# Patient Record
Sex: Male | Born: 1940
Health system: Southern US, Community
[De-identification: ages and names within clinical notes are randomized; demographics above are authoritative.]

## PROBLEM LIST (undated history)

## (undated) DIAGNOSIS — I10 Essential (primary) hypertension: Secondary | ICD-10-CM

## (undated) DIAGNOSIS — E785 Hyperlipidemia, unspecified: Secondary | ICD-10-CM

## (undated) DIAGNOSIS — E538 Deficiency of other specified B group vitamins: Secondary | ICD-10-CM

## (undated) DIAGNOSIS — K219 Gastro-esophageal reflux disease without esophagitis: Secondary | ICD-10-CM

## (undated) DIAGNOSIS — N289 Disorder of kidney and ureter, unspecified: Secondary | ICD-10-CM

## (undated) DIAGNOSIS — N401 Enlarged prostate with lower urinary tract symptoms: Secondary | ICD-10-CM

## (undated) DIAGNOSIS — J302 Other seasonal allergic rhinitis: Secondary | ICD-10-CM

## (undated) DIAGNOSIS — G4730 Sleep apnea, unspecified: Secondary | ICD-10-CM

## (undated) DIAGNOSIS — D510 Vitamin B12 deficiency anemia due to intrinsic factor deficiency: Secondary | ICD-10-CM

## (undated) DIAGNOSIS — R0600 Dyspnea, unspecified: Secondary | ICD-10-CM

## (undated) DIAGNOSIS — D6489 Other specified anemias: Secondary | ICD-10-CM

## (undated) DIAGNOSIS — N138 Other obstructive and reflux uropathy: Secondary | ICD-10-CM

## (undated) DIAGNOSIS — R0609 Other forms of dyspnea: Secondary | ICD-10-CM

## (undated) HISTORY — DX: Vitamin B12 deficiency anemia due to intrinsic factor deficiency: D51.0

## (undated) HISTORY — DX: Benign prostatic hyperplasia with lower urinary tract symptoms: N13.8

## (undated) HISTORY — PX: MULTIPLE TOOTH EXTRACTIONS: SHX2053

## (undated) HISTORY — DX: Other obstructive and reflux uropathy: N40.1

## (undated) HISTORY — DX: Hyperlipidemia, unspecified: E78.5

## (undated) HISTORY — DX: Other seasonal allergic rhinitis: J30.2

## (undated) HISTORY — DX: Other forms of dyspnea: R06.09

## (undated) HISTORY — PX: HERNIA REPAIR: SHX51

## (undated) HISTORY — DX: Sleep apnea, unspecified: G47.30

## (undated) HISTORY — DX: Essential (primary) hypertension: I10

## (undated) HISTORY — PX: REPLACEMENT TOTAL KNEE: SUR1224

## (undated) HISTORY — DX: Other specified anemias: D64.89

## (undated) HISTORY — DX: Gastro-esophageal reflux disease without esophagitis: K21.9

## (undated) HISTORY — DX: Disorder of kidney and ureter, unspecified: N28.9

## (undated) HISTORY — DX: Dyspnea, unspecified: R06.00

## (undated) HISTORY — DX: Deficiency of other specified B group vitamins: E53.8

## (undated) HISTORY — PX: EYE SURGERY: SHX253

---

## 2011-12-28 DIAGNOSIS — D510 Vitamin B12 deficiency anemia due to intrinsic factor deficiency: Secondary | ICD-10-CM | POA: Diagnosis not present

## 2012-01-02 DIAGNOSIS — D509 Iron deficiency anemia, unspecified: Secondary | ICD-10-CM | POA: Diagnosis not present

## 2012-01-04 DIAGNOSIS — K2901 Acute gastritis with bleeding: Secondary | ICD-10-CM | POA: Diagnosis not present

## 2012-01-04 DIAGNOSIS — K589 Irritable bowel syndrome without diarrhea: Secondary | ICD-10-CM | POA: Diagnosis not present

## 2012-01-04 DIAGNOSIS — R1084 Generalized abdominal pain: Secondary | ICD-10-CM | POA: Diagnosis not present

## 2012-01-04 DIAGNOSIS — D509 Iron deficiency anemia, unspecified: Secondary | ICD-10-CM | POA: Diagnosis not present

## 2012-01-09 DIAGNOSIS — Z01818 Encounter for other preprocedural examination: Secondary | ICD-10-CM | POA: Diagnosis not present

## 2012-01-09 DIAGNOSIS — Z0389 Encounter for observation for other suspected diseases and conditions ruled out: Secondary | ICD-10-CM | POA: Diagnosis not present

## 2012-01-16 DIAGNOSIS — Z01811 Encounter for preprocedural respiratory examination: Secondary | ICD-10-CM | POA: Diagnosis not present

## 2012-01-16 DIAGNOSIS — Z23 Encounter for immunization: Secondary | ICD-10-CM | POA: Diagnosis not present

## 2012-01-16 DIAGNOSIS — Z01810 Encounter for preprocedural cardiovascular examination: Secondary | ICD-10-CM | POA: Diagnosis not present

## 2012-01-18 DIAGNOSIS — D509 Iron deficiency anemia, unspecified: Secondary | ICD-10-CM | POA: Diagnosis not present

## 2012-01-18 DIAGNOSIS — R195 Other fecal abnormalities: Secondary | ICD-10-CM | POA: Diagnosis not present

## 2012-01-27 DIAGNOSIS — D539 Nutritional anemia, unspecified: Secondary | ICD-10-CM | POA: Diagnosis not present

## 2012-02-06 DIAGNOSIS — H2510 Age-related nuclear cataract, unspecified eye: Secondary | ICD-10-CM | POA: Diagnosis not present

## 2012-02-16 DIAGNOSIS — I10 Essential (primary) hypertension: Secondary | ICD-10-CM | POA: Diagnosis not present

## 2012-02-16 DIAGNOSIS — E785 Hyperlipidemia, unspecified: Secondary | ICD-10-CM | POA: Diagnosis not present

## 2012-02-16 DIAGNOSIS — Z01810 Encounter for preprocedural cardiovascular examination: Secondary | ICD-10-CM | POA: Diagnosis not present

## 2012-02-21 DIAGNOSIS — I10 Essential (primary) hypertension: Secondary | ICD-10-CM | POA: Diagnosis not present

## 2012-02-21 DIAGNOSIS — G4733 Obstructive sleep apnea (adult) (pediatric): Secondary | ICD-10-CM | POA: Diagnosis not present

## 2012-02-22 DIAGNOSIS — G4733 Obstructive sleep apnea (adult) (pediatric): Secondary | ICD-10-CM | POA: Diagnosis not present

## 2012-02-24 DIAGNOSIS — D539 Nutritional anemia, unspecified: Secondary | ICD-10-CM | POA: Diagnosis not present

## 2012-04-04 DIAGNOSIS — I119 Hypertensive heart disease without heart failure: Secondary | ICD-10-CM | POA: Diagnosis not present

## 2012-04-04 DIAGNOSIS — Z125 Encounter for screening for malignant neoplasm of prostate: Secondary | ICD-10-CM | POA: Diagnosis not present

## 2012-04-04 DIAGNOSIS — E782 Mixed hyperlipidemia: Secondary | ICD-10-CM | POA: Diagnosis not present

## 2012-04-04 DIAGNOSIS — Z79899 Other long term (current) drug therapy: Secondary | ICD-10-CM | POA: Diagnosis not present

## 2012-04-04 DIAGNOSIS — D510 Vitamin B12 deficiency anemia due to intrinsic factor deficiency: Secondary | ICD-10-CM | POA: Diagnosis not present

## 2012-04-06 DIAGNOSIS — Z1211 Encounter for screening for malignant neoplasm of colon: Secondary | ICD-10-CM | POA: Diagnosis not present

## 2012-04-10 DIAGNOSIS — D649 Anemia, unspecified: Secondary | ICD-10-CM | POA: Diagnosis not present

## 2012-04-10 DIAGNOSIS — N039 Chronic nephritic syndrome with unspecified morphologic changes: Secondary | ICD-10-CM | POA: Diagnosis not present

## 2012-04-10 DIAGNOSIS — E785 Hyperlipidemia, unspecified: Secondary | ICD-10-CM | POA: Diagnosis not present

## 2012-04-10 DIAGNOSIS — G8918 Other acute postprocedural pain: Secondary | ICD-10-CM | POA: Diagnosis not present

## 2012-04-10 DIAGNOSIS — M1710 Unilateral primary osteoarthritis, unspecified knee: Secondary | ICD-10-CM | POA: Diagnosis not present

## 2012-04-10 DIAGNOSIS — I10 Essential (primary) hypertension: Secondary | ICD-10-CM | POA: Diagnosis not present

## 2012-04-10 DIAGNOSIS — G4733 Obstructive sleep apnea (adult) (pediatric): Secondary | ICD-10-CM | POA: Diagnosis present

## 2012-04-10 DIAGNOSIS — N401 Enlarged prostate with lower urinary tract symptoms: Secondary | ICD-10-CM | POA: Diagnosis not present

## 2012-04-10 DIAGNOSIS — N189 Chronic kidney disease, unspecified: Secondary | ICD-10-CM | POA: Diagnosis present

## 2012-04-10 DIAGNOSIS — N138 Other obstructive and reflux uropathy: Secondary | ICD-10-CM | POA: Diagnosis present

## 2012-04-10 DIAGNOSIS — G8929 Other chronic pain: Secondary | ICD-10-CM | POA: Diagnosis present

## 2012-04-10 DIAGNOSIS — T819XXA Unspecified complication of procedure, initial encounter: Secondary | ICD-10-CM | POA: Diagnosis not present

## 2012-04-10 DIAGNOSIS — I129 Hypertensive chronic kidney disease with stage 1 through stage 4 chronic kidney disease, or unspecified chronic kidney disease: Secondary | ICD-10-CM | POA: Diagnosis present

## 2012-04-10 DIAGNOSIS — E7800 Pure hypercholesterolemia, unspecified: Secondary | ICD-10-CM | POA: Diagnosis not present

## 2012-04-10 DIAGNOSIS — R109 Unspecified abdominal pain: Secondary | ICD-10-CM | POA: Diagnosis present

## 2012-04-10 DIAGNOSIS — R339 Retention of urine, unspecified: Secondary | ICD-10-CM | POA: Diagnosis not present

## 2012-04-10 DIAGNOSIS — K219 Gastro-esophageal reflux disease without esophagitis: Secondary | ICD-10-CM | POA: Diagnosis not present

## 2012-04-10 DIAGNOSIS — N182 Chronic kidney disease, stage 2 (mild): Secondary | ICD-10-CM | POA: Diagnosis not present

## 2012-04-10 DIAGNOSIS — M25569 Pain in unspecified knee: Secondary | ICD-10-CM | POA: Diagnosis not present

## 2012-04-10 DIAGNOSIS — D631 Anemia in chronic kidney disease: Secondary | ICD-10-CM | POA: Diagnosis not present

## 2012-04-13 DIAGNOSIS — I10 Essential (primary) hypertension: Secondary | ICD-10-CM | POA: Diagnosis not present

## 2012-04-13 DIAGNOSIS — Z471 Aftercare following joint replacement surgery: Secondary | ICD-10-CM | POA: Diagnosis not present

## 2012-04-13 DIAGNOSIS — R269 Unspecified abnormalities of gait and mobility: Secondary | ICD-10-CM | POA: Diagnosis not present

## 2012-04-13 DIAGNOSIS — R339 Retention of urine, unspecified: Secondary | ICD-10-CM | POA: Diagnosis not present

## 2012-04-13 DIAGNOSIS — Z96659 Presence of unspecified artificial knee joint: Secondary | ICD-10-CM | POA: Diagnosis not present

## 2012-04-14 DIAGNOSIS — Z96659 Presence of unspecified artificial knee joint: Secondary | ICD-10-CM | POA: Diagnosis not present

## 2012-04-14 DIAGNOSIS — I10 Essential (primary) hypertension: Secondary | ICD-10-CM | POA: Diagnosis not present

## 2012-04-14 DIAGNOSIS — R339 Retention of urine, unspecified: Secondary | ICD-10-CM | POA: Diagnosis not present

## 2012-04-14 DIAGNOSIS — Z471 Aftercare following joint replacement surgery: Secondary | ICD-10-CM | POA: Diagnosis not present

## 2012-04-14 DIAGNOSIS — R269 Unspecified abnormalities of gait and mobility: Secondary | ICD-10-CM | POA: Diagnosis not present

## 2012-04-16 DIAGNOSIS — I10 Essential (primary) hypertension: Secondary | ICD-10-CM | POA: Diagnosis not present

## 2012-04-16 DIAGNOSIS — R269 Unspecified abnormalities of gait and mobility: Secondary | ICD-10-CM | POA: Diagnosis not present

## 2012-04-16 DIAGNOSIS — Z96659 Presence of unspecified artificial knee joint: Secondary | ICD-10-CM | POA: Diagnosis not present

## 2012-04-16 DIAGNOSIS — Z471 Aftercare following joint replacement surgery: Secondary | ICD-10-CM | POA: Diagnosis not present

## 2012-04-16 DIAGNOSIS — R339 Retention of urine, unspecified: Secondary | ICD-10-CM | POA: Diagnosis not present

## 2012-04-18 DIAGNOSIS — I10 Essential (primary) hypertension: Secondary | ICD-10-CM | POA: Diagnosis not present

## 2012-04-18 DIAGNOSIS — R339 Retention of urine, unspecified: Secondary | ICD-10-CM | POA: Diagnosis not present

## 2012-04-18 DIAGNOSIS — R269 Unspecified abnormalities of gait and mobility: Secondary | ICD-10-CM | POA: Diagnosis not present

## 2012-04-18 DIAGNOSIS — Z471 Aftercare following joint replacement surgery: Secondary | ICD-10-CM | POA: Diagnosis not present

## 2012-04-18 DIAGNOSIS — Z96659 Presence of unspecified artificial knee joint: Secondary | ICD-10-CM | POA: Diagnosis not present

## 2012-04-19 DIAGNOSIS — R339 Retention of urine, unspecified: Secondary | ICD-10-CM | POA: Diagnosis not present

## 2012-04-19 DIAGNOSIS — Z471 Aftercare following joint replacement surgery: Secondary | ICD-10-CM | POA: Diagnosis not present

## 2012-04-19 DIAGNOSIS — I10 Essential (primary) hypertension: Secondary | ICD-10-CM | POA: Diagnosis not present

## 2012-04-19 DIAGNOSIS — N318 Other neuromuscular dysfunction of bladder: Secondary | ICD-10-CM | POA: Diagnosis not present

## 2012-04-19 DIAGNOSIS — N390 Urinary tract infection, site not specified: Secondary | ICD-10-CM | POA: Diagnosis not present

## 2012-04-19 DIAGNOSIS — N401 Enlarged prostate with lower urinary tract symptoms: Secondary | ICD-10-CM | POA: Diagnosis not present

## 2012-04-19 DIAGNOSIS — R269 Unspecified abnormalities of gait and mobility: Secondary | ICD-10-CM | POA: Diagnosis not present

## 2012-04-19 DIAGNOSIS — Z96659 Presence of unspecified artificial knee joint: Secondary | ICD-10-CM | POA: Diagnosis not present

## 2012-04-20 DIAGNOSIS — I10 Essential (primary) hypertension: Secondary | ICD-10-CM | POA: Diagnosis not present

## 2012-04-20 DIAGNOSIS — R339 Retention of urine, unspecified: Secondary | ICD-10-CM | POA: Diagnosis not present

## 2012-04-20 DIAGNOSIS — Z471 Aftercare following joint replacement surgery: Secondary | ICD-10-CM | POA: Diagnosis not present

## 2012-04-20 DIAGNOSIS — R269 Unspecified abnormalities of gait and mobility: Secondary | ICD-10-CM | POA: Diagnosis not present

## 2012-04-20 DIAGNOSIS — Z96659 Presence of unspecified artificial knee joint: Secondary | ICD-10-CM | POA: Diagnosis not present

## 2012-04-23 DIAGNOSIS — M25569 Pain in unspecified knee: Secondary | ICD-10-CM | POA: Diagnosis not present

## 2012-04-25 DIAGNOSIS — M25569 Pain in unspecified knee: Secondary | ICD-10-CM | POA: Diagnosis not present

## 2012-04-26 DIAGNOSIS — R339 Retention of urine, unspecified: Secondary | ICD-10-CM | POA: Diagnosis not present

## 2012-04-26 DIAGNOSIS — N401 Enlarged prostate with lower urinary tract symptoms: Secondary | ICD-10-CM | POA: Diagnosis not present

## 2012-04-26 DIAGNOSIS — N390 Urinary tract infection, site not specified: Secondary | ICD-10-CM | POA: Diagnosis not present

## 2012-04-26 DIAGNOSIS — D510 Vitamin B12 deficiency anemia due to intrinsic factor deficiency: Secondary | ICD-10-CM | POA: Diagnosis not present

## 2012-04-26 DIAGNOSIS — N318 Other neuromuscular dysfunction of bladder: Secondary | ICD-10-CM | POA: Diagnosis not present

## 2012-04-27 DIAGNOSIS — M25569 Pain in unspecified knee: Secondary | ICD-10-CM | POA: Diagnosis not present

## 2012-04-29 DIAGNOSIS — N390 Urinary tract infection, site not specified: Secondary | ICD-10-CM | POA: Diagnosis not present

## 2012-05-01 DIAGNOSIS — M25569 Pain in unspecified knee: Secondary | ICD-10-CM | POA: Diagnosis not present

## 2012-05-02 DIAGNOSIS — M25569 Pain in unspecified knee: Secondary | ICD-10-CM | POA: Diagnosis not present

## 2012-05-04 DIAGNOSIS — N390 Urinary tract infection, site not specified: Secondary | ICD-10-CM | POA: Diagnosis not present

## 2012-05-04 DIAGNOSIS — R339 Retention of urine, unspecified: Secondary | ICD-10-CM | POA: Diagnosis not present

## 2012-05-04 DIAGNOSIS — N401 Enlarged prostate with lower urinary tract symptoms: Secondary | ICD-10-CM | POA: Diagnosis not present

## 2012-05-04 DIAGNOSIS — M25569 Pain in unspecified knee: Secondary | ICD-10-CM | POA: Diagnosis not present

## 2012-05-04 DIAGNOSIS — N318 Other neuromuscular dysfunction of bladder: Secondary | ICD-10-CM | POA: Diagnosis not present

## 2012-05-07 DIAGNOSIS — M25569 Pain in unspecified knee: Secondary | ICD-10-CM | POA: Diagnosis not present

## 2012-05-08 DIAGNOSIS — I209 Angina pectoris, unspecified: Secondary | ICD-10-CM | POA: Diagnosis not present

## 2012-05-08 DIAGNOSIS — I5030 Unspecified diastolic (congestive) heart failure: Secondary | ICD-10-CM | POA: Diagnosis not present

## 2012-05-08 DIAGNOSIS — N400 Enlarged prostate without lower urinary tract symptoms: Secondary | ICD-10-CM | POA: Diagnosis not present

## 2012-05-08 DIAGNOSIS — D649 Anemia, unspecified: Secondary | ICD-10-CM | POA: Diagnosis not present

## 2012-05-08 DIAGNOSIS — Z0389 Encounter for observation for other suspected diseases and conditions ruled out: Secondary | ICD-10-CM | POA: Diagnosis not present

## 2012-05-08 DIAGNOSIS — Z96659 Presence of unspecified artificial knee joint: Secondary | ICD-10-CM | POA: Diagnosis not present

## 2012-05-08 DIAGNOSIS — I130 Hypertensive heart and chronic kidney disease with heart failure and stage 1 through stage 4 chronic kidney disease, or unspecified chronic kidney disease: Secondary | ICD-10-CM | POA: Diagnosis not present

## 2012-05-08 DIAGNOSIS — R0989 Other specified symptoms and signs involving the circulatory and respiratory systems: Secondary | ICD-10-CM | POA: Diagnosis not present

## 2012-05-08 DIAGNOSIS — R0602 Shortness of breath: Secondary | ICD-10-CM | POA: Diagnosis not present

## 2012-05-08 DIAGNOSIS — I129 Hypertensive chronic kidney disease with stage 1 through stage 4 chronic kidney disease, or unspecified chronic kidney disease: Secondary | ICD-10-CM | POA: Diagnosis not present

## 2012-05-08 DIAGNOSIS — R079 Chest pain, unspecified: Secondary | ICD-10-CM | POA: Diagnosis not present

## 2012-05-08 DIAGNOSIS — K219 Gastro-esophageal reflux disease without esophagitis: Secondary | ICD-10-CM | POA: Diagnosis not present

## 2012-05-08 DIAGNOSIS — N189 Chronic kidney disease, unspecified: Secondary | ICD-10-CM | POA: Diagnosis not present

## 2012-05-08 DIAGNOSIS — N179 Acute kidney failure, unspecified: Secondary | ICD-10-CM | POA: Diagnosis not present

## 2012-05-08 DIAGNOSIS — I059 Rheumatic mitral valve disease, unspecified: Secondary | ICD-10-CM | POA: Diagnosis not present

## 2012-05-08 DIAGNOSIS — E785 Hyperlipidemia, unspecified: Secondary | ICD-10-CM | POA: Diagnosis not present

## 2012-05-08 DIAGNOSIS — G4730 Sleep apnea, unspecified: Secondary | ICD-10-CM | POA: Diagnosis not present

## 2012-05-08 DIAGNOSIS — I509 Heart failure, unspecified: Secondary | ICD-10-CM | POA: Diagnosis not present

## 2012-05-08 DIAGNOSIS — I079 Rheumatic tricuspid valve disease, unspecified: Secondary | ICD-10-CM | POA: Diagnosis not present

## 2012-05-08 DIAGNOSIS — R000 Tachycardia, unspecified: Secondary | ICD-10-CM | POA: Diagnosis not present

## 2012-05-09 DIAGNOSIS — Z0389 Encounter for observation for other suspected diseases and conditions ruled out: Secondary | ICD-10-CM | POA: Diagnosis not present

## 2012-05-09 DIAGNOSIS — R079 Chest pain, unspecified: Secondary | ICD-10-CM | POA: Diagnosis not present

## 2012-05-09 DIAGNOSIS — I219 Acute myocardial infarction, unspecified: Secondary | ICD-10-CM | POA: Diagnosis not present

## 2012-05-09 DIAGNOSIS — N039 Chronic nephritic syndrome with unspecified morphologic changes: Secondary | ICD-10-CM | POA: Diagnosis not present

## 2012-05-09 DIAGNOSIS — N179 Acute kidney failure, unspecified: Secondary | ICD-10-CM | POA: Diagnosis not present

## 2012-05-09 DIAGNOSIS — I359 Nonrheumatic aortic valve disorder, unspecified: Secondary | ICD-10-CM | POA: Diagnosis not present

## 2012-05-09 DIAGNOSIS — E785 Hyperlipidemia, unspecified: Secondary | ICD-10-CM | POA: Diagnosis not present

## 2012-05-10 DIAGNOSIS — N179 Acute kidney failure, unspecified: Secondary | ICD-10-CM | POA: Diagnosis not present

## 2012-05-10 DIAGNOSIS — R079 Chest pain, unspecified: Secondary | ICD-10-CM | POA: Diagnosis not present

## 2012-05-10 DIAGNOSIS — E785 Hyperlipidemia, unspecified: Secondary | ICD-10-CM | POA: Diagnosis not present

## 2012-05-10 DIAGNOSIS — I130 Hypertensive heart and chronic kidney disease with heart failure and stage 1 through stage 4 chronic kidney disease, or unspecified chronic kidney disease: Secondary | ICD-10-CM | POA: Diagnosis not present

## 2012-05-10 DIAGNOSIS — N039 Chronic nephritic syndrome with unspecified morphologic changes: Secondary | ICD-10-CM | POA: Diagnosis not present

## 2012-05-10 DIAGNOSIS — R0789 Other chest pain: Secondary | ICD-10-CM | POA: Diagnosis not present

## 2012-05-15 DIAGNOSIS — M25569 Pain in unspecified knee: Secondary | ICD-10-CM | POA: Diagnosis not present

## 2012-05-17 DIAGNOSIS — M25569 Pain in unspecified knee: Secondary | ICD-10-CM | POA: Diagnosis not present

## 2012-05-24 DIAGNOSIS — M25569 Pain in unspecified knee: Secondary | ICD-10-CM | POA: Diagnosis not present

## 2012-05-24 DIAGNOSIS — Z96659 Presence of unspecified artificial knee joint: Secondary | ICD-10-CM | POA: Diagnosis not present

## 2012-05-25 DIAGNOSIS — M25569 Pain in unspecified knee: Secondary | ICD-10-CM | POA: Diagnosis not present

## 2012-05-25 DIAGNOSIS — G4733 Obstructive sleep apnea (adult) (pediatric): Secondary | ICD-10-CM | POA: Diagnosis not present

## 2012-05-25 DIAGNOSIS — R000 Tachycardia, unspecified: Secondary | ICD-10-CM | POA: Diagnosis not present

## 2012-05-25 DIAGNOSIS — R0682 Tachypnea, not elsewhere classified: Secondary | ICD-10-CM | POA: Diagnosis not present

## 2012-05-29 DIAGNOSIS — M25569 Pain in unspecified knee: Secondary | ICD-10-CM | POA: Diagnosis not present

## 2012-05-31 DIAGNOSIS — M25569 Pain in unspecified knee: Secondary | ICD-10-CM | POA: Diagnosis not present

## 2012-06-01 DIAGNOSIS — N318 Other neuromuscular dysfunction of bladder: Secondary | ICD-10-CM | POA: Diagnosis not present

## 2012-06-01 DIAGNOSIS — N401 Enlarged prostate with lower urinary tract symptoms: Secondary | ICD-10-CM | POA: Diagnosis not present

## 2012-06-01 DIAGNOSIS — R339 Retention of urine, unspecified: Secondary | ICD-10-CM | POA: Diagnosis not present

## 2012-06-01 DIAGNOSIS — N390 Urinary tract infection, site not specified: Secondary | ICD-10-CM | POA: Diagnosis not present

## 2012-06-05 DIAGNOSIS — M25569 Pain in unspecified knee: Secondary | ICD-10-CM | POA: Diagnosis not present

## 2012-06-08 DIAGNOSIS — M25569 Pain in unspecified knee: Secondary | ICD-10-CM | POA: Diagnosis not present

## 2012-06-12 DIAGNOSIS — M25569 Pain in unspecified knee: Secondary | ICD-10-CM | POA: Diagnosis not present

## 2012-06-14 DIAGNOSIS — M25569 Pain in unspecified knee: Secondary | ICD-10-CM | POA: Diagnosis not present

## 2012-06-19 DIAGNOSIS — M25569 Pain in unspecified knee: Secondary | ICD-10-CM | POA: Diagnosis not present

## 2012-06-25 DIAGNOSIS — D510 Vitamin B12 deficiency anemia due to intrinsic factor deficiency: Secondary | ICD-10-CM | POA: Diagnosis not present

## 2012-07-02 DIAGNOSIS — D509 Iron deficiency anemia, unspecified: Secondary | ICD-10-CM | POA: Diagnosis not present

## 2012-07-02 DIAGNOSIS — N400 Enlarged prostate without lower urinary tract symptoms: Secondary | ICD-10-CM | POA: Diagnosis not present

## 2012-07-02 DIAGNOSIS — B3781 Candidal esophagitis: Secondary | ICD-10-CM | POA: Diagnosis not present

## 2012-07-02 DIAGNOSIS — N401 Enlarged prostate with lower urinary tract symptoms: Secondary | ICD-10-CM | POA: Diagnosis not present

## 2012-07-02 DIAGNOSIS — J029 Acute pharyngitis, unspecified: Secondary | ICD-10-CM | POA: Diagnosis not present

## 2012-07-02 DIAGNOSIS — R339 Retention of urine, unspecified: Secondary | ICD-10-CM | POA: Diagnosis not present

## 2012-07-04 DIAGNOSIS — D649 Anemia, unspecified: Secondary | ICD-10-CM | POA: Diagnosis not present

## 2012-07-09 DIAGNOSIS — D509 Iron deficiency anemia, unspecified: Secondary | ICD-10-CM | POA: Diagnosis not present

## 2012-07-09 DIAGNOSIS — R195 Other fecal abnormalities: Secondary | ICD-10-CM | POA: Diagnosis not present

## 2012-07-26 DIAGNOSIS — D510 Vitamin B12 deficiency anemia due to intrinsic factor deficiency: Secondary | ICD-10-CM | POA: Diagnosis not present

## 2012-07-26 DIAGNOSIS — G4733 Obstructive sleep apnea (adult) (pediatric): Secondary | ICD-10-CM | POA: Diagnosis not present

## 2012-07-26 DIAGNOSIS — K219 Gastro-esophageal reflux disease without esophagitis: Secondary | ICD-10-CM | POA: Diagnosis not present

## 2012-08-09 DIAGNOSIS — D509 Iron deficiency anemia, unspecified: Secondary | ICD-10-CM | POA: Diagnosis not present

## 2012-08-28 DIAGNOSIS — D510 Vitamin B12 deficiency anemia due to intrinsic factor deficiency: Secondary | ICD-10-CM | POA: Diagnosis not present

## 2012-08-28 DIAGNOSIS — Z23 Encounter for immunization: Secondary | ICD-10-CM | POA: Diagnosis not present

## 2012-10-01 DIAGNOSIS — N390 Urinary tract infection, site not specified: Secondary | ICD-10-CM | POA: Diagnosis not present

## 2012-10-01 DIAGNOSIS — N318 Other neuromuscular dysfunction of bladder: Secondary | ICD-10-CM | POA: Diagnosis not present

## 2012-10-01 DIAGNOSIS — R339 Retention of urine, unspecified: Secondary | ICD-10-CM | POA: Diagnosis not present

## 2012-10-01 DIAGNOSIS — N401 Enlarged prostate with lower urinary tract symptoms: Secondary | ICD-10-CM | POA: Diagnosis not present

## 2012-10-01 DIAGNOSIS — D510 Vitamin B12 deficiency anemia due to intrinsic factor deficiency: Secondary | ICD-10-CM | POA: Diagnosis not present

## 2012-10-12 DIAGNOSIS — D509 Iron deficiency anemia, unspecified: Secondary | ICD-10-CM | POA: Diagnosis not present

## 2012-10-24 DIAGNOSIS — G4733 Obstructive sleep apnea (adult) (pediatric): Secondary | ICD-10-CM | POA: Diagnosis not present

## 2012-10-24 DIAGNOSIS — K219 Gastro-esophageal reflux disease without esophagitis: Secondary | ICD-10-CM | POA: Diagnosis not present

## 2012-10-24 DIAGNOSIS — I119 Hypertensive heart disease without heart failure: Secondary | ICD-10-CM | POA: Diagnosis not present

## 2012-10-24 DIAGNOSIS — E782 Mixed hyperlipidemia: Secondary | ICD-10-CM | POA: Diagnosis not present

## 2012-12-07 DIAGNOSIS — R269 Unspecified abnormalities of gait and mobility: Secondary | ICD-10-CM | POA: Diagnosis not present

## 2012-12-07 DIAGNOSIS — Z96659 Presence of unspecified artificial knee joint: Secondary | ICD-10-CM | POA: Diagnosis not present

## 2012-12-07 DIAGNOSIS — M25569 Pain in unspecified knee: Secondary | ICD-10-CM | POA: Diagnosis not present

## 2012-12-07 DIAGNOSIS — M6281 Muscle weakness (generalized): Secondary | ICD-10-CM | POA: Diagnosis not present

## 2012-12-24 DIAGNOSIS — D510 Vitamin B12 deficiency anemia due to intrinsic factor deficiency: Secondary | ICD-10-CM | POA: Diagnosis not present

## 2012-12-31 DIAGNOSIS — D509 Iron deficiency anemia, unspecified: Secondary | ICD-10-CM | POA: Diagnosis not present

## 2012-12-31 DIAGNOSIS — R339 Retention of urine, unspecified: Secondary | ICD-10-CM | POA: Diagnosis not present

## 2012-12-31 DIAGNOSIS — N400 Enlarged prostate without lower urinary tract symptoms: Secondary | ICD-10-CM | POA: Diagnosis not present

## 2012-12-31 DIAGNOSIS — N318 Other neuromuscular dysfunction of bladder: Secondary | ICD-10-CM | POA: Diagnosis not present

## 2013-01-03 DIAGNOSIS — H35039 Hypertensive retinopathy, unspecified eye: Secondary | ICD-10-CM | POA: Diagnosis not present

## 2013-01-25 DIAGNOSIS — D510 Vitamin B12 deficiency anemia due to intrinsic factor deficiency: Secondary | ICD-10-CM | POA: Diagnosis not present

## 2013-01-29 DIAGNOSIS — H2510 Age-related nuclear cataract, unspecified eye: Secondary | ICD-10-CM | POA: Diagnosis not present

## 2013-02-01 DIAGNOSIS — R195 Other fecal abnormalities: Secondary | ICD-10-CM | POA: Diagnosis not present

## 2013-02-05 DIAGNOSIS — K2960 Other gastritis without bleeding: Secondary | ICD-10-CM | POA: Diagnosis not present

## 2013-02-05 DIAGNOSIS — D509 Iron deficiency anemia, unspecified: Secondary | ICD-10-CM | POA: Diagnosis not present

## 2013-02-05 DIAGNOSIS — R195 Other fecal abnormalities: Secondary | ICD-10-CM | POA: Diagnosis not present

## 2013-02-13 DIAGNOSIS — H2510 Age-related nuclear cataract, unspecified eye: Secondary | ICD-10-CM | POA: Diagnosis not present

## 2013-02-13 DIAGNOSIS — H52209 Unspecified astigmatism, unspecified eye: Secondary | ICD-10-CM | POA: Diagnosis not present

## 2013-02-13 DIAGNOSIS — H2589 Other age-related cataract: Secondary | ICD-10-CM | POA: Diagnosis not present

## 2013-02-22 DIAGNOSIS — D510 Vitamin B12 deficiency anemia due to intrinsic factor deficiency: Secondary | ICD-10-CM | POA: Diagnosis not present

## 2013-03-26 DIAGNOSIS — D510 Vitamin B12 deficiency anemia due to intrinsic factor deficiency: Secondary | ICD-10-CM | POA: Diagnosis not present

## 2013-04-01 DIAGNOSIS — N400 Enlarged prostate without lower urinary tract symptoms: Secondary | ICD-10-CM | POA: Diagnosis not present

## 2013-04-01 DIAGNOSIS — N390 Urinary tract infection, site not specified: Secondary | ICD-10-CM | POA: Diagnosis not present

## 2013-04-01 DIAGNOSIS — N318 Other neuromuscular dysfunction of bladder: Secondary | ICD-10-CM | POA: Diagnosis not present

## 2013-04-01 DIAGNOSIS — R339 Retention of urine, unspecified: Secondary | ICD-10-CM | POA: Diagnosis not present

## 2013-04-02 DIAGNOSIS — H02409 Unspecified ptosis of unspecified eyelid: Secondary | ICD-10-CM | POA: Diagnosis not present

## 2013-04-04 DIAGNOSIS — H02049 Spastic entropion of unspecified eye, unspecified eyelid: Secondary | ICD-10-CM | POA: Diagnosis not present

## 2013-04-08 DIAGNOSIS — D510 Vitamin B12 deficiency anemia due to intrinsic factor deficiency: Secondary | ICD-10-CM | POA: Diagnosis not present

## 2013-04-08 DIAGNOSIS — E782 Mixed hyperlipidemia: Secondary | ICD-10-CM | POA: Diagnosis not present

## 2013-04-08 DIAGNOSIS — Z125 Encounter for screening for malignant neoplasm of prostate: Secondary | ICD-10-CM | POA: Diagnosis not present

## 2013-04-08 DIAGNOSIS — I119 Hypertensive heart disease without heart failure: Secondary | ICD-10-CM | POA: Diagnosis not present

## 2013-04-08 DIAGNOSIS — I10 Essential (primary) hypertension: Secondary | ICD-10-CM | POA: Diagnosis not present

## 2013-04-08 DIAGNOSIS — Z79899 Other long term (current) drug therapy: Secondary | ICD-10-CM | POA: Diagnosis not present

## 2013-04-08 DIAGNOSIS — E291 Testicular hypofunction: Secondary | ICD-10-CM | POA: Diagnosis not present

## 2013-04-19 DIAGNOSIS — M6281 Muscle weakness (generalized): Secondary | ICD-10-CM | POA: Diagnosis not present

## 2013-04-19 DIAGNOSIS — Z96659 Presence of unspecified artificial knee joint: Secondary | ICD-10-CM | POA: Diagnosis not present

## 2013-04-26 DIAGNOSIS — D510 Vitamin B12 deficiency anemia due to intrinsic factor deficiency: Secondary | ICD-10-CM | POA: Diagnosis not present

## 2013-04-30 ENCOUNTER — Encounter: Payer: Self-pay | Admitting: Pulmonary Disease

## 2013-04-30 ENCOUNTER — Ambulatory Visit (INDEPENDENT_AMBULATORY_CARE_PROVIDER_SITE_OTHER): Payer: Medicare Other | Admitting: Pulmonary Disease

## 2013-04-30 VITALS — BP 126/70 | HR 59 | Temp 97.0°F | Ht 70.0 in | Wt 214.6 lb

## 2013-04-30 DIAGNOSIS — G4733 Obstructive sleep apnea (adult) (pediatric): Secondary | ICD-10-CM | POA: Insufficient documentation

## 2013-04-30 HISTORY — DX: Obstructive sleep apnea (adult) (pediatric): G47.33

## 2013-04-30 NOTE — Patient Instructions (Addendum)
Work on weight loss for now.  If you feel this is negatively impacting your quality of life, could consider dental appliance or nasal surgery followed by another trial of cpap.  Please let me know if you would like to treat this more aggressively.

## 2013-04-30 NOTE — Progress Notes (Signed)
Subjective:    Patient ID: Vincent Wiley, male    DOB: Feb 23, 1941, 72 y.o.   MRN: [id number]  HPI The patient is a 72 year old male who I've been asked to see for management of obstructive sleep apnea.  He underwent a sleep study last year, and was found to have moderate disease with an AHI of 27 events per hour.  He was started on CPAP, but had issues tolerating the expiratory pressures.  He was then tried on bilevel with significant improvement, and then an autotitrating device.  He did better with the last 2 devices, but continued to have nasal congestion that led to an inability to breathe through his nose.  He is not willing to try a full face mask because of claustrophobia issues.  He states that his nose gets congested every night, and is improved intermittently with a breathe right strip.  He does use heated humidification on his devices at that time.  The patient currently sleeps alone, and does not feel that he has significant fragmented sleep.  He feels fairly rested in the mornings upon arising, and has very little sleepiness during the day even with quiet inactivity.  He has some intermittent sleepiness in the evenings watching television, but is not a significant issue for him.  He denies any sleepiness with driving.  He does have a history of hypertension, but no significant cardiac issues.  His Epworth sleepiness score today is only 4.    Sleep Questionnaire What time do you typically go to bed?( Between what hours) 9-10p 9-10p at 0903 on 04/30/13 by Nita Sells, CMA How long does it take you to fall asleep? 5-72mins 5-46mins at 0903 on 04/30/13 by Nita Sells, CMA How many times during the night do you wake up? 4 4 at 0903 on 04/30/13 by Nita Sells, CMA What time do you get out of bed to start your day? [id number] 430-5a at 0903 on 04/30/13 by Nita Sells, CMA Do you drive or operate heavy machinery in your occupation? No No at 0903 on 04/30/13 by Nita Sells, CMA  How much has your weight changed (up or down) over the past two years? (In pounds) 10 lb (4.536 kg)10 lb (4.536 kg) +/- varies 10lbs at 0903 on 04/30/13 by Nita Sells, CMA Have you ever had a sleep study before? Yes Yes at 0903 on 04/30/13 by Nita Sells, CMA If yes, location of study? [facility] at [phone] on 04/30/13 by Nita Sells, CMA If yes, date of study? 12/2011 12/2011 at 0903 on 04/30/13 by Nita Sells, CMA Do you currently use CPAP? No No at 0903 on 04/30/13 by Marjo Bicker Mabe, CMA Do you wear oxygen at any time? No No at 0903 on 04/30/13 by Nita Sells, CMA   Review of Systems  Constitutional: Negative for fever and unexpected weight change.  HENT: Positive for rhinorrhea. Negative for ear pain, nosebleeds, congestion, sore throat, sneezing, trouble swallowing, dental problem, postnasal drip and sinus pressure.        Allergies   Eyes: Negative for redness and itching.  Respiratory: Negative for cough, chest tightness, shortness of breath and wheezing.   Cardiovascular: Negative for palpitations and leg swelling.  Gastrointestinal: Negative for nausea and vomiting.  Genitourinary: Positive for difficulty urinating. Negative for dysuria.  Musculoskeletal: Negative for joint swelling.  Skin: Negative for rash.  Neurological: Positive for headaches.  Hematological: Does not bruise/bleed easily.  Psychiatric/Behavioral: Negative for dysphoric  mood. The patient is not nervous/anxious.        Objective:   Physical Exam Constitutional:  Well developed, no acute distress  HENT:  Nares patent without discharge, but deviated septum to right with narrowing.  Oropharynx without exudate, palate and uvula are moderately elongated.   Eyes:  Right pupil unresponsive, left normal, eomi, no scleral icterus  Neck:  No JVD, no TMG  Cardiovascular:  Normal rate, regular rhythm, no rubs or gallops.  No murmurs        Intact distal  pulses  Pulmonary :  Normal breath sounds, no stridor or respiratory distress   No rales, rhonchi, or wheezing  Abdominal:  Soft, nondistended, bowel sounds present.  No tenderness noted.   Musculoskeletal:  1+ lower extremity edema noted.  Lymph Nodes:  No cervical lymphadenopathy noted  Skin:  No cyanosis noted  Neurologic:  Alert, appropriate, moves all 4 extremities without obvious deficit.         Assessment & Plan:

## 2013-04-30 NOTE — Assessment & Plan Note (Signed)
The patient has moderate obstructive sleep apnea by his sleep study last year, but has failed CPAP/BiPAP/automatic device despite giving great effort.  A lot of his issues has to do with nasal obstruction, even using heated humidification.  One option would be nasal surgery, followed by another trial of CPAP.  I also discussed with him the possibility of a dental appliance, but I have concerns about his nasal congestion while wearing this.  Finally, the patient could simply work aggressively on weight reduction given that he does not have a lot of underlying medical issues and is really not very symptomatic.  After a long discussion with the patient, he would like to try working on weight loss for a period of time.  I have explained to him that moderate disease does not have an overly significant impact on cardiovascular health.

## 2013-05-08 DIAGNOSIS — H02049 Spastic entropion of unspecified eye, unspecified eyelid: Secondary | ICD-10-CM | POA: Diagnosis not present

## 2013-05-27 DIAGNOSIS — D510 Vitamin B12 deficiency anemia due to intrinsic factor deficiency: Secondary | ICD-10-CM | POA: Diagnosis not present

## 2013-06-04 DIAGNOSIS — D509 Iron deficiency anemia, unspecified: Secondary | ICD-10-CM | POA: Diagnosis not present

## 2013-07-01 DIAGNOSIS — N401 Enlarged prostate with lower urinary tract symptoms: Secondary | ICD-10-CM | POA: Diagnosis not present

## 2013-07-01 DIAGNOSIS — R339 Retention of urine, unspecified: Secondary | ICD-10-CM | POA: Diagnosis not present

## 2013-07-01 DIAGNOSIS — D510 Vitamin B12 deficiency anemia due to intrinsic factor deficiency: Secondary | ICD-10-CM | POA: Diagnosis not present

## 2013-07-26 DIAGNOSIS — D510 Vitamin B12 deficiency anemia due to intrinsic factor deficiency: Secondary | ICD-10-CM | POA: Diagnosis not present

## 2013-08-27 DIAGNOSIS — D510 Vitamin B12 deficiency anemia due to intrinsic factor deficiency: Secondary | ICD-10-CM | POA: Diagnosis not present

## 2013-09-24 DIAGNOSIS — D510 Vitamin B12 deficiency anemia due to intrinsic factor deficiency: Secondary | ICD-10-CM | POA: Diagnosis not present

## 2013-09-24 DIAGNOSIS — Z23 Encounter for immunization: Secondary | ICD-10-CM | POA: Diagnosis not present

## 2013-09-30 DIAGNOSIS — D509 Iron deficiency anemia, unspecified: Secondary | ICD-10-CM | POA: Diagnosis not present

## 2013-10-25 DIAGNOSIS — D510 Vitamin B12 deficiency anemia due to intrinsic factor deficiency: Secondary | ICD-10-CM | POA: Diagnosis not present

## 2013-12-02 DIAGNOSIS — D510 Vitamin B12 deficiency anemia due to intrinsic factor deficiency: Secondary | ICD-10-CM | POA: Diagnosis not present

## 2013-12-05 DIAGNOSIS — G4733 Obstructive sleep apnea (adult) (pediatric): Secondary | ICD-10-CM | POA: Diagnosis not present

## 2013-12-05 DIAGNOSIS — D510 Vitamin B12 deficiency anemia due to intrinsic factor deficiency: Secondary | ICD-10-CM | POA: Diagnosis not present

## 2013-12-05 DIAGNOSIS — K219 Gastro-esophageal reflux disease without esophagitis: Secondary | ICD-10-CM | POA: Diagnosis not present

## 2013-12-05 DIAGNOSIS — E291 Testicular hypofunction: Secondary | ICD-10-CM | POA: Diagnosis not present

## 2013-12-05 DIAGNOSIS — Z1211 Encounter for screening for malignant neoplasm of colon: Secondary | ICD-10-CM | POA: Diagnosis not present

## 2013-12-05 DIAGNOSIS — E782 Mixed hyperlipidemia: Secondary | ICD-10-CM | POA: Diagnosis not present

## 2013-12-05 DIAGNOSIS — Z79899 Other long term (current) drug therapy: Secondary | ICD-10-CM | POA: Diagnosis not present

## 2013-12-06 DIAGNOSIS — K219 Gastro-esophageal reflux disease without esophagitis: Secondary | ICD-10-CM | POA: Diagnosis not present

## 2013-12-06 DIAGNOSIS — I10 Essential (primary) hypertension: Secondary | ICD-10-CM | POA: Diagnosis not present

## 2013-12-06 DIAGNOSIS — E782 Mixed hyperlipidemia: Secondary | ICD-10-CM | POA: Diagnosis not present

## 2013-12-30 DIAGNOSIS — N400 Enlarged prostate without lower urinary tract symptoms: Secondary | ICD-10-CM | POA: Diagnosis not present

## 2013-12-30 DIAGNOSIS — N318 Other neuromuscular dysfunction of bladder: Secondary | ICD-10-CM | POA: Diagnosis not present

## 2013-12-30 DIAGNOSIS — N390 Urinary tract infection, site not specified: Secondary | ICD-10-CM | POA: Diagnosis not present

## 2013-12-30 DIAGNOSIS — R339 Retention of urine, unspecified: Secondary | ICD-10-CM | POA: Diagnosis not present

## 2014-01-01 DIAGNOSIS — D510 Vitamin B12 deficiency anemia due to intrinsic factor deficiency: Secondary | ICD-10-CM | POA: Diagnosis not present

## 2014-01-28 DIAGNOSIS — D510 Vitamin B12 deficiency anemia due to intrinsic factor deficiency: Secondary | ICD-10-CM | POA: Diagnosis not present

## 2014-02-03 DIAGNOSIS — D509 Iron deficiency anemia, unspecified: Secondary | ICD-10-CM | POA: Diagnosis not present

## 2014-02-03 DIAGNOSIS — D500 Iron deficiency anemia secondary to blood loss (chronic): Secondary | ICD-10-CM | POA: Diagnosis not present

## 2014-02-12 DIAGNOSIS — R195 Other fecal abnormalities: Secondary | ICD-10-CM | POA: Diagnosis not present

## 2014-02-12 DIAGNOSIS — D509 Iron deficiency anemia, unspecified: Secondary | ICD-10-CM | POA: Diagnosis not present

## 2014-02-12 DIAGNOSIS — K2960 Other gastritis without bleeding: Secondary | ICD-10-CM | POA: Diagnosis not present

## 2014-02-24 DIAGNOSIS — D510 Vitamin B12 deficiency anemia due to intrinsic factor deficiency: Secondary | ICD-10-CM | POA: Diagnosis not present

## 2014-03-25 DIAGNOSIS — D510 Vitamin B12 deficiency anemia due to intrinsic factor deficiency: Secondary | ICD-10-CM | POA: Diagnosis not present

## 2014-04-09 DIAGNOSIS — M25569 Pain in unspecified knee: Secondary | ICD-10-CM | POA: Diagnosis not present

## 2014-04-10 DIAGNOSIS — E782 Mixed hyperlipidemia: Secondary | ICD-10-CM | POA: Diagnosis not present

## 2014-04-10 DIAGNOSIS — I119 Hypertensive heart disease without heart failure: Secondary | ICD-10-CM | POA: Diagnosis not present

## 2014-04-10 DIAGNOSIS — Z0000 Encounter for general adult medical examination without abnormal findings: Secondary | ICD-10-CM | POA: Diagnosis not present

## 2014-04-10 DIAGNOSIS — I10 Essential (primary) hypertension: Secondary | ICD-10-CM | POA: Diagnosis not present

## 2014-04-10 DIAGNOSIS — Z23 Encounter for immunization: Secondary | ICD-10-CM | POA: Diagnosis not present

## 2014-04-10 DIAGNOSIS — Z79899 Other long term (current) drug therapy: Secondary | ICD-10-CM | POA: Diagnosis not present

## 2014-04-10 DIAGNOSIS — D510 Vitamin B12 deficiency anemia due to intrinsic factor deficiency: Secondary | ICD-10-CM | POA: Diagnosis not present

## 2014-04-11 DIAGNOSIS — Z1211 Encounter for screening for malignant neoplasm of colon: Secondary | ICD-10-CM | POA: Diagnosis not present

## 2014-05-26 DIAGNOSIS — D510 Vitamin B12 deficiency anemia due to intrinsic factor deficiency: Secondary | ICD-10-CM | POA: Diagnosis not present

## 2014-05-30 DIAGNOSIS — D509 Iron deficiency anemia, unspecified: Secondary | ICD-10-CM | POA: Diagnosis not present

## 2014-06-18 DIAGNOSIS — J3089 Other allergic rhinitis: Secondary | ICD-10-CM | POA: Diagnosis not present

## 2014-06-25 DIAGNOSIS — D510 Vitamin B12 deficiency anemia due to intrinsic factor deficiency: Secondary | ICD-10-CM | POA: Diagnosis not present

## 2014-06-30 DIAGNOSIS — N401 Enlarged prostate with lower urinary tract symptoms: Secondary | ICD-10-CM | POA: Diagnosis not present

## 2014-06-30 DIAGNOSIS — N400 Enlarged prostate without lower urinary tract symptoms: Secondary | ICD-10-CM | POA: Diagnosis not present

## 2014-06-30 DIAGNOSIS — N138 Other obstructive and reflux uropathy: Secondary | ICD-10-CM | POA: Diagnosis not present

## 2014-07-28 DIAGNOSIS — D510 Vitamin B12 deficiency anemia due to intrinsic factor deficiency: Secondary | ICD-10-CM | POA: Diagnosis not present

## 2014-08-27 DIAGNOSIS — I10 Essential (primary) hypertension: Secondary | ICD-10-CM | POA: Diagnosis not present

## 2014-09-25 DIAGNOSIS — D510 Vitamin B12 deficiency anemia due to intrinsic factor deficiency: Secondary | ICD-10-CM | POA: Diagnosis not present

## 2014-10-27 DIAGNOSIS — D510 Vitamin B12 deficiency anemia due to intrinsic factor deficiency: Secondary | ICD-10-CM | POA: Diagnosis not present

## 2014-10-29 DIAGNOSIS — H608X3 Other otitis externa, bilateral: Secondary | ICD-10-CM | POA: Diagnosis not present

## 2014-10-29 DIAGNOSIS — H6123 Impacted cerumen, bilateral: Secondary | ICD-10-CM | POA: Diagnosis not present

## 2014-11-07 DIAGNOSIS — H608X3 Other otitis externa, bilateral: Secondary | ICD-10-CM | POA: Diagnosis not present

## 2014-12-12 DIAGNOSIS — D510 Vitamin B12 deficiency anemia due to intrinsic factor deficiency: Secondary | ICD-10-CM | POA: Diagnosis not present

## 2014-12-12 DIAGNOSIS — B369 Superficial mycosis, unspecified: Secondary | ICD-10-CM | POA: Diagnosis not present

## 2014-12-29 DIAGNOSIS — N400 Enlarged prostate without lower urinary tract symptoms: Secondary | ICD-10-CM | POA: Diagnosis not present

## 2015-01-01 DIAGNOSIS — D510 Vitamin B12 deficiency anemia due to intrinsic factor deficiency: Secondary | ICD-10-CM | POA: Diagnosis not present

## 2015-01-02 DIAGNOSIS — B369 Superficial mycosis, unspecified: Secondary | ICD-10-CM | POA: Diagnosis not present

## 2015-01-15 DIAGNOSIS — B369 Superficial mycosis, unspecified: Secondary | ICD-10-CM | POA: Diagnosis not present

## 2015-01-26 DIAGNOSIS — B369 Superficial mycosis, unspecified: Secondary | ICD-10-CM | POA: Diagnosis not present

## 2015-01-26 DIAGNOSIS — D509 Iron deficiency anemia, unspecified: Secondary | ICD-10-CM | POA: Diagnosis not present

## 2015-01-26 DIAGNOSIS — H60541 Acute eczematoid otitis externa, right ear: Secondary | ICD-10-CM | POA: Diagnosis not present

## 2015-01-26 DIAGNOSIS — D510 Vitamin B12 deficiency anemia due to intrinsic factor deficiency: Secondary | ICD-10-CM | POA: Diagnosis not present

## 2015-01-27 DIAGNOSIS — Z1212 Encounter for screening for malignant neoplasm of rectum: Secondary | ICD-10-CM | POA: Diagnosis not present

## 2015-01-29 DIAGNOSIS — K2960 Other gastritis without bleeding: Secondary | ICD-10-CM | POA: Diagnosis not present

## 2015-01-29 DIAGNOSIS — R195 Other fecal abnormalities: Secondary | ICD-10-CM | POA: Diagnosis not present

## 2015-01-29 DIAGNOSIS — K219 Gastro-esophageal reflux disease without esophagitis: Secondary | ICD-10-CM | POA: Diagnosis not present

## 2015-02-24 DIAGNOSIS — D510 Vitamin B12 deficiency anemia due to intrinsic factor deficiency: Secondary | ICD-10-CM | POA: Diagnosis not present

## 2015-03-05 DIAGNOSIS — H26492 Other secondary cataract, left eye: Secondary | ICD-10-CM | POA: Diagnosis not present

## 2015-03-05 DIAGNOSIS — H04123 Dry eye syndrome of bilateral lacrimal glands: Secondary | ICD-10-CM | POA: Diagnosis not present

## 2015-03-06 DIAGNOSIS — B369 Superficial mycosis, unspecified: Secondary | ICD-10-CM | POA: Diagnosis not present

## 2015-03-26 DIAGNOSIS — D510 Vitamin B12 deficiency anemia due to intrinsic factor deficiency: Secondary | ICD-10-CM | POA: Diagnosis not present

## 2015-04-27 DIAGNOSIS — Z125 Encounter for screening for malignant neoplasm of prostate: Secondary | ICD-10-CM | POA: Diagnosis not present

## 2015-04-27 DIAGNOSIS — D510 Vitamin B12 deficiency anemia due to intrinsic factor deficiency: Secondary | ICD-10-CM | POA: Diagnosis not present

## 2015-04-27 DIAGNOSIS — Z79899 Other long term (current) drug therapy: Secondary | ICD-10-CM | POA: Diagnosis not present

## 2015-04-27 DIAGNOSIS — I119 Hypertensive heart disease without heart failure: Secondary | ICD-10-CM | POA: Diagnosis not present

## 2015-04-27 DIAGNOSIS — Z0000 Encounter for general adult medical examination without abnormal findings: Secondary | ICD-10-CM | POA: Diagnosis not present

## 2015-04-27 DIAGNOSIS — G4733 Obstructive sleep apnea (adult) (pediatric): Secondary | ICD-10-CM | POA: Diagnosis not present

## 2015-04-27 DIAGNOSIS — I10 Essential (primary) hypertension: Secondary | ICD-10-CM | POA: Diagnosis not present

## 2015-04-27 DIAGNOSIS — E782 Mixed hyperlipidemia: Secondary | ICD-10-CM | POA: Diagnosis not present

## 2015-04-28 DIAGNOSIS — K219 Gastro-esophageal reflux disease without esophagitis: Secondary | ICD-10-CM | POA: Diagnosis not present

## 2015-04-28 DIAGNOSIS — J209 Acute bronchitis, unspecified: Secondary | ICD-10-CM | POA: Diagnosis not present

## 2015-04-28 DIAGNOSIS — D510 Vitamin B12 deficiency anemia due to intrinsic factor deficiency: Secondary | ICD-10-CM | POA: Diagnosis not present

## 2015-04-28 DIAGNOSIS — J0120 Acute ethmoidal sinusitis, unspecified: Secondary | ICD-10-CM | POA: Diagnosis not present

## 2015-05-12 DIAGNOSIS — R195 Other fecal abnormalities: Secondary | ICD-10-CM | POA: Diagnosis not present

## 2015-05-27 DIAGNOSIS — D510 Vitamin B12 deficiency anemia due to intrinsic factor deficiency: Secondary | ICD-10-CM | POA: Diagnosis not present

## 2015-05-28 DIAGNOSIS — D509 Iron deficiency anemia, unspecified: Secondary | ICD-10-CM | POA: Diagnosis not present

## 2015-05-28 DIAGNOSIS — K2901 Acute gastritis with bleeding: Secondary | ICD-10-CM | POA: Diagnosis not present

## 2015-05-28 DIAGNOSIS — K589 Irritable bowel syndrome without diarrhea: Secondary | ICD-10-CM | POA: Diagnosis not present

## 2015-05-28 DIAGNOSIS — K219 Gastro-esophageal reflux disease without esophagitis: Secondary | ICD-10-CM | POA: Diagnosis not present

## 2015-06-10 DIAGNOSIS — Z791 Long term (current) use of non-steroidal anti-inflammatories (NSAID): Secondary | ICD-10-CM | POA: Diagnosis not present

## 2015-06-10 DIAGNOSIS — K449 Diaphragmatic hernia without obstruction or gangrene: Secondary | ICD-10-CM | POA: Diagnosis not present

## 2015-06-10 DIAGNOSIS — R195 Other fecal abnormalities: Secondary | ICD-10-CM | POA: Diagnosis not present

## 2015-06-17 DIAGNOSIS — D120 Benign neoplasm of cecum: Secondary | ICD-10-CM | POA: Diagnosis not present

## 2015-06-17 DIAGNOSIS — D128 Benign neoplasm of rectum: Secondary | ICD-10-CM | POA: Diagnosis not present

## 2015-06-17 DIAGNOSIS — K5730 Diverticulosis of large intestine without perforation or abscess without bleeding: Secondary | ICD-10-CM | POA: Diagnosis not present

## 2015-06-17 DIAGNOSIS — K621 Rectal polyp: Secondary | ICD-10-CM | POA: Diagnosis not present

## 2015-06-17 DIAGNOSIS — R195 Other fecal abnormalities: Secondary | ICD-10-CM | POA: Diagnosis not present

## 2015-06-19 DIAGNOSIS — R195 Other fecal abnormalities: Secondary | ICD-10-CM | POA: Diagnosis not present

## 2015-06-19 DIAGNOSIS — E780 Pure hypercholesterolemia: Secondary | ICD-10-CM | POA: Diagnosis not present

## 2015-06-19 DIAGNOSIS — I10 Essential (primary) hypertension: Secondary | ICD-10-CM | POA: Diagnosis not present

## 2015-06-19 DIAGNOSIS — R079 Chest pain, unspecified: Secondary | ICD-10-CM | POA: Diagnosis not present

## 2015-06-19 DIAGNOSIS — K449 Diaphragmatic hernia without obstruction or gangrene: Secondary | ICD-10-CM | POA: Diagnosis not present

## 2015-06-19 DIAGNOSIS — E119 Type 2 diabetes mellitus without complications: Secondary | ICD-10-CM | POA: Diagnosis not present

## 2015-06-19 DIAGNOSIS — K922 Gastrointestinal hemorrhage, unspecified: Secondary | ICD-10-CM | POA: Diagnosis not present

## 2015-06-22 DIAGNOSIS — D649 Anemia, unspecified: Secondary | ICD-10-CM | POA: Diagnosis not present

## 2015-06-22 DIAGNOSIS — E780 Pure hypercholesterolemia: Secondary | ICD-10-CM | POA: Diagnosis not present

## 2015-06-22 DIAGNOSIS — I10 Essential (primary) hypertension: Secondary | ICD-10-CM | POA: Diagnosis not present

## 2015-06-30 DIAGNOSIS — N318 Other neuromuscular dysfunction of bladder: Secondary | ICD-10-CM | POA: Diagnosis not present

## 2015-06-30 DIAGNOSIS — N3090 Cystitis, unspecified without hematuria: Secondary | ICD-10-CM | POA: Diagnosis not present

## 2015-06-30 DIAGNOSIS — R351 Nocturia: Secondary | ICD-10-CM | POA: Diagnosis not present

## 2015-06-30 DIAGNOSIS — D510 Vitamin B12 deficiency anemia due to intrinsic factor deficiency: Secondary | ICD-10-CM | POA: Diagnosis not present

## 2015-06-30 DIAGNOSIS — N401 Enlarged prostate with lower urinary tract symptoms: Secondary | ICD-10-CM | POA: Diagnosis not present

## 2015-07-27 DIAGNOSIS — D510 Vitamin B12 deficiency anemia due to intrinsic factor deficiency: Secondary | ICD-10-CM | POA: Diagnosis not present

## 2015-07-29 DIAGNOSIS — D649 Anemia, unspecified: Secondary | ICD-10-CM | POA: Diagnosis not present

## 2015-07-29 DIAGNOSIS — E782 Mixed hyperlipidemia: Secondary | ICD-10-CM | POA: Diagnosis not present

## 2015-07-29 DIAGNOSIS — E669 Obesity, unspecified: Secondary | ICD-10-CM | POA: Diagnosis not present

## 2015-07-29 DIAGNOSIS — K219 Gastro-esophageal reflux disease without esophagitis: Secondary | ICD-10-CM | POA: Diagnosis not present

## 2015-08-24 DIAGNOSIS — R509 Fever, unspecified: Secondary | ICD-10-CM | POA: Diagnosis not present

## 2015-08-24 DIAGNOSIS — L519 Erythema multiforme, unspecified: Secondary | ICD-10-CM | POA: Diagnosis not present

## 2015-08-27 DIAGNOSIS — D510 Vitamin B12 deficiency anemia due to intrinsic factor deficiency: Secondary | ICD-10-CM | POA: Diagnosis not present

## 2015-09-04 DIAGNOSIS — D509 Iron deficiency anemia, unspecified: Secondary | ICD-10-CM | POA: Diagnosis not present

## 2015-09-07 DIAGNOSIS — R195 Other fecal abnormalities: Secondary | ICD-10-CM | POA: Diagnosis not present

## 2015-09-17 DIAGNOSIS — K2901 Acute gastritis with bleeding: Secondary | ICD-10-CM | POA: Diagnosis not present

## 2015-09-17 DIAGNOSIS — K219 Gastro-esophageal reflux disease without esophagitis: Secondary | ICD-10-CM | POA: Diagnosis not present

## 2015-09-17 DIAGNOSIS — R195 Other fecal abnormalities: Secondary | ICD-10-CM | POA: Diagnosis not present

## 2015-09-17 DIAGNOSIS — Z23 Encounter for immunization: Secondary | ICD-10-CM | POA: Diagnosis not present

## 2015-09-17 DIAGNOSIS — D509 Iron deficiency anemia, unspecified: Secondary | ICD-10-CM | POA: Diagnosis not present

## 2015-09-24 DIAGNOSIS — D510 Vitamin B12 deficiency anemia due to intrinsic factor deficiency: Secondary | ICD-10-CM | POA: Diagnosis not present

## 2015-10-27 DIAGNOSIS — D510 Vitamin B12 deficiency anemia due to intrinsic factor deficiency: Secondary | ICD-10-CM | POA: Diagnosis not present

## 2015-10-29 DIAGNOSIS — K219 Gastro-esophageal reflux disease without esophagitis: Secondary | ICD-10-CM | POA: Diagnosis not present

## 2015-10-29 DIAGNOSIS — D649 Anemia, unspecified: Secondary | ICD-10-CM | POA: Diagnosis not present

## 2015-10-29 DIAGNOSIS — G4733 Obstructive sleep apnea (adult) (pediatric): Secondary | ICD-10-CM | POA: Diagnosis not present

## 2015-11-26 DIAGNOSIS — D510 Vitamin B12 deficiency anemia due to intrinsic factor deficiency: Secondary | ICD-10-CM | POA: Diagnosis not present

## 2015-12-24 DIAGNOSIS — D510 Vitamin B12 deficiency anemia due to intrinsic factor deficiency: Secondary | ICD-10-CM | POA: Diagnosis not present

## 2015-12-24 DIAGNOSIS — B9789 Other viral agents as the cause of diseases classified elsewhere: Secondary | ICD-10-CM | POA: Diagnosis not present

## 2015-12-24 DIAGNOSIS — J069 Acute upper respiratory infection, unspecified: Secondary | ICD-10-CM | POA: Diagnosis not present

## 2015-12-29 DIAGNOSIS — N401 Enlarged prostate with lower urinary tract symptoms: Secondary | ICD-10-CM | POA: Diagnosis not present

## 2015-12-29 DIAGNOSIS — N3020 Other chronic cystitis without hematuria: Secondary | ICD-10-CM | POA: Diagnosis not present

## 2015-12-29 DIAGNOSIS — N318 Other neuromuscular dysfunction of bladder: Secondary | ICD-10-CM | POA: Diagnosis not present

## 2015-12-29 DIAGNOSIS — R351 Nocturia: Secondary | ICD-10-CM | POA: Diagnosis not present

## 2016-01-26 DIAGNOSIS — D510 Vitamin B12 deficiency anemia due to intrinsic factor deficiency: Secondary | ICD-10-CM | POA: Diagnosis not present

## 2016-02-24 DIAGNOSIS — D510 Vitamin B12 deficiency anemia due to intrinsic factor deficiency: Secondary | ICD-10-CM | POA: Diagnosis not present

## 2016-03-01 DIAGNOSIS — D509 Iron deficiency anemia, unspecified: Secondary | ICD-10-CM | POA: Diagnosis not present

## 2016-03-03 DIAGNOSIS — D509 Iron deficiency anemia, unspecified: Secondary | ICD-10-CM | POA: Diagnosis not present

## 2016-03-10 DIAGNOSIS — D509 Iron deficiency anemia, unspecified: Secondary | ICD-10-CM | POA: Diagnosis not present

## 2016-03-10 DIAGNOSIS — K219 Gastro-esophageal reflux disease without esophagitis: Secondary | ICD-10-CM | POA: Diagnosis not present

## 2016-03-10 DIAGNOSIS — R195 Other fecal abnormalities: Secondary | ICD-10-CM | POA: Diagnosis not present

## 2016-03-25 DIAGNOSIS — D510 Vitamin B12 deficiency anemia due to intrinsic factor deficiency: Secondary | ICD-10-CM | POA: Diagnosis not present

## 2016-04-25 DIAGNOSIS — D510 Vitamin B12 deficiency anemia due to intrinsic factor deficiency: Secondary | ICD-10-CM | POA: Diagnosis not present

## 2016-04-29 DIAGNOSIS — Z0000 Encounter for general adult medical examination without abnormal findings: Secondary | ICD-10-CM | POA: Diagnosis not present

## 2016-04-29 DIAGNOSIS — Z125 Encounter for screening for malignant neoplasm of prostate: Secondary | ICD-10-CM | POA: Diagnosis not present

## 2016-04-29 DIAGNOSIS — I10 Essential (primary) hypertension: Secondary | ICD-10-CM | POA: Diagnosis not present

## 2016-04-29 DIAGNOSIS — Z79899 Other long term (current) drug therapy: Secondary | ICD-10-CM | POA: Diagnosis not present

## 2016-04-29 DIAGNOSIS — E782 Mixed hyperlipidemia: Secondary | ICD-10-CM | POA: Diagnosis not present

## 2016-04-29 DIAGNOSIS — K219 Gastro-esophageal reflux disease without esophagitis: Secondary | ICD-10-CM | POA: Diagnosis not present

## 2016-05-26 DIAGNOSIS — E538 Deficiency of other specified B group vitamins: Secondary | ICD-10-CM | POA: Diagnosis not present

## 2016-06-23 DIAGNOSIS — E538 Deficiency of other specified B group vitamins: Secondary | ICD-10-CM | POA: Diagnosis not present

## 2016-06-27 DIAGNOSIS — R338 Other retention of urine: Secondary | ICD-10-CM | POA: Diagnosis not present

## 2016-06-27 DIAGNOSIS — N318 Other neuromuscular dysfunction of bladder: Secondary | ICD-10-CM | POA: Diagnosis not present

## 2016-06-27 DIAGNOSIS — N3020 Other chronic cystitis without hematuria: Secondary | ICD-10-CM | POA: Diagnosis not present

## 2016-06-27 DIAGNOSIS — N401 Enlarged prostate with lower urinary tract symptoms: Secondary | ICD-10-CM | POA: Diagnosis not present

## 2016-06-27 DIAGNOSIS — N312 Flaccid neuropathic bladder, not elsewhere classified: Secondary | ICD-10-CM | POA: Diagnosis not present

## 2016-07-11 DIAGNOSIS — N318 Other neuromuscular dysfunction of bladder: Secondary | ICD-10-CM | POA: Diagnosis not present

## 2016-07-11 DIAGNOSIS — N401 Enlarged prostate with lower urinary tract symptoms: Secondary | ICD-10-CM | POA: Diagnosis not present

## 2016-07-11 DIAGNOSIS — R339 Retention of urine, unspecified: Secondary | ICD-10-CM | POA: Diagnosis not present

## 2016-07-11 DIAGNOSIS — N3020 Other chronic cystitis without hematuria: Secondary | ICD-10-CM | POA: Diagnosis not present

## 2016-07-11 DIAGNOSIS — R351 Nocturia: Secondary | ICD-10-CM | POA: Diagnosis not present

## 2016-07-11 DIAGNOSIS — N312 Flaccid neuropathic bladder, not elsewhere classified: Secondary | ICD-10-CM | POA: Diagnosis not present

## 2016-07-26 DIAGNOSIS — E538 Deficiency of other specified B group vitamins: Secondary | ICD-10-CM | POA: Diagnosis not present

## 2016-08-31 DIAGNOSIS — D509 Iron deficiency anemia, unspecified: Secondary | ICD-10-CM | POA: Diagnosis not present

## 2016-09-02 DIAGNOSIS — D509 Iron deficiency anemia, unspecified: Secondary | ICD-10-CM | POA: Diagnosis not present

## 2016-09-06 DIAGNOSIS — K219 Gastro-esophageal reflux disease without esophagitis: Secondary | ICD-10-CM | POA: Diagnosis not present

## 2016-09-06 DIAGNOSIS — D509 Iron deficiency anemia, unspecified: Secondary | ICD-10-CM | POA: Diagnosis not present

## 2016-09-06 DIAGNOSIS — R195 Other fecal abnormalities: Secondary | ICD-10-CM | POA: Diagnosis not present

## 2016-09-06 DIAGNOSIS — K2961 Other gastritis with bleeding: Secondary | ICD-10-CM | POA: Diagnosis not present

## 2016-09-15 DIAGNOSIS — E538 Deficiency of other specified B group vitamins: Secondary | ICD-10-CM | POA: Diagnosis not present

## 2016-09-15 DIAGNOSIS — Z23 Encounter for immunization: Secondary | ICD-10-CM | POA: Diagnosis not present

## 2016-10-05 DIAGNOSIS — E538 Deficiency of other specified B group vitamins: Secondary | ICD-10-CM | POA: Diagnosis not present

## 2016-10-10 DIAGNOSIS — N312 Flaccid neuropathic bladder, not elsewhere classified: Secondary | ICD-10-CM | POA: Diagnosis not present

## 2016-10-10 DIAGNOSIS — N401 Enlarged prostate with lower urinary tract symptoms: Secondary | ICD-10-CM | POA: Diagnosis not present

## 2016-10-26 DIAGNOSIS — E538 Deficiency of other specified B group vitamins: Secondary | ICD-10-CM | POA: Diagnosis not present

## 2016-11-24 DIAGNOSIS — J0100 Acute maxillary sinusitis, unspecified: Secondary | ICD-10-CM | POA: Diagnosis not present

## 2016-12-27 DIAGNOSIS — E538 Deficiency of other specified B group vitamins: Secondary | ICD-10-CM | POA: Diagnosis not present

## 2017-01-24 DIAGNOSIS — H04121 Dry eye syndrome of right lacrimal gland: Secondary | ICD-10-CM | POA: Diagnosis not present

## 2017-01-24 DIAGNOSIS — H26492 Other secondary cataract, left eye: Secondary | ICD-10-CM | POA: Diagnosis not present

## 2017-01-24 DIAGNOSIS — H02401 Unspecified ptosis of right eyelid: Secondary | ICD-10-CM | POA: Diagnosis not present

## 2017-01-26 DIAGNOSIS — E538 Deficiency of other specified B group vitamins: Secondary | ICD-10-CM | POA: Diagnosis not present

## 2017-02-23 DIAGNOSIS — E538 Deficiency of other specified B group vitamins: Secondary | ICD-10-CM | POA: Diagnosis not present

## 2017-02-23 DIAGNOSIS — D509 Iron deficiency anemia, unspecified: Secondary | ICD-10-CM | POA: Diagnosis not present

## 2017-02-28 DIAGNOSIS — D509 Iron deficiency anemia, unspecified: Secondary | ICD-10-CM | POA: Diagnosis not present

## 2017-03-07 DIAGNOSIS — K219 Gastro-esophageal reflux disease without esophagitis: Secondary | ICD-10-CM | POA: Diagnosis not present

## 2017-03-07 DIAGNOSIS — D509 Iron deficiency anemia, unspecified: Secondary | ICD-10-CM | POA: Diagnosis not present

## 2017-03-07 DIAGNOSIS — K2971 Gastritis, unspecified, with bleeding: Secondary | ICD-10-CM | POA: Diagnosis not present

## 2017-03-27 DIAGNOSIS — E538 Deficiency of other specified B group vitamins: Secondary | ICD-10-CM | POA: Diagnosis not present

## 2017-04-02 DIAGNOSIS — J029 Acute pharyngitis, unspecified: Secondary | ICD-10-CM | POA: Diagnosis not present

## 2017-04-10 DIAGNOSIS — N312 Flaccid neuropathic bladder, not elsewhere classified: Secondary | ICD-10-CM | POA: Diagnosis not present

## 2017-04-10 DIAGNOSIS — N401 Enlarged prostate with lower urinary tract symptoms: Secondary | ICD-10-CM | POA: Diagnosis not present

## 2017-04-10 DIAGNOSIS — R351 Nocturia: Secondary | ICD-10-CM | POA: Diagnosis not present

## 2017-04-25 DIAGNOSIS — D510 Vitamin B12 deficiency anemia due to intrinsic factor deficiency: Secondary | ICD-10-CM | POA: Diagnosis not present

## 2017-05-01 DIAGNOSIS — Z0000 Encounter for general adult medical examination without abnormal findings: Secondary | ICD-10-CM | POA: Diagnosis not present

## 2017-05-01 DIAGNOSIS — Z79899 Other long term (current) drug therapy: Secondary | ICD-10-CM | POA: Diagnosis not present

## 2017-05-01 DIAGNOSIS — E782 Mixed hyperlipidemia: Secondary | ICD-10-CM | POA: Diagnosis not present

## 2017-05-01 DIAGNOSIS — R05 Cough: Secondary | ICD-10-CM | POA: Diagnosis not present

## 2017-05-01 DIAGNOSIS — G4733 Obstructive sleep apnea (adult) (pediatric): Secondary | ICD-10-CM | POA: Diagnosis not present

## 2017-05-01 DIAGNOSIS — I119 Hypertensive heart disease without heart failure: Secondary | ICD-10-CM | POA: Diagnosis not present

## 2017-05-01 DIAGNOSIS — J0100 Acute maxillary sinusitis, unspecified: Secondary | ICD-10-CM | POA: Diagnosis not present

## 2017-05-25 DIAGNOSIS — D510 Vitamin B12 deficiency anemia due to intrinsic factor deficiency: Secondary | ICD-10-CM | POA: Diagnosis not present

## 2017-06-02 DIAGNOSIS — J8410 Pulmonary fibrosis, unspecified: Secondary | ICD-10-CM | POA: Diagnosis not present

## 2017-06-02 DIAGNOSIS — Z6830 Body mass index (BMI) 30.0-30.9, adult: Secondary | ICD-10-CM | POA: Diagnosis not present

## 2017-06-19 DIAGNOSIS — G4733 Obstructive sleep apnea (adult) (pediatric): Secondary | ICD-10-CM | POA: Diagnosis not present

## 2017-06-26 DIAGNOSIS — D510 Vitamin B12 deficiency anemia due to intrinsic factor deficiency: Secondary | ICD-10-CM | POA: Diagnosis not present

## 2017-07-11 DIAGNOSIS — N3020 Other chronic cystitis without hematuria: Secondary | ICD-10-CM | POA: Diagnosis not present

## 2017-07-11 DIAGNOSIS — N401 Enlarged prostate with lower urinary tract symptoms: Secondary | ICD-10-CM | POA: Diagnosis not present

## 2017-07-11 DIAGNOSIS — N312 Flaccid neuropathic bladder, not elsewhere classified: Secondary | ICD-10-CM | POA: Diagnosis not present

## 2017-07-12 DIAGNOSIS — G4733 Obstructive sleep apnea (adult) (pediatric): Secondary | ICD-10-CM | POA: Diagnosis not present

## 2017-07-13 DIAGNOSIS — G4733 Obstructive sleep apnea (adult) (pediatric): Secondary | ICD-10-CM | POA: Diagnosis not present

## 2017-08-07 DIAGNOSIS — D510 Vitamin B12 deficiency anemia due to intrinsic factor deficiency: Secondary | ICD-10-CM | POA: Diagnosis not present

## 2017-08-14 DIAGNOSIS — N312 Flaccid neuropathic bladder, not elsewhere classified: Secondary | ICD-10-CM | POA: Diagnosis not present

## 2017-08-14 DIAGNOSIS — N401 Enlarged prostate with lower urinary tract symptoms: Secondary | ICD-10-CM | POA: Diagnosis not present

## 2017-08-30 DIAGNOSIS — D510 Vitamin B12 deficiency anemia due to intrinsic factor deficiency: Secondary | ICD-10-CM | POA: Diagnosis not present

## 2017-08-31 DIAGNOSIS — Z01818 Encounter for other preprocedural examination: Secondary | ICD-10-CM | POA: Diagnosis not present

## 2017-08-31 DIAGNOSIS — I10 Essential (primary) hypertension: Secondary | ICD-10-CM | POA: Diagnosis not present

## 2017-08-31 DIAGNOSIS — E785 Hyperlipidemia, unspecified: Secondary | ICD-10-CM | POA: Diagnosis not present

## 2017-08-31 DIAGNOSIS — Z79899 Other long term (current) drug therapy: Secondary | ICD-10-CM | POA: Diagnosis not present

## 2017-08-31 DIAGNOSIS — N312 Flaccid neuropathic bladder, not elsewhere classified: Secondary | ICD-10-CM | POA: Diagnosis not present

## 2017-08-31 DIAGNOSIS — N138 Other obstructive and reflux uropathy: Secondary | ICD-10-CM | POA: Diagnosis not present

## 2017-08-31 DIAGNOSIS — R351 Nocturia: Secondary | ICD-10-CM | POA: Diagnosis not present

## 2017-08-31 DIAGNOSIS — Z87891 Personal history of nicotine dependence: Secondary | ICD-10-CM | POA: Diagnosis not present

## 2017-08-31 DIAGNOSIS — K219 Gastro-esophageal reflux disease without esophagitis: Secondary | ICD-10-CM | POA: Diagnosis not present

## 2017-08-31 DIAGNOSIS — N401 Enlarged prostate with lower urinary tract symptoms: Secondary | ICD-10-CM | POA: Diagnosis not present

## 2017-08-31 DIAGNOSIS — G4730 Sleep apnea, unspecified: Secondary | ICD-10-CM | POA: Diagnosis not present

## 2017-09-07 DIAGNOSIS — N401 Enlarged prostate with lower urinary tract symptoms: Secondary | ICD-10-CM | POA: Diagnosis not present

## 2017-09-07 DIAGNOSIS — N3020 Other chronic cystitis without hematuria: Secondary | ICD-10-CM | POA: Diagnosis not present

## 2017-09-07 DIAGNOSIS — N312 Flaccid neuropathic bladder, not elsewhere classified: Secondary | ICD-10-CM | POA: Diagnosis not present

## 2017-09-11 DIAGNOSIS — D509 Iron deficiency anemia, unspecified: Secondary | ICD-10-CM | POA: Diagnosis not present

## 2017-09-14 DIAGNOSIS — K219 Gastro-esophageal reflux disease without esophagitis: Secondary | ICD-10-CM | POA: Diagnosis not present

## 2017-09-14 DIAGNOSIS — R195 Other fecal abnormalities: Secondary | ICD-10-CM | POA: Diagnosis not present

## 2017-09-14 DIAGNOSIS — D509 Iron deficiency anemia, unspecified: Secondary | ICD-10-CM | POA: Diagnosis not present

## 2017-09-14 DIAGNOSIS — K2971 Gastritis, unspecified, with bleeding: Secondary | ICD-10-CM | POA: Diagnosis not present

## 2017-09-21 DIAGNOSIS — N3020 Other chronic cystitis without hematuria: Secondary | ICD-10-CM | POA: Diagnosis not present

## 2017-09-21 DIAGNOSIS — N318 Other neuromuscular dysfunction of bladder: Secondary | ICD-10-CM | POA: Diagnosis not present

## 2017-09-21 DIAGNOSIS — R351 Nocturia: Secondary | ICD-10-CM | POA: Diagnosis not present

## 2017-09-21 DIAGNOSIS — N401 Enlarged prostate with lower urinary tract symptoms: Secondary | ICD-10-CM | POA: Diagnosis not present

## 2017-09-25 DIAGNOSIS — D510 Vitamin B12 deficiency anemia due to intrinsic factor deficiency: Secondary | ICD-10-CM | POA: Diagnosis not present

## 2017-10-03 DIAGNOSIS — Z23 Encounter for immunization: Secondary | ICD-10-CM | POA: Diagnosis not present

## 2017-10-23 DIAGNOSIS — N312 Flaccid neuropathic bladder, not elsewhere classified: Secondary | ICD-10-CM | POA: Diagnosis not present

## 2017-10-23 DIAGNOSIS — N401 Enlarged prostate with lower urinary tract symptoms: Secondary | ICD-10-CM | POA: Diagnosis not present

## 2017-10-23 DIAGNOSIS — N3020 Other chronic cystitis without hematuria: Secondary | ICD-10-CM | POA: Diagnosis not present

## 2017-10-23 DIAGNOSIS — N318 Other neuromuscular dysfunction of bladder: Secondary | ICD-10-CM | POA: Diagnosis not present

## 2017-10-25 DIAGNOSIS — D510 Vitamin B12 deficiency anemia due to intrinsic factor deficiency: Secondary | ICD-10-CM | POA: Diagnosis not present

## 2017-11-22 DIAGNOSIS — G4733 Obstructive sleep apnea (adult) (pediatric): Secondary | ICD-10-CM | POA: Diagnosis not present

## 2017-11-24 DIAGNOSIS — D510 Vitamin B12 deficiency anemia due to intrinsic factor deficiency: Secondary | ICD-10-CM | POA: Diagnosis not present

## 2017-12-21 DIAGNOSIS — D510 Vitamin B12 deficiency anemia due to intrinsic factor deficiency: Secondary | ICD-10-CM | POA: Diagnosis not present

## 2018-01-22 DIAGNOSIS — N312 Flaccid neuropathic bladder, not elsewhere classified: Secondary | ICD-10-CM | POA: Diagnosis not present

## 2018-01-22 DIAGNOSIS — N401 Enlarged prostate with lower urinary tract symptoms: Secondary | ICD-10-CM | POA: Diagnosis not present

## 2018-01-22 DIAGNOSIS — N318 Other neuromuscular dysfunction of bladder: Secondary | ICD-10-CM | POA: Diagnosis not present

## 2018-01-22 DIAGNOSIS — N3020 Other chronic cystitis without hematuria: Secondary | ICD-10-CM | POA: Diagnosis not present

## 2018-01-25 DIAGNOSIS — D510 Vitamin B12 deficiency anemia due to intrinsic factor deficiency: Secondary | ICD-10-CM | POA: Diagnosis not present

## 2018-02-14 DIAGNOSIS — H04121 Dry eye syndrome of right lacrimal gland: Secondary | ICD-10-CM | POA: Diagnosis not present

## 2018-02-14 DIAGNOSIS — H26492 Other secondary cataract, left eye: Secondary | ICD-10-CM | POA: Diagnosis not present

## 2018-02-14 DIAGNOSIS — H02401 Unspecified ptosis of right eyelid: Secondary | ICD-10-CM | POA: Diagnosis not present

## 2018-02-23 DIAGNOSIS — D510 Vitamin B12 deficiency anemia due to intrinsic factor deficiency: Secondary | ICD-10-CM | POA: Diagnosis not present

## 2018-03-23 DIAGNOSIS — D509 Iron deficiency anemia, unspecified: Secondary | ICD-10-CM | POA: Diagnosis not present

## 2018-03-26 DIAGNOSIS — D510 Vitamin B12 deficiency anemia due to intrinsic factor deficiency: Secondary | ICD-10-CM | POA: Diagnosis not present

## 2018-03-27 DIAGNOSIS — K219 Gastro-esophageal reflux disease without esophagitis: Secondary | ICD-10-CM | POA: Diagnosis not present

## 2018-03-27 DIAGNOSIS — D500 Iron deficiency anemia secondary to blood loss (chronic): Secondary | ICD-10-CM | POA: Diagnosis not present

## 2018-03-27 DIAGNOSIS — K2971 Gastritis, unspecified, with bleeding: Secondary | ICD-10-CM | POA: Diagnosis not present

## 2018-04-25 DIAGNOSIS — D510 Vitamin B12 deficiency anemia due to intrinsic factor deficiency: Secondary | ICD-10-CM | POA: Diagnosis not present

## 2018-05-03 DIAGNOSIS — I119 Hypertensive heart disease without heart failure: Secondary | ICD-10-CM | POA: Diagnosis not present

## 2018-05-03 DIAGNOSIS — Z125 Encounter for screening for malignant neoplasm of prostate: Secondary | ICD-10-CM | POA: Diagnosis not present

## 2018-05-03 DIAGNOSIS — G4733 Obstructive sleep apnea (adult) (pediatric): Secondary | ICD-10-CM | POA: Diagnosis not present

## 2018-05-03 DIAGNOSIS — K219 Gastro-esophageal reflux disease without esophagitis: Secondary | ICD-10-CM | POA: Diagnosis not present

## 2018-05-03 DIAGNOSIS — E782 Mixed hyperlipidemia: Secondary | ICD-10-CM | POA: Diagnosis not present

## 2018-05-03 DIAGNOSIS — Z79899 Other long term (current) drug therapy: Secondary | ICD-10-CM | POA: Diagnosis not present

## 2018-05-03 DIAGNOSIS — Z0000 Encounter for general adult medical examination without abnormal findings: Secondary | ICD-10-CM | POA: Diagnosis not present

## 2018-05-28 DIAGNOSIS — D510 Vitamin B12 deficiency anemia due to intrinsic factor deficiency: Secondary | ICD-10-CM | POA: Diagnosis not present

## 2018-05-28 DIAGNOSIS — H6123 Impacted cerumen, bilateral: Secondary | ICD-10-CM | POA: Diagnosis not present

## 2018-05-28 DIAGNOSIS — R05 Cough: Secondary | ICD-10-CM | POA: Diagnosis not present

## 2018-05-28 DIAGNOSIS — J189 Pneumonia, unspecified organism: Secondary | ICD-10-CM | POA: Diagnosis not present

## 2018-06-22 DIAGNOSIS — D510 Vitamin B12 deficiency anemia due to intrinsic factor deficiency: Secondary | ICD-10-CM | POA: Diagnosis not present

## 2018-07-23 DIAGNOSIS — D510 Vitamin B12 deficiency anemia due to intrinsic factor deficiency: Secondary | ICD-10-CM | POA: Diagnosis not present

## 2018-07-23 DIAGNOSIS — N318 Other neuromuscular dysfunction of bladder: Secondary | ICD-10-CM | POA: Diagnosis not present

## 2018-07-23 DIAGNOSIS — N401 Enlarged prostate with lower urinary tract symptoms: Secondary | ICD-10-CM | POA: Diagnosis not present

## 2018-07-23 DIAGNOSIS — N3020 Other chronic cystitis without hematuria: Secondary | ICD-10-CM | POA: Diagnosis not present

## 2018-08-23 DIAGNOSIS — D510 Vitamin B12 deficiency anemia due to intrinsic factor deficiency: Secondary | ICD-10-CM | POA: Diagnosis not present

## 2018-09-03 DIAGNOSIS — H18421 Band keratopathy, right eye: Secondary | ICD-10-CM | POA: Diagnosis not present

## 2018-09-03 DIAGNOSIS — H02401 Unspecified ptosis of right eyelid: Secondary | ICD-10-CM | POA: Diagnosis not present

## 2018-09-06 DIAGNOSIS — Z23 Encounter for immunization: Secondary | ICD-10-CM | POA: Diagnosis not present

## 2018-09-17 DIAGNOSIS — H209 Unspecified iridocyclitis: Secondary | ICD-10-CM | POA: Diagnosis not present

## 2018-09-25 DIAGNOSIS — D510 Vitamin B12 deficiency anemia due to intrinsic factor deficiency: Secondary | ICD-10-CM | POA: Diagnosis not present

## 2018-10-08 DIAGNOSIS — H18421 Band keratopathy, right eye: Secondary | ICD-10-CM | POA: Diagnosis not present

## 2018-10-08 DIAGNOSIS — H209 Unspecified iridocyclitis: Secondary | ICD-10-CM | POA: Diagnosis not present

## 2018-10-16 DIAGNOSIS — D500 Iron deficiency anemia secondary to blood loss (chronic): Secondary | ICD-10-CM | POA: Diagnosis not present

## 2018-10-18 DIAGNOSIS — D500 Iron deficiency anemia secondary to blood loss (chronic): Secondary | ICD-10-CM | POA: Diagnosis not present

## 2018-10-18 DIAGNOSIS — K219 Gastro-esophageal reflux disease without esophagitis: Secondary | ICD-10-CM | POA: Diagnosis not present

## 2018-10-18 DIAGNOSIS — R195 Other fecal abnormalities: Secondary | ICD-10-CM | POA: Diagnosis not present

## 2018-10-25 DIAGNOSIS — D510 Vitamin B12 deficiency anemia due to intrinsic factor deficiency: Secondary | ICD-10-CM | POA: Diagnosis not present

## 2018-11-26 DIAGNOSIS — G4733 Obstructive sleep apnea (adult) (pediatric): Secondary | ICD-10-CM | POA: Diagnosis not present

## 2018-11-27 DIAGNOSIS — Z23 Encounter for immunization: Secondary | ICD-10-CM | POA: Diagnosis not present

## 2018-12-25 DIAGNOSIS — D510 Vitamin B12 deficiency anemia due to intrinsic factor deficiency: Secondary | ICD-10-CM | POA: Diagnosis not present

## 2019-01-14 DIAGNOSIS — J0100 Acute maxillary sinusitis, unspecified: Secondary | ICD-10-CM | POA: Diagnosis not present

## 2019-01-14 DIAGNOSIS — J029 Acute pharyngitis, unspecified: Secondary | ICD-10-CM | POA: Diagnosis not present

## 2019-01-25 DIAGNOSIS — D510 Vitamin B12 deficiency anemia due to intrinsic factor deficiency: Secondary | ICD-10-CM | POA: Diagnosis not present

## 2019-01-25 DIAGNOSIS — N401 Enlarged prostate with lower urinary tract symptoms: Secondary | ICD-10-CM | POA: Diagnosis not present

## 2019-02-22 DIAGNOSIS — D519 Vitamin B12 deficiency anemia, unspecified: Secondary | ICD-10-CM | POA: Diagnosis not present

## 2019-02-26 DIAGNOSIS — N401 Enlarged prostate with lower urinary tract symptoms: Secondary | ICD-10-CM | POA: Diagnosis not present

## 2019-02-26 DIAGNOSIS — N318 Other neuromuscular dysfunction of bladder: Secondary | ICD-10-CM | POA: Diagnosis not present

## 2019-02-26 DIAGNOSIS — N3020 Other chronic cystitis without hematuria: Secondary | ICD-10-CM | POA: Diagnosis not present

## 2019-03-08 DIAGNOSIS — J069 Acute upper respiratory infection, unspecified: Secondary | ICD-10-CM | POA: Diagnosis not present

## 2019-03-12 DIAGNOSIS — N3020 Other chronic cystitis without hematuria: Secondary | ICD-10-CM | POA: Diagnosis not present

## 2019-03-12 DIAGNOSIS — N318 Other neuromuscular dysfunction of bladder: Secondary | ICD-10-CM | POA: Diagnosis not present

## 2019-04-05 DIAGNOSIS — D500 Iron deficiency anemia secondary to blood loss (chronic): Secondary | ICD-10-CM | POA: Diagnosis not present

## 2019-04-09 DIAGNOSIS — R195 Other fecal abnormalities: Secondary | ICD-10-CM | POA: Diagnosis not present

## 2019-04-11 DIAGNOSIS — D500 Iron deficiency anemia secondary to blood loss (chronic): Secondary | ICD-10-CM | POA: Diagnosis not present

## 2019-04-11 DIAGNOSIS — K2971 Gastritis, unspecified, with bleeding: Secondary | ICD-10-CM | POA: Diagnosis not present

## 2019-04-11 DIAGNOSIS — K219 Gastro-esophageal reflux disease without esophagitis: Secondary | ICD-10-CM | POA: Diagnosis not present

## 2019-04-11 DIAGNOSIS — D510 Vitamin B12 deficiency anemia due to intrinsic factor deficiency: Secondary | ICD-10-CM | POA: Diagnosis not present

## 2019-04-15 DIAGNOSIS — N401 Enlarged prostate with lower urinary tract symptoms: Secondary | ICD-10-CM | POA: Diagnosis not present

## 2019-04-15 DIAGNOSIS — N318 Other neuromuscular dysfunction of bladder: Secondary | ICD-10-CM | POA: Diagnosis not present

## 2019-04-15 DIAGNOSIS — N3020 Other chronic cystitis without hematuria: Secondary | ICD-10-CM | POA: Diagnosis not present

## 2019-05-07 DIAGNOSIS — E538 Deficiency of other specified B group vitamins: Secondary | ICD-10-CM | POA: Diagnosis not present

## 2019-05-07 DIAGNOSIS — E782 Mixed hyperlipidemia: Secondary | ICD-10-CM | POA: Diagnosis not present

## 2019-05-07 DIAGNOSIS — Z79899 Other long term (current) drug therapy: Secondary | ICD-10-CM | POA: Diagnosis not present

## 2019-05-07 DIAGNOSIS — I10 Essential (primary) hypertension: Secondary | ICD-10-CM | POA: Diagnosis not present

## 2019-05-07 DIAGNOSIS — K219 Gastro-esophageal reflux disease without esophagitis: Secondary | ICD-10-CM | POA: Diagnosis not present

## 2019-05-07 DIAGNOSIS — R739 Hyperglycemia, unspecified: Secondary | ICD-10-CM | POA: Diagnosis not present

## 2019-05-07 DIAGNOSIS — Z0000 Encounter for general adult medical examination without abnormal findings: Secondary | ICD-10-CM | POA: Diagnosis not present

## 2019-05-07 DIAGNOSIS — Z125 Encounter for screening for malignant neoplasm of prostate: Secondary | ICD-10-CM | POA: Diagnosis not present

## 2019-05-08 DIAGNOSIS — D519 Vitamin B12 deficiency anemia, unspecified: Secondary | ICD-10-CM | POA: Diagnosis not present

## 2019-05-29 DIAGNOSIS — H02401 Unspecified ptosis of right eyelid: Secondary | ICD-10-CM | POA: Diagnosis not present

## 2019-05-29 DIAGNOSIS — H18791 Other corneal deformities, right eye: Secondary | ICD-10-CM | POA: Diagnosis not present

## 2019-06-03 DIAGNOSIS — E538 Deficiency of other specified B group vitamins: Secondary | ICD-10-CM | POA: Diagnosis not present

## 2019-06-27 DIAGNOSIS — E538 Deficiency of other specified B group vitamins: Secondary | ICD-10-CM | POA: Diagnosis not present

## 2019-07-08 DIAGNOSIS — N318 Other neuromuscular dysfunction of bladder: Secondary | ICD-10-CM | POA: Diagnosis not present

## 2019-07-08 DIAGNOSIS — N401 Enlarged prostate with lower urinary tract symptoms: Secondary | ICD-10-CM | POA: Diagnosis not present

## 2019-07-08 DIAGNOSIS — N3020 Other chronic cystitis without hematuria: Secondary | ICD-10-CM | POA: Diagnosis not present

## 2019-07-25 DIAGNOSIS — E538 Deficiency of other specified B group vitamins: Secondary | ICD-10-CM | POA: Diagnosis not present

## 2019-08-07 DIAGNOSIS — D500 Iron deficiency anemia secondary to blood loss (chronic): Secondary | ICD-10-CM | POA: Diagnosis not present

## 2019-08-27 DIAGNOSIS — D519 Vitamin B12 deficiency anemia, unspecified: Secondary | ICD-10-CM | POA: Diagnosis not present

## 2019-09-26 DIAGNOSIS — E538 Deficiency of other specified B group vitamins: Secondary | ICD-10-CM | POA: Diagnosis not present

## 2019-10-07 DIAGNOSIS — N3020 Other chronic cystitis without hematuria: Secondary | ICD-10-CM | POA: Diagnosis not present

## 2019-10-07 DIAGNOSIS — D500 Iron deficiency anemia secondary to blood loss (chronic): Secondary | ICD-10-CM | POA: Diagnosis not present

## 2019-10-07 DIAGNOSIS — N318 Other neuromuscular dysfunction of bladder: Secondary | ICD-10-CM | POA: Diagnosis not present

## 2019-10-17 DIAGNOSIS — L0100 Impetigo, unspecified: Secondary | ICD-10-CM | POA: Diagnosis not present

## 2019-10-24 DIAGNOSIS — E538 Deficiency of other specified B group vitamins: Secondary | ICD-10-CM | POA: Diagnosis not present

## 2019-10-24 DIAGNOSIS — Z23 Encounter for immunization: Secondary | ICD-10-CM | POA: Diagnosis not present

## 2019-11-07 DIAGNOSIS — N318 Other neuromuscular dysfunction of bladder: Secondary | ICD-10-CM | POA: Diagnosis not present

## 2019-11-07 DIAGNOSIS — N401 Enlarged prostate with lower urinary tract symptoms: Secondary | ICD-10-CM | POA: Diagnosis not present

## 2019-11-07 DIAGNOSIS — N3020 Other chronic cystitis without hematuria: Secondary | ICD-10-CM | POA: Diagnosis not present

## 2019-11-26 DIAGNOSIS — D519 Vitamin B12 deficiency anemia, unspecified: Secondary | ICD-10-CM | POA: Diagnosis not present

## 2019-11-27 DIAGNOSIS — G4733 Obstructive sleep apnea (adult) (pediatric): Secondary | ICD-10-CM | POA: Diagnosis not present

## 2020-06-26 DIAGNOSIS — D510 Vitamin B12 deficiency anemia due to intrinsic factor deficiency: Secondary | ICD-10-CM | POA: Diagnosis not present

## 2020-06-30 DIAGNOSIS — G4733 Obstructive sleep apnea (adult) (pediatric): Secondary | ICD-10-CM | POA: Diagnosis not present

## 2020-07-27 DIAGNOSIS — D510 Vitamin B12 deficiency anemia due to intrinsic factor deficiency: Secondary | ICD-10-CM | POA: Diagnosis not present

## 2020-08-12 DIAGNOSIS — R197 Diarrhea, unspecified: Secondary | ICD-10-CM | POA: Diagnosis not present

## 2020-08-26 DIAGNOSIS — D510 Vitamin B12 deficiency anemia due to intrinsic factor deficiency: Secondary | ICD-10-CM | POA: Diagnosis not present

## 2020-09-17 DIAGNOSIS — D500 Iron deficiency anemia secondary to blood loss (chronic): Secondary | ICD-10-CM | POA: Diagnosis not present

## 2020-09-22 DIAGNOSIS — D500 Iron deficiency anemia secondary to blood loss (chronic): Secondary | ICD-10-CM | POA: Diagnosis not present

## 2020-09-22 DIAGNOSIS — Z86010 Personal history of colonic polyps: Secondary | ICD-10-CM | POA: Diagnosis not present

## 2020-09-22 DIAGNOSIS — K219 Gastro-esophageal reflux disease without esophagitis: Secondary | ICD-10-CM | POA: Diagnosis not present

## 2020-09-22 DIAGNOSIS — R195 Other fecal abnormalities: Secondary | ICD-10-CM | POA: Diagnosis not present

## 2020-09-22 DIAGNOSIS — K2971 Gastritis, unspecified, with bleeding: Secondary | ICD-10-CM | POA: Diagnosis not present

## 2020-09-25 DIAGNOSIS — D510 Vitamin B12 deficiency anemia due to intrinsic factor deficiency: Secondary | ICD-10-CM | POA: Diagnosis not present

## 2020-09-28 DIAGNOSIS — G4733 Obstructive sleep apnea (adult) (pediatric): Secondary | ICD-10-CM | POA: Diagnosis not present

## 2020-09-29 DIAGNOSIS — R339 Retention of urine, unspecified: Secondary | ICD-10-CM | POA: Diagnosis not present

## 2020-09-29 DIAGNOSIS — N401 Enlarged prostate with lower urinary tract symptoms: Secondary | ICD-10-CM | POA: Diagnosis not present

## 2020-10-08 DIAGNOSIS — L821 Other seborrheic keratosis: Secondary | ICD-10-CM | POA: Diagnosis not present

## 2020-10-08 DIAGNOSIS — L578 Other skin changes due to chronic exposure to nonionizing radiation: Secondary | ICD-10-CM | POA: Diagnosis not present

## 2020-10-08 DIAGNOSIS — L570 Actinic keratosis: Secondary | ICD-10-CM | POA: Diagnosis not present

## 2020-10-08 DIAGNOSIS — C4442 Squamous cell carcinoma of skin of scalp and neck: Secondary | ICD-10-CM | POA: Diagnosis not present

## 2020-10-08 DIAGNOSIS — C441291 Squamous cell carcinoma of skin of left upper eyelid, including canthus: Secondary | ICD-10-CM | POA: Diagnosis not present

## 2020-10-14 DIAGNOSIS — Z23 Encounter for immunization: Secondary | ICD-10-CM | POA: Diagnosis not present

## 2020-10-26 DIAGNOSIS — D510 Vitamin B12 deficiency anemia due to intrinsic factor deficiency: Secondary | ICD-10-CM | POA: Diagnosis not present

## 2020-10-27 DIAGNOSIS — C4442 Squamous cell carcinoma of skin of scalp and neck: Secondary | ICD-10-CM | POA: Diagnosis not present

## 2020-11-25 DIAGNOSIS — D510 Vitamin B12 deficiency anemia due to intrinsic factor deficiency: Secondary | ICD-10-CM | POA: Diagnosis not present

## 2020-11-30 DIAGNOSIS — G4733 Obstructive sleep apnea (adult) (pediatric): Secondary | ICD-10-CM | POA: Diagnosis not present

## 2020-12-28 DIAGNOSIS — G4733 Obstructive sleep apnea (adult) (pediatric): Secondary | ICD-10-CM | POA: Diagnosis not present

## 2020-12-29 DIAGNOSIS — D510 Vitamin B12 deficiency anemia due to intrinsic factor deficiency: Secondary | ICD-10-CM | POA: Diagnosis not present

## 2020-12-30 DIAGNOSIS — R339 Retention of urine, unspecified: Secondary | ICD-10-CM | POA: Diagnosis not present

## 2020-12-30 DIAGNOSIS — N401 Enlarged prostate with lower urinary tract symptoms: Secondary | ICD-10-CM | POA: Diagnosis not present

## 2021-01-25 DIAGNOSIS — D510 Vitamin B12 deficiency anemia due to intrinsic factor deficiency: Secondary | ICD-10-CM | POA: Diagnosis not present

## 2021-01-28 DIAGNOSIS — C4442 Squamous cell carcinoma of skin of scalp and neck: Secondary | ICD-10-CM | POA: Diagnosis not present

## 2021-01-28 DIAGNOSIS — L570 Actinic keratosis: Secondary | ICD-10-CM | POA: Diagnosis not present

## 2021-02-23 DIAGNOSIS — D510 Vitamin B12 deficiency anemia due to intrinsic factor deficiency: Secondary | ICD-10-CM | POA: Diagnosis not present

## 2021-03-05 DIAGNOSIS — D500 Iron deficiency anemia secondary to blood loss (chronic): Secondary | ICD-10-CM | POA: Diagnosis not present

## 2021-03-09 DIAGNOSIS — R195 Other fecal abnormalities: Secondary | ICD-10-CM | POA: Diagnosis not present

## 2021-03-09 DIAGNOSIS — D500 Iron deficiency anemia secondary to blood loss (chronic): Secondary | ICD-10-CM | POA: Diagnosis not present

## 2021-03-09 DIAGNOSIS — K2971 Gastritis, unspecified, with bleeding: Secondary | ICD-10-CM | POA: Diagnosis not present

## 2021-03-09 DIAGNOSIS — K219 Gastro-esophageal reflux disease without esophagitis: Secondary | ICD-10-CM | POA: Diagnosis not present

## 2021-03-28 DIAGNOSIS — T447X5A Adverse effect of beta-adrenoreceptor antagonists, initial encounter: Secondary | ICD-10-CM | POA: Diagnosis not present

## 2021-03-28 DIAGNOSIS — J8410 Pulmonary fibrosis, unspecified: Secondary | ICD-10-CM | POA: Diagnosis not present

## 2021-03-28 DIAGNOSIS — Z87891 Personal history of nicotine dependence: Secondary | ICD-10-CM | POA: Diagnosis not present

## 2021-03-28 DIAGNOSIS — R0902 Hypoxemia: Secondary | ICD-10-CM | POA: Diagnosis not present

## 2021-03-28 DIAGNOSIS — R001 Bradycardia, unspecified: Secondary | ICD-10-CM | POA: Diagnosis not present

## 2021-03-28 DIAGNOSIS — Z79899 Other long term (current) drug therapy: Secondary | ICD-10-CM | POA: Diagnosis not present

## 2021-03-28 DIAGNOSIS — T887XXA Unspecified adverse effect of drug or medicament, initial encounter: Secondary | ICD-10-CM | POA: Diagnosis not present

## 2021-03-28 DIAGNOSIS — E7800 Pure hypercholesterolemia, unspecified: Secondary | ICD-10-CM | POA: Diagnosis not present

## 2021-03-28 DIAGNOSIS — I712 Thoracic aortic aneurysm, without rupture: Secondary | ICD-10-CM | POA: Diagnosis not present

## 2021-03-28 DIAGNOSIS — N401 Enlarged prostate with lower urinary tract symptoms: Secondary | ICD-10-CM | POA: Diagnosis not present

## 2021-03-28 DIAGNOSIS — J479 Bronchiectasis, uncomplicated: Secondary | ICD-10-CM | POA: Diagnosis not present

## 2021-03-28 DIAGNOSIS — R531 Weakness: Secondary | ICD-10-CM | POA: Diagnosis not present

## 2021-03-28 DIAGNOSIS — M1990 Unspecified osteoarthritis, unspecified site: Secondary | ICD-10-CM | POA: Diagnosis not present

## 2021-03-28 DIAGNOSIS — R300 Dysuria: Secondary | ICD-10-CM | POA: Diagnosis not present

## 2021-03-28 DIAGNOSIS — I10 Essential (primary) hypertension: Secondary | ICD-10-CM | POA: Diagnosis not present

## 2021-03-28 DIAGNOSIS — K449 Diaphragmatic hernia without obstruction or gangrene: Secondary | ICD-10-CM | POA: Diagnosis not present

## 2021-03-29 DIAGNOSIS — G4733 Obstructive sleep apnea (adult) (pediatric): Secondary | ICD-10-CM | POA: Diagnosis not present

## 2021-03-30 ENCOUNTER — Encounter: Payer: Self-pay | Admitting: Cardiology

## 2021-03-30 DIAGNOSIS — R0600 Dyspnea, unspecified: Secondary | ICD-10-CM | POA: Diagnosis not present

## 2021-03-30 DIAGNOSIS — D510 Vitamin B12 deficiency anemia due to intrinsic factor deficiency: Secondary | ICD-10-CM | POA: Insufficient documentation

## 2021-03-30 DIAGNOSIS — E663 Overweight: Secondary | ICD-10-CM | POA: Diagnosis not present

## 2021-03-30 DIAGNOSIS — N401 Enlarged prostate with lower urinary tract symptoms: Secondary | ICD-10-CM | POA: Insufficient documentation

## 2021-03-30 DIAGNOSIS — E538 Deficiency of other specified B group vitamins: Secondary | ICD-10-CM | POA: Insufficient documentation

## 2021-03-30 DIAGNOSIS — K219 Gastro-esophageal reflux disease without esophagitis: Secondary | ICD-10-CM | POA: Insufficient documentation

## 2021-03-30 DIAGNOSIS — E785 Hyperlipidemia, unspecified: Secondary | ICD-10-CM | POA: Insufficient documentation

## 2021-03-30 DIAGNOSIS — G4730 Sleep apnea, unspecified: Secondary | ICD-10-CM | POA: Insufficient documentation

## 2021-03-30 DIAGNOSIS — I4891 Unspecified atrial fibrillation: Secondary | ICD-10-CM | POA: Diagnosis not present

## 2021-03-30 DIAGNOSIS — J302 Other seasonal allergic rhinitis: Secondary | ICD-10-CM | POA: Insufficient documentation

## 2021-03-30 DIAGNOSIS — I10 Essential (primary) hypertension: Secondary | ICD-10-CM | POA: Insufficient documentation

## 2021-03-30 DIAGNOSIS — N289 Disorder of kidney and ureter, unspecified: Secondary | ICD-10-CM | POA: Insufficient documentation

## 2021-03-30 DIAGNOSIS — N138 Other obstructive and reflux uropathy: Secondary | ICD-10-CM | POA: Insufficient documentation

## 2021-03-30 DIAGNOSIS — R5381 Other malaise: Secondary | ICD-10-CM | POA: Diagnosis not present

## 2021-03-30 DIAGNOSIS — D6489 Other specified anemias: Secondary | ICD-10-CM | POA: Insufficient documentation

## 2021-03-30 DIAGNOSIS — I700 Atherosclerosis of aorta: Secondary | ICD-10-CM | POA: Diagnosis not present

## 2021-03-31 ENCOUNTER — Ambulatory Visit (INDEPENDENT_AMBULATORY_CARE_PROVIDER_SITE_OTHER): Payer: Medicare PPO

## 2021-03-31 ENCOUNTER — Ambulatory Visit (INDEPENDENT_AMBULATORY_CARE_PROVIDER_SITE_OTHER): Payer: Medicare PPO | Admitting: Cardiology

## 2021-03-31 ENCOUNTER — Other Ambulatory Visit: Payer: Self-pay

## 2021-03-31 ENCOUNTER — Encounter: Payer: Self-pay | Admitting: Cardiology

## 2021-03-31 VITALS — BP 128/68 | HR 45 | Ht 70.0 in | Wt 199.2 lb

## 2021-03-31 DIAGNOSIS — R0600 Dyspnea, unspecified: Secondary | ICD-10-CM

## 2021-03-31 DIAGNOSIS — G4730 Sleep apnea, unspecified: Secondary | ICD-10-CM

## 2021-03-31 DIAGNOSIS — E782 Mixed hyperlipidemia: Secondary | ICD-10-CM | POA: Diagnosis not present

## 2021-03-31 DIAGNOSIS — I10 Essential (primary) hypertension: Secondary | ICD-10-CM

## 2021-03-31 DIAGNOSIS — I4819 Other persistent atrial fibrillation: Secondary | ICD-10-CM | POA: Insufficient documentation

## 2021-03-31 DIAGNOSIS — R011 Cardiac murmur, unspecified: Secondary | ICD-10-CM

## 2021-03-31 DIAGNOSIS — G4733 Obstructive sleep apnea (adult) (pediatric): Secondary | ICD-10-CM | POA: Diagnosis not present

## 2021-03-31 DIAGNOSIS — J8410 Pulmonary fibrosis, unspecified: Secondary | ICD-10-CM | POA: Diagnosis not present

## 2021-03-31 DIAGNOSIS — I480 Paroxysmal atrial fibrillation: Secondary | ICD-10-CM

## 2021-03-31 DIAGNOSIS — R0609 Other forms of dyspnea: Secondary | ICD-10-CM | POA: Insufficient documentation

## 2021-03-31 HISTORY — DX: Pulmonary fibrosis, unspecified: J84.10

## 2021-03-31 HISTORY — DX: Cardiac murmur, unspecified: R01.1

## 2021-03-31 HISTORY — DX: Paroxysmal atrial fibrillation: I48.0

## 2021-03-31 MED ORDER — APIXABAN 5 MG PO TABS: 5.0000 mg | ORAL_TABLET | Freq: Two times a day (BID) | ORAL | 12 refills | Status: DC

## 2021-03-31 MED ORDER — APIXABAN 5 MG PO TABS: 5.0000 mg | ORAL_TABLET | Freq: Two times a day (BID) | ORAL | 0 refills | Status: DC

## 2021-03-31 NOTE — Progress Notes (Signed)
Cardiology Office Note:    Date:  03/31/2021   ID:  Vincent Wiley, DOB 11-06-1941, MRN [id number]  PCP:  Street, Sharon Mt, MD  Cardiologist:  Jenean Lindau, MD   Referring MD: [id number], Sharon Mt, *    ASSESSMENT:    1. Benign essential hypertension   2. DOE (dyspnea on exertion)   3. Mixed hyperlipidemia   4. Sleep apnea, unspecified type   5. Pulmonary fibrosis (HCC)   6. Cardiac murmur   7. Paroxysmal atrial fibrillation (HCC)    PLAN:    In order of problems listed above:  1. Dyspnea on exertion: This could be multifactorial.  Patient has pulmonary fibrosis and is a under the care of a pulmonary specialist.  I also noted that he has coronary artery calcification on the CT scan and therefore we will do a Lexiscan to see if he has a coronary artery disease that could be obstructive that could be contributing to dyspnea on exertion.  I discussed this with him and his wife and the vocalized understanding and is agreeable. 2. Coronary artery disease: Secondary prevention stressed with the patient.  Importance of compliance with diet medication stressed and she vocalized understanding. 3. Paroxysmal atrial fibrillation:I discussed with the patient atrial fibrillation, disease process. Management and therapy including rate and rhythm control, anticoagulation benefits and potential risks were discussed extensively with the patient. Patient had multiple questions which were answered to patient's satisfaction.  I will get the patient Eliquis 5 mg twice daily.  Benefits and potential risks explained.  We will also check stool for occult blood. 4. Essential hypertension: Blood pressure stable and diet was emphasized. 5. Cardiac murmur: Echocardiogram will be done to assess murmur heard on auscultation. 6. In view of paroxysmal atrial fibrillation I will do a 1 month monitoring to assess his rhythm and atrial fibrillation burden. 7. Mixed dyslipidemia and coronary artery disease: On  statin therapy.  Diet emphasized.  Lipids followed by primary care. 8. Patient and wife had multiple questions which were answered to their satisfaction.   Medication Adjustments/Labs and Tests Ordered: Current medicines are reviewed at length with the patient today.  Concerns regarding medicines are outlined above.  No orders of the defined types were placed in this encounter.  No orders of the defined types were placed in this encounter.    History of Present Illness:    Vincent Wiley is a 80 y.o. male who is being seen today for the evaluation of coronary artery disease and dyspnea on exertion and irregular heartbeat at the request of Street, Sharon Mt, *.  Patient is a pleasant 80 year old male.  He has past medical history of essential hypertension and pulmonary fibrosis.  He went to the hospital recently with sick sinus syndrome.  He has sinus bradycardia and EKG also has revealed atrial fibrillation.  Office records reviewed from primary care also document this.  I reviewed the EKG also and one of the EKGs revealed atrial fibrillation.  At the time of my evaluation is alert awake oriented and in no distress.  He has dyspnea on exertion and this could be multifactorial.  He is here for evaluation for the same reason.  Past Medical History:  Diagnosis Date  . Acid reflux   . Anemia due to multiple mechanisms    Chronic GI losses, B12 Deficiency  . Benign essential hypertension   . BPH with obstruction/lower urinary tract symptoms   . DOE (dyspnea on exertion)   . Hyperlipidemia   .  Kidney disease   . OSA (obstructive sleep apnea) 04/30/2013   NPSG 01/2012:  AHI 27/hr with desat to 86%.   . Pernicious anemia   . Seasonal allergies   . Sleep apnea   . Vitamin B12 deficiency     Past Surgical History:  Procedure Laterality Date  . EYE SURGERY Bilateral    right eye realignment//cataract left eye  . HERNIA REPAIR     umbilical  . MULTIPLE TOOTH EXTRACTIONS    . REPLACEMENT  TOTAL KNEE Bilateral     Current Medications: Current Meds  Medication Sig  . alfuzosin (UROXATRAL) 10 MG 24 hr tablet Take 10 mg by mouth daily with breakfast.  . bethanechol (URECHOLINE) 50 MG tablet Take 50 mg by mouth daily.  . carvedilol (COREG) 12.5 MG tablet Take 12.5 mg by mouth 2 (two) times daily.  . Coenzyme Q10 (COQ10) 100 MG CAPS Take 100 mg by mouth daily.  . famotidine (PEPCID) 40 MG tablet Take 40 mg by mouth daily.  . fluticasone (FLONASE) 50 MCG/ACT nasal spray Place 1 spray into both nostrils 2 (two) times daily.  . Multiple Vitamins-Minerals (VITAMIN D3 COMPLETE PO) Take 1 tablet by mouth daily.  . pantoprazole (PROTONIX) 40 MG tablet Take 40 mg by mouth daily.  . Probiotic Product (PROBIOTIC DAILY PO) Take 1 tablet by mouth daily.  . simvastatin (ZOCOR) 20 MG tablet Take 20 mg by mouth every evening.  . sucralfate (CARAFATE) 1 g tablet Take 1 g by mouth 2 (two) times daily.  Marland Kitchen telmisartan (MICARDIS) 40 MG tablet Take 40 mg by mouth daily.  . valACYclovir (VALTREX) 500 MG tablet Take 500 mg by mouth 2 (two) times daily as needed (Fever blisters).     Allergies:   Sulfamethoxazole, Amlodipine, Ciprofloxacin, Cortisone acetate [cortisone], Elemental sulfur, Flomax [tamsulosin], Hibiclens [chlorhexidine gluconate], Lisinopril, Other, Penicillins, Probiata [lactobacillus], and Rocephin [ceftriaxone sodium in dextrose]   Social History   Socioeconomic History  . Marital status: Unknown    Spouse name: Not on file  . Number of children: Not on file  . Years of education: Not on file  . Highest education level: Not on file  Occupational History  . Occupation: retired  Tobacco Use  . Smoking status: Former Smoker    Packs/day: 1.50    Years: 25.00    Pack years: 37.50    Types: Cigarettes    Quit date: 08/27/1983    Years since quitting: 37.6  . Smokeless tobacco: Never Used  Substance and Sexual Activity  . Alcohol use: No  . Drug use: No  . Sexual activity:  Not on file  Other Topics Concern  . Not on file  Social History Narrative  . Not on file   Social Determinants of Health   Financial Resource Strain: Not on file  Food Insecurity: Not on file  Transportation Needs: Not on file  Physical Activity: Not on file  Stress: Not on file  Social Connections: Not on file     Family History: The patient's family history includes AAA (abdominal aortic aneurysm) in his father; Aneurysm in his mother; Breast cancer in his sister and sister; Heart attack in his father.  ROS:   Please see the history of present illness.    All other systems reviewed and are negative.  EKGs/Labs/Other Studies Reviewed:    The following studies were reviewed today: EKG reveals sinus bradycardia nonspecific ST-T changes   Recent Labs: No results found for requested labs within last 8760 hours.  Recent Lipid Panel No results found for: CHOL, TRIG, HDL, CHOLHDL, VLDL, LDLCALC, LDLDIRECT  Physical Exam:    VS:  BP 128/68   Pulse (!) 45   Ht 5\' 10"  (1.778 m)   Wt 199 lb 3.2 oz (90.4 kg)   SpO2 95%   BMI 28.58 kg/m     Wt Readings from Last 3 Encounters:  03/31/21 199 lb 3.2 oz (90.4 kg)  03/30/21 198 lb (89.8 kg)  04/30/13 214 lb 9.6 oz (97.3 kg)     GEN: Patient is in no acute distress HEENT: Normal NECK: No JVD; No carotid bruits LYMPHATICS: No lymphadenopathy CARDIAC: S1 S2 regular, 2/6 systolic murmur at the apex. RESPIRATORY:  Clear to auscultation without rales, wheezing or rhonchi  ABDOMEN: Soft, non-tender, non-distended MUSCULOSKELETAL:  No edema; No deformity  SKIN: Warm and dry NEUROLOGIC:  Alert and oriented x 3 PSYCHIATRIC:  Normal affect    Signed, Jenean Lindau, MD  03/31/2021 3:26 PM    [facility]

## 2021-03-31 NOTE — Patient Instructions (Addendum)
Medication Instructions:  Your physician has recommended you make the following change in your medication:   Start Eliquis 5 mg twice daily.  *If you need a refill on your cardiac medications before your next appointment, please call your pharmacy*   Lab Work: Your physician recommends that you have your TSH done today in the office.  If you have labs (blood work) drawn today and your tests are completely normal, you will receive your results only by: Marland Kitchen MyChart Message (if you have MyChart) OR . A paper copy in the mail If you have any lab test that is abnormal or we need to change your treatment, we will call you to review the results.   Testing/Procedures: Your physician has requested that you have an echocardiogram. Echocardiography is a painless test that uses sound waves to create images of your heart. It provides your doctor with information about the size and shape of your heart and how well your heart's chambers and valves are working. This procedure takes approximately one hour. There are no restrictions for this procedure.  Your physician has requested that you have a lexiscan myoview. For further information please visit [email or web address]. Please follow instruction sheet, as given.  The test will take approximately 3 to 4 hours to complete; you may bring reading material.  If someone comes with you to your appointment, they will need to remain in the main lobby due to limited space in the testing area.  How to prepare for your Myocardial Perfusion Test: . Do not eat or drink 3 hours prior to your test, except you may have water. . Do not consume products containing caffeine (regular or decaffeinated) 12 hours prior to your test. (ex: coffee, chocolate, sodas, tea). . Do bring a list of your current medications with you.  If not listed below, you may take your medications as normal. . Do wear comfortable clothes (no dresses or overalls) and walking shoes, tennis shoes  preferred (No heels or open toe shoes are allowed). . Do NOT wear cologne, perfume, aftershave, or lotions (deodorant is allowed). . If these instructions are not followed, your test will have to be rescheduled.    Follow-Up: At [facility], you and your health needs are our priority.  As part of our continuing mission to provide you with exceptional heart care, we have created designated Provider Care Teams.  These Care Teams include your primary Cardiologist (physician) and Advanced Practice Providers (APPs -  Physician Assistants and Nurse Practitioners) who all work together to provide you with the care you need, when you need it.  We recommend signing up for the patient portal called "MyChart".  Sign up information is provided on this After Visit Summary.  MyChart is used to connect with patients for Virtual Visits (Telemedicine).  Patients are able to view lab/test results, encounter notes, upcoming appointments, etc.  Non-urgent messages can be sent to your provider as well.   To learn more about what you can do with MyChart, go to [email or web address].    Your next appointment:   2 month(s)  The format for your next appointment:   In Person  Provider:   Jyl Heinz, MD   Other Instructions  Cardiac Nuclear Scan  A cardiac nuclear scan is a test that is done to check the flow of blood to your heart. It is done when you are resting and when you are exercising. The test looks for problems such as:  Not enough blood reaching a portion of the heart.  The heart muscle not working as it should. You may need this test if:  You have heart disease.  You have had lab results that are not normal.  You have had heart surgery or a balloon procedure to open up blocked arteries (angioplasty).  You have chest pain.  You have shortness of breath. In this test, a special dye (tracer) is put into your bloodstream. The tracer will travel to your heart. A camera will then take  pictures of your heart to see how the tracer moves through your heart. This test is usually done at a hospital and takes 2-4 hours. Tell a doctor about:  Any allergies you have.  All medicines you are taking, including vitamins, herbs, eye drops, creams, and over-the-counter medicines.  Any problems you or family members have had with anesthetic medicines.  Any blood disorders you have.  Any surgeries you have had.  Any medical conditions you have.  Whether you are pregnant or may be pregnant. What are the risks? Generally, this is a safe test. However, problems may occur, such as:  Serious chest pain and heart attack. This is only a risk if the stress portion of the test is done.  Rapid heartbeat.  A feeling of warmth in your chest. This feeling usually does not last long.  Allergic reaction to the tracer. What happens before the test?  Ask your doctor about changing or stopping your normal medicines. This is important.  Follow instructions from your doctor about what you cannot eat or drink.  Remove your jewelry on the day of the test. What happens during the test? 1. An IV tube will be inserted into one of your veins. 2. Your doctor will give you a small amount of tracer through the IV tube. 3. You will wait for 20-40 minutes while the tracer moves through your bloodstream. 4. Your heart will be monitored with an electrocardiogram (ECG). 5. You will lie down on an exam table. 6. Pictures of your heart will be taken for about 15-20 minutes. 7. You may also have a stress test. For this test, one of these things may be done: ? You will be asked to exercise on a treadmill or a stationary bike. ? You will be given medicines that will make your heart work harder. This is done if you are unable to exercise. 8. When blood flow to your heart has peaked, a tracer will again be given through the IV tube. 9. After 20-40 minutes, you will get back on the exam table. More pictures  will be taken of your heart. 10. Depending on the tracer that is used, more pictures may need to be taken 3-4 hours later. 11. Your IV tube will be removed when the test is over. The test may vary among doctors and hospitals. What happens after the test? 1. Ask your doctor: ? Whether you can return to your normal schedule, including diet, activities, and medicines. ? Whether you should drink more fluids. This will help to remove the tracer from your body. Drink enough fluid to keep your pee (urine) pale yellow. 2. Ask your doctor, or the department that is doing the test: ? When will my results be ready? ? How will I get my results? Summary  A cardiac nuclear scan is a test that is done to check the flow of blood to your heart.  Tell your doctor whether you are pregnant or may be pregnant.  Before the test, ask your doctor about changing or  stopping your normal medicines. This is important.  Ask your doctor whether you can return to your normal activities. You may be asked to drink more fluids. This information is not intended to replace advice given to you by your health care provider. Make sure you discuss any questions you have with your health care provider. Document Revised: 04/03/2019 Document Reviewed: 05/28/2018 Elsevier Patient Education  [place].  Echocardiogram An echocardiogram is a procedure that uses painless sound waves (ultrasound) to produce an image of the heart. Images from an echocardiogram can provide important information about:  Signs of coronary artery disease (CAD).  Aneurysm detection. An aneurysm is a weak or damaged part of an artery wall that bulges out from the normal force of blood pumping through the body.  Heart size and shape. Changes in the size or shape of the heart can be associated with certain conditions, including heart failure, aneurysm, and CAD.  Heart muscle function.  Heart valve function.  Signs of a past heart  attack.  Fluid buildup around the heart.  Thickening of the heart muscle.  A tumor or infectious growth around the heart valves. Tell a health care provider about:  Any allergies you have.  All medicines you are taking, including vitamins, herbs, eye drops, creams, and over-the-counter medicines.  Any blood disorders you have.  Any surgeries you have had.  Any medical conditions you have.  Whether you are pregnant or may be pregnant. What are the risks? Generally, this is a safe procedure. However, problems may occur, including:  Allergic reaction to dye (contrast) that may be used during the procedure. What happens before the procedure? No specific preparation is needed. You may eat and drink normally. What happens during the procedure?    An IV tube may be inserted into one of your veins.  You may receive contrast through this tube. A contrast is an injection that improves the quality of the pictures from your heart.  A gel will be applied to your chest.  A wand-like tool (transducer) will be moved over your chest. The gel will help to transmit the sound waves from the transducer.  The sound waves will harmlessly bounce off of your heart to allow the heart images to be captured in real-time motion. The images will be recorded on a computer. The procedure may vary among health care providers and hospitals. What happens after the procedure?  You may return to your normal, everyday life, including diet, activities, and medicines, unless your health care provider tells you not to do that. Summary  An echocardiogram is a procedure that uses painless sound waves (ultrasound) to produce an image of the heart.  Images from an echocardiogram can provide important information about the size and shape of your heart, heart muscle function, heart valve function, and fluid buildup around your heart.  You do not need to do anything to prepare before this procedure. You may eat and  drink normally.  After the echocardiogram is completed, you may return to your normal, everyday life, unless your health care provider tells you not to do that. This information is not intended to replace advice given to you by your health care provider. Make sure you discuss any questions you have with your health care provider. Document Revised: 04/04/2019 Document Reviewed: 01/14/2017 Elsevier Patient Education  [place].    Start Amiodarone 400 mg daily for 14 days. You will decrease your dose to 200 mg daily thereafter.  Amiodarone tablets What is  this medicine? AMIODARONE (a MEE oh da rone) is an antiarrhythmic drug. It helps make your heart beat regularly. Because of the side effects caused by this medicine, it is only used when other medicines have not worked. It is usually used for heartbeat problems that may be life threatening. This medicine may be used for other purposes; ask your health care provider or pharmacist if you have questions. COMMON BRAND NAME(S): Cordarone, Pacerone What should I tell my health care provider before I take this medicine? They need to know if you have any of these conditions:  liver disease  lung disease  other heart problems  thyroid disease  an unusual or allergic reaction to amiodarone, iodine, other medicines, foods, dyes, or preservatives  pregnant or trying to get pregnant  breast-feeding How should I use this medicine? Take this medicine by mouth with a glass of water. Follow the directions on the prescription label. You can take this medicine with or without food. However, you should always take it the same way each time. Take your doses at regular intervals. Do not take your medicine more often than directed. Do not stop taking except on the advice of your doctor or health care professional. A special MedGuide will be given to you by the pharmacist with each prescription and refill. Be sure to read this information carefully  each time. Talk to your pediatrician regarding the use of this medicine in children. Special care may be needed. Overdosage: If you think you have taken too much of this medicine contact a poison control center or emergency room at once. NOTE: This medicine is only for you. Do not share this medicine with others. What if I miss a dose? If you miss a dose, take it as soon as you can. If it is almost time for your next dose, take only that dose. Do not take double or extra doses. What may interact with this medicine? Do not take this medicine with any of the following medications:  abarelix  apomorphine  arsenic trioxide  certain antibiotics like erythromycin, gemifloxacin, levofloxacin, pentamidine  certain medicines for depression like amoxapine, tricyclic antidepressants  certain medicines for fungal infections like fluconazole, itraconazole, ketoconazole, posaconazole, voriconazole  certain medicines for irregular heart beat like disopyramide, dronedarone, ibutilide, propafenone, sotalol  certain medicines for malaria like chloroquine, halofantrine  cisapride  droperidol  haloperidol  haw[place]  maprotiline  methadone  phenothiazines like chlorpromazine, mesoridazine, thioridazine  pimozide  ranolazine  red yeast rice  vardenafil This medicine may also interact with the following medications:  antiviral medicines for HIV or AIDS  certain medicines for blood pressure, heart disease, irregular heart beat  certain medicines for cholesterol like atorvastatin, cerivastatin, lovastatin, simvastatin  certain medicines for hepatitis C like sofosbuvir and ledipasvir; sofosbuvir  certain medicines for seizures like phenytoin  certain medicines for thyroid problems  certain medicines that treat or prevent blood clots like warfarin  cholestyramine  cimetidine  clopidogrel  cyclosporine  dextromethorphan  diuretics  dofetilide  fentanyl  general  anesthetics  grapefruit juice  lidocaine  loratadine  methotrexate  other medicines that prolong the QT interval (cause an abnormal heart rhythm)  procainamide  quinidine  rifabutin, rifampin, or rifapentine  St. John's Wort  trazodone  ziprasidone This list may not describe all possible interactions. Give your health care provider a list of all the medicines, herbs, non-prescription drugs, or dietary supplements you use. Also tell them if you smoke, drink alcohol, or use illegal drugs. Some items may  interact with your medicine. What should I watch for while using this medicine? Your condition will be monitored closely when you first begin therapy. Often, this drug is first started in a hospital or other monitored health care setting. Once you are on maintenance therapy, visit your doctor or health care professional for regular checks on your progress. Because your condition and use of this medicine carry some risk, it is a good idea to carry an identification card, necklace or bracelet with details of your condition, medications, and doctor or health care professional. Dennis Bast may get drowsy or dizzy. Do not drive, use machinery, or do anything that needs mental alertness until you know how this medicine affects you. Do not stand or sit up quickly, especially if you are an older patient. This reduces the risk of dizzy or fainting spells. This medicine can make you more sensitive to the sun. Keep out of the sun. If you cannot avoid being in the sun, wear protective clothing and use sunscreen. Do not use sun lamps or tanning beds/booths. You should have regular eye exams before and during treatment. Call your doctor if you have blurred vision, see halos, or your eyes become sensitive to light. Your eyes may get dry. It may be helpful to use a lubricating eye solution or artificial tears solution. If you are going to have surgery or a procedure that requires contrast dyes, tell your doctor or  health care professional that you are taking this medicine. What side effects may I notice from receiving this medicine? Side effects that you should report to your doctor or health care professional as soon as possible:  allergic reactions like skin rash, itching or hives, swelling of the face, lips, or tongue  blue-gray coloring of the skin  blurred vision, seeing blue green halos, increased sensitivity of the eyes to light  breathing problems  chest pain  dark urine  fast, irregular heartbeat  feeling faint or light-headed  intolerance to heat or cold  nausea or vomiting  pain and swelling of the scrotum  pain, tingling, numbness in feet, hands  redness, blistering, peeling or loosening of the skin, including inside the mouth  spitting up blood  stomach pain  sweating  unusual or uncontrolled movements of body  unusually weak or tired  weight gain or loss  yellowing of the eyes or skin Side effects that usually do not require medical attention (report to your doctor or health care professional if they continue or are bothersome):  change in sex drive or performance  constipation  dizziness  headache  loss of appetite  trouble sleeping This list may not describe all possible side effects. Call your doctor for medical advice about side effects. You may report side effects to FDA at 1-800-FDA-1088. Where should I keep my medicine? Keep out of the reach of children. Store at room temperature between 20 and 25 degrees C (68 and 77 degrees F). Protect from light. Keep container tightly closed. Throw away any unused medicine after the expiration date. NOTE: This sheet is a summary. It may not cover all possible information. If you have questions about this medicine, talk to your doctor, pharmacist, or health care provider.  2021 Elsevier/Gold Standard (2018-11-14 13:44:04)

## 2021-04-01 ENCOUNTER — Telehealth: Payer: Self-pay | Admitting: Cardiology

## 2021-04-01 ENCOUNTER — Telehealth (HOSPITAL_COMMUNITY): Payer: Self-pay | Admitting: *Deleted

## 2021-04-01 ENCOUNTER — Telehealth: Payer: Self-pay

## 2021-04-01 LAB — TSH: TSH: 1.27 u[IU]/mL (ref 0.450–4.500)

## 2021-04-01 NOTE — Telephone Encounter (Signed)
-----   Message from Jenean Lindau, MD sent at 04/01/2021  8:09 AM EDT ----- The results of the study is unremarkable. Please inform patient. I will discuss in detail at next appointment. Cc  primary care/referring physician Jenean Lindau, MD 04/01/2021 8:09 AM

## 2021-04-01 NOTE — Addendum Note (Signed)
Addended by: Jyl Heinz R on: 04/01/2021 01:48 PM   Modules accepted: Orders

## 2021-04-01 NOTE — Telephone Encounter (Signed)
Vincent Wiley with [employer] is called to report critical EKG reports. I placed her on hold for 1 minute to prepare to transfer to the nurse, but she hung up the phone.  Please call her back when able at [phone].

## 2021-04-01 NOTE — Telephone Encounter (Signed)
Spoke with patient regarding results and recommendation.  Patient verbalizes understanding and is agreeable to plan of care. Advised patient to call back with any issues or concerns.  

## 2021-04-01 NOTE — Telephone Encounter (Signed)
Pt has had 1 documented rate of 110 atrial fib on his [employer] monitor.

## 2021-04-01 NOTE — Telephone Encounter (Signed)
Patient given detailed instructions per Myocardial Perfusion Study Information Sheet for the test on 04/06/21 at 11:45. Patient notified to arrive 15 minutes early and that it is imperative to arrive on time for appointment to keep from having the test rescheduled.  If you need to cancel or reschedule your appointment, please call the office within 24 hours of your appointment. . Patient verbalized understanding.Vincent Wiley

## 2021-04-01 NOTE — Addendum Note (Signed)
Addended by: Truddie Hidden on: 04/01/2021 11:59 AM   Modules accepted: Orders

## 2021-04-02 ENCOUNTER — Ambulatory Visit (INDEPENDENT_AMBULATORY_CARE_PROVIDER_SITE_OTHER): Payer: Medicare PPO

## 2021-04-02 ENCOUNTER — Other Ambulatory Visit: Payer: Self-pay

## 2021-04-02 DIAGNOSIS — I714 Abdominal aortic aneurysm, without rupture, unspecified: Secondary | ICD-10-CM

## 2021-04-02 DIAGNOSIS — I480 Paroxysmal atrial fibrillation: Secondary | ICD-10-CM

## 2021-04-02 DIAGNOSIS — R011 Cardiac murmur, unspecified: Secondary | ICD-10-CM | POA: Diagnosis not present

## 2021-04-02 LAB — ECHOCARDIOGRAM COMPLETE
Area-P 1/2: 3.53 cm2
S' Lateral: 3.03 cm

## 2021-04-02 NOTE — Progress Notes (Signed)
Complete echocardiogram performed.  Jimmy Natasha Burda RDCS, RVT  

## 2021-04-05 ENCOUNTER — Telehealth: Payer: Self-pay

## 2021-04-05 DIAGNOSIS — I480 Paroxysmal atrial fibrillation: Secondary | ICD-10-CM | POA: Diagnosis not present

## 2021-04-05 DIAGNOSIS — R001 Bradycardia, unspecified: Secondary | ICD-10-CM | POA: Diagnosis not present

## 2021-04-05 DIAGNOSIS — R918 Other nonspecific abnormal finding of lung field: Secondary | ICD-10-CM | POA: Diagnosis not present

## 2021-04-05 NOTE — Telephone Encounter (Signed)
Pt is aware that he needs chest xray and labs done today. Pt verbalized understanding and had no additional questions.

## 2021-04-05 NOTE — Telephone Encounter (Signed)
Pt

## 2021-04-06 ENCOUNTER — Other Ambulatory Visit: Payer: Self-pay

## 2021-04-06 ENCOUNTER — Ambulatory Visit (INDEPENDENT_AMBULATORY_CARE_PROVIDER_SITE_OTHER): Payer: Medicare PPO

## 2021-04-06 ENCOUNTER — Other Ambulatory Visit: Payer: Self-pay | Admitting: Cardiology

## 2021-04-06 VITALS — Ht 70.0 in | Wt 199.0 lb

## 2021-04-06 DIAGNOSIS — T7840XA Allergy, unspecified, initial encounter: Secondary | ICD-10-CM

## 2021-04-06 DIAGNOSIS — I480 Paroxysmal atrial fibrillation: Secondary | ICD-10-CM | POA: Diagnosis not present

## 2021-04-06 DIAGNOSIS — R0609 Other forms of dyspnea: Secondary | ICD-10-CM

## 2021-04-06 DIAGNOSIS — R0600 Dyspnea, unspecified: Secondary | ICD-10-CM | POA: Diagnosis not present

## 2021-04-06 LAB — MYOCARDIAL PERFUSION IMAGING
LV dias vol: 79 mL (ref 62–150)
LV sys vol: 23 mL
Peak HR: 93 {beats}/min
Rest HR: 78 {beats}/min
SDS: 4
SRS: 4
SSS: 6
TID: 0.97

## 2021-04-06 LAB — HEPATIC FUNCTION PANEL
ALT: 11 IU/L (ref 0–44)
AST: 15 IU/L (ref 0–40)
Albumin: 3.7 g/dL (ref 3.7–4.7)
Alkaline Phosphatase: 56 IU/L (ref 44–121)
Bilirubin Total: 0.4 mg/dL (ref 0.0–1.2)
Bilirubin, Direct: 0.14 mg/dL (ref 0.00–0.40)
Total Protein: 6.8 g/dL (ref 6.0–8.5)

## 2021-04-06 MED ORDER — AMIODARONE HCL 200 MG PO TABS: 200.0000 mg | ORAL_TABLET | Freq: Every day | ORAL | 3 refills | Status: DC

## 2021-04-06 MED ORDER — TECHNETIUM TC 99M TETROFOSMIN IV KIT
32.1000 | PACK | Freq: Once | INTRAVENOUS | Status: AC | PRN
  Administered 2021-04-06: 32.1 via INTRAVENOUS

## 2021-04-06 MED ORDER — AMIODARONE HCL 200 MG PO TABS: ORAL_TABLET | ORAL | 0 refills | Status: DC

## 2021-04-06 MED ORDER — REGADENOSON 0.4 MG/5ML IV SOLN
0.4000 mg | Freq: Once | INTRAVENOUS | Status: AC
  Administered 2021-04-06: 0.4 mg via INTRAVENOUS

## 2021-04-06 MED ORDER — DIPHENHYDRAMINE HCL 25 MG PO CAPS: 50.0000 mg | ORAL_CAPSULE | Freq: Four times a day (QID) | ORAL | Status: DC | PRN

## 2021-04-06 MED ORDER — TECHNETIUM TC 99M TETROFOSMIN IV KIT
10.9000 | PACK | Freq: Once | INTRAVENOUS | Status: AC | PRN
  Administered 2021-04-06: 10.9 via INTRAVENOUS

## 2021-04-06 NOTE — Telephone Encounter (Signed)
Pt is aware of dose of Amiodarone as we discussed it during his lexiscan.

## 2021-04-06 NOTE — Progress Notes (Signed)
During MPI, the patient developed whelps and itching all over after the first administration of 49mTc Myoview and 30cc NaCl.  Dr. Harriet Masson ([facility]) consulted with patient and advised Benadryl to be given by their staff RN. Dr. Geraldo Pitter advised to give patient Dextrose in place of NaCl as the flush for the Lexiscan administration and the stress inj of 40mTc Myoview. This was also approved by Tommy Medal, RPH.  Wilcox was also notified.

## 2021-04-06 NOTE — Addendum Note (Signed)
Addended by: Truddie Hidden on: 04/06/2021 02:08 PM   Modules accepted: Orders

## 2021-04-06 NOTE — Addendum Note (Signed)
Addended by: Truddie Hidden on: 04/06/2021 04:08 PM   Modules accepted: Orders

## 2021-04-07 LAB — FECAL OCCULT BLOOD, IMMUNOCHEMICAL: Fecal Occult Bld: NEGATIVE

## 2021-04-08 ENCOUNTER — Telehealth: Payer: Self-pay | Admitting: Cardiology

## 2021-04-08 DIAGNOSIS — D6869 Other thrombophilia: Secondary | ICD-10-CM | POA: Diagnosis not present

## 2021-04-08 DIAGNOSIS — E663 Overweight: Secondary | ICD-10-CM | POA: Diagnosis not present

## 2021-04-08 DIAGNOSIS — Z6829 Body mass index (BMI) 29.0-29.9, adult: Secondary | ICD-10-CM | POA: Diagnosis not present

## 2021-04-08 DIAGNOSIS — I700 Atherosclerosis of aorta: Secondary | ICD-10-CM | POA: Diagnosis not present

## 2021-04-08 DIAGNOSIS — I480 Paroxysmal atrial fibrillation: Secondary | ICD-10-CM | POA: Diagnosis not present

## 2021-04-08 DIAGNOSIS — D6489 Other specified anemias: Secondary | ICD-10-CM | POA: Diagnosis not present

## 2021-04-08 DIAGNOSIS — J8410 Pulmonary fibrosis, unspecified: Secondary | ICD-10-CM | POA: Diagnosis not present

## 2021-04-08 DIAGNOSIS — I25119 Atherosclerotic heart disease of native coronary artery with unspecified angina pectoris: Secondary | ICD-10-CM | POA: Diagnosis not present

## 2021-04-08 NOTE — Telephone Encounter (Signed)
Pt came in today saying that he had received 42 tablets of Amiodarone and would like the rest of his refill and future refills of this med [facility] be [facility]. Please call to advise  [phone]

## 2021-04-08 NOTE — Telephone Encounter (Signed)
Pt aware that his RX has been [facility].

## 2021-05-03 DIAGNOSIS — I700 Atherosclerosis of aorta: Secondary | ICD-10-CM | POA: Diagnosis not present

## 2021-05-03 DIAGNOSIS — I714 Abdominal aortic aneurysm, without rupture: Secondary | ICD-10-CM | POA: Diagnosis not present

## 2021-05-03 DIAGNOSIS — I77810 Thoracic aortic ectasia: Secondary | ICD-10-CM | POA: Diagnosis not present

## 2021-05-04 DIAGNOSIS — D519 Vitamin B12 deficiency anemia, unspecified: Secondary | ICD-10-CM | POA: Diagnosis not present

## 2021-05-06 DIAGNOSIS — S335XXA Sprain of ligaments of lumbar spine, initial encounter: Secondary | ICD-10-CM | POA: Diagnosis not present

## 2021-05-06 DIAGNOSIS — R309 Painful micturition, unspecified: Secondary | ICD-10-CM | POA: Diagnosis not present

## 2021-05-06 DIAGNOSIS — R300 Dysuria: Secondary | ICD-10-CM | POA: Diagnosis not present

## 2021-05-07 ENCOUNTER — Telehealth: Payer: Self-pay

## 2021-05-07 NOTE — Telephone Encounter (Signed)
Results reviewed with pt as per Dr. Julien Nordmann note.  Pt verbalized understanding and had no additional questions. Routed to PCP. Stable aneurysm 1 yr FU

## 2021-05-10 DIAGNOSIS — D6489 Other specified anemias: Secondary | ICD-10-CM | POA: Diagnosis not present

## 2021-05-10 DIAGNOSIS — I480 Paroxysmal atrial fibrillation: Secondary | ICD-10-CM | POA: Diagnosis not present

## 2021-05-10 DIAGNOSIS — I10 Essential (primary) hypertension: Secondary | ICD-10-CM | POA: Diagnosis not present

## 2021-05-10 DIAGNOSIS — E782 Mixed hyperlipidemia: Secondary | ICD-10-CM | POA: Diagnosis not present

## 2021-05-10 DIAGNOSIS — Z79899 Other long term (current) drug therapy: Secondary | ICD-10-CM | POA: Diagnosis not present

## 2021-05-10 DIAGNOSIS — Z0000 Encounter for general adult medical examination without abnormal findings: Secondary | ICD-10-CM | POA: Diagnosis not present

## 2021-05-10 DIAGNOSIS — D6869 Other thrombophilia: Secondary | ICD-10-CM | POA: Diagnosis not present

## 2021-05-10 DIAGNOSIS — I25119 Atherosclerotic heart disease of native coronary artery with unspecified angina pectoris: Secondary | ICD-10-CM | POA: Diagnosis not present

## 2021-05-10 DIAGNOSIS — E559 Vitamin D deficiency, unspecified: Secondary | ICD-10-CM | POA: Diagnosis not present

## 2021-05-12 DIAGNOSIS — R339 Retention of urine, unspecified: Secondary | ICD-10-CM | POA: Diagnosis not present

## 2021-05-12 DIAGNOSIS — N401 Enlarged prostate with lower urinary tract symptoms: Secondary | ICD-10-CM | POA: Diagnosis not present

## 2021-06-04 ENCOUNTER — Other Ambulatory Visit: Payer: Self-pay

## 2021-06-04 DIAGNOSIS — H18791 Other corneal deformities, right eye: Secondary | ICD-10-CM | POA: Diagnosis not present

## 2021-06-04 DIAGNOSIS — D519 Vitamin B12 deficiency anemia, unspecified: Secondary | ICD-10-CM | POA: Diagnosis not present

## 2021-06-04 DIAGNOSIS — H26492 Other secondary cataract, left eye: Secondary | ICD-10-CM | POA: Diagnosis not present

## 2021-06-04 DIAGNOSIS — H04121 Dry eye syndrome of right lacrimal gland: Secondary | ICD-10-CM | POA: Diagnosis not present

## 2021-06-08 ENCOUNTER — Encounter: Payer: Self-pay | Admitting: Cardiology

## 2021-06-08 ENCOUNTER — Other Ambulatory Visit: Payer: Self-pay

## 2021-06-08 ENCOUNTER — Other Ambulatory Visit: Payer: Self-pay | Admitting: Cardiology

## 2021-06-08 ENCOUNTER — Ambulatory Visit (INDEPENDENT_AMBULATORY_CARE_PROVIDER_SITE_OTHER): Payer: Medicare PPO | Admitting: Cardiology

## 2021-06-08 VITALS — BP 138/86 | HR 69 | Ht 70.0 in | Wt 199.0 lb

## 2021-06-08 DIAGNOSIS — E782 Mixed hyperlipidemia: Secondary | ICD-10-CM | POA: Diagnosis not present

## 2021-06-08 DIAGNOSIS — I10 Essential (primary) hypertension: Secondary | ICD-10-CM

## 2021-06-08 DIAGNOSIS — R0600 Dyspnea, unspecified: Secondary | ICD-10-CM

## 2021-06-08 DIAGNOSIS — J8410 Pulmonary fibrosis, unspecified: Secondary | ICD-10-CM

## 2021-06-08 DIAGNOSIS — I7121 Aneurysm of the ascending aorta, without rupture: Secondary | ICD-10-CM

## 2021-06-08 DIAGNOSIS — I2510 Atherosclerotic heart disease of native coronary artery without angina pectoris: Secondary | ICD-10-CM | POA: Insufficient documentation

## 2021-06-08 DIAGNOSIS — R0609 Other forms of dyspnea: Secondary | ICD-10-CM

## 2021-06-08 DIAGNOSIS — I712 Thoracic aortic aneurysm, without rupture: Secondary | ICD-10-CM | POA: Diagnosis not present

## 2021-06-08 DIAGNOSIS — I480 Paroxysmal atrial fibrillation: Secondary | ICD-10-CM

## 2021-06-08 HISTORY — DX: Aneurysm of the ascending aorta, without rupture: I71.21

## 2021-06-08 HISTORY — DX: Atherosclerotic heart disease of native coronary artery without angina pectoris: I25.10

## 2021-06-08 MED ORDER — APIXABAN 5 MG PO TABS
5.0000 mg | ORAL_TABLET | Freq: Two times a day (BID) | ORAL | 3 refills | Status: DC
Start: 2021-06-08 — End: 2022-01-12

## 2021-06-08 MED ORDER — APIXABAN 5 MG PO TABS
5.0000 mg | ORAL_TABLET | Freq: Two times a day (BID) | ORAL | 3 refills | Status: DC
Start: 2021-06-08 — End: 2021-06-08

## 2021-06-08 NOTE — Progress Notes (Signed)
Cardiology Office Note:    Date:  06/08/2021   ID:  Vincent Wiley, DOB [birth year], MRN [id number]  PCP:  Street, Sharon Mt, MD  Cardiologist:  Jenean Lindau, MD   Referring MD: [id number] Sharon Mt, *    ASSESSMENT:    1. Benign essential hypertension   2. Mixed hyperlipidemia   3. Paroxysmal atrial fibrillation (HCC)   4. Atherosclerosis of native coronary artery of native heart without angina pectoris   5. Ascending aortic aneurysm ([place])   6. Pulmonary fibrosis ([place])   7. DOE (dyspnea on exertion)    PLAN:    In order of problems listed above:  Coronary artery disease.  Secondary prevention stressed with the patient.  Importance of compliance with diet medication stressed any vocalized understanding.  His blood work including lipids is followed by primary care. Paroxysmal atrial fibrillation:I discussed with the patient atrial fibrillation, disease process. Management and therapy including rate and rhythm control, anticoagulation benefits and potential risks were discussed extensively with the patient. Patient had multiple questions which were answered to patient's satisfaction.  Patient is on amiodarone 200 mg a day.  Benefits and potential is explained any vocalized understanding.  He does not want to switch to any other medication such as Multaq because of cost and such issues.  I told him that I would prefer him to see a pulmonologist as he is on a significant dose of amiodarone and has pulmonary pathology.  He will need to be evaluated with pulmonary function testing and is agreeable.  We will schedule him for an appointment. Essential hypertension: Blood pressure stable and diet was emphasized. Mixed dyslipidemia: On statin therapy.  Lipids managed by primary care. Ascending aortic aneurysm: CT scan report discussed with the patient.  This is stable.  We will check him on an annual basis. Patient will be seen in follow-up appointment in 6 months or earlier if the patient  has any concerns    Medication Adjustments/Labs and Tests Ordered: Current medicines are reviewed at length with the patient today.  Concerns regarding medicines are outlined above.  No orders of the defined types were placed in this encounter.  No orders of the defined types were placed in this encounter.    No chief complaint on file.    History of Present Illness:    Vincent Wiley is a 80 y.o. male.  Patient has past medical history of paroxysmal atrial fibrillation, essential hypertension, coronary atherosclerosis, dilated ascending aorta and dyslipidemia.  He denies any problems at this time and takes care of activities of daily living.  No chest pain orthopnea or PND.  He has history of pulmonary issues and pulmonary fibrosis.  At the time of my evaluation, the patient is alert awake oriented and in no distress.  Past Medical History:  Diagnosis Date   Acid reflux    Anemia due to multiple mechanisms    Chronic GI losses, B12 Deficiency   Benign essential hypertension    BPH with obstruction/lower urinary tract symptoms    Cardiac murmur 03/31/2021   DOE (dyspnea on exertion)    Hyperlipidemia    Kidney disease    Obstructive sleep apnea syndrome 04/30/2013   NPSG 01/2012:  AHI 27/hr with desat to 86%.   OSA (obstructive sleep apnea) 04/30/2013   NPSG 01/2012:  AHI 27/hr with desat to 86%.    Paroxysmal atrial fibrillation (Vincent Wiley) 03/31/2021   Pernicious anemia    Pulmonary fibrosis ([name]) 03/31/2021   Seasonal allergies  Sleep apnea    Vitamin B12 deficiency     Past Surgical History:  Procedure Laterality Date   EYE SURGERY Bilateral    right eye realignment//cataract left eye   HERNIA REPAIR     umbilical   MULTIPLE TOOTH EXTRACTIONS     REPLACEMENT TOTAL KNEE Bilateral     Current Medications: Current Meds  Medication Sig   alfuzosin (UROXATRAL) 10 MG 24 hr tablet Take 10 mg by mouth daily with breakfast.   amiodarone (PACERONE) 200 MG tablet Take 1 tablet (200 mg  total) by mouth daily.   apixaban (ELIQUIS) 5 MG TABS tablet Take 1 tablet (5 mg total) by mouth 2 (two) times daily.   bethanechol (URECHOLINE) 25 MG tablet Take 25 mg by mouth 2 (two) times daily.   carvedilol (COREG) 12.5 MG tablet Take 12.5 mg by mouth 2 (two) times daily.   Cholecalciferol (D3 2000) 50 MCG (2000 UT) CAPS Take 2,000 Units by mouth daily at 12 noon.   Coenzyme Q10 (COQ10) 100 MG CAPS Take 100 mg by mouth daily.   fluticasone (FLONASE) 50 MCG/ACT nasal spray Place 1 spray into both nostrils 2 (two) times daily.   Multiple Vitamins-Minerals (VITAMIN D3 COMPLETE PO) Take 1 tablet by mouth daily.   pantoprazole (PROTONIX) 40 MG tablet Take 40 mg by mouth 2 (two) times daily.   Probiotic Product (PROBIOTIC DAILY PO) Take 1 tablet by mouth daily.   simvastatin (ZOCOR) 20 MG tablet Take 20 mg by mouth every evening.   sucralfate (CARAFATE) 1 g tablet Take 1 g by mouth 2 (two) times daily.   telmisartan (MICARDIS) 40 MG tablet Take 40 mg by mouth daily.   valACYclovir (VALTREX) 500 MG tablet Take 500 mg by mouth 2 (two) times daily as needed for other (Fever blisters). Fever blisters     Allergies:   Sulfamethoxazole, Amlodipine, Ceftriaxone, Ciprofloxacin, Cortisone acetate [cortisone], Elemental sulfur, Flomax [tamsulosin], Hibiclens [chlorhexidine gluconate], Lisinopril, Other, Penicillins, Probiata [lactobacillus], Rocephin [ceftriaxone sodium in dextrose], and Sulfa antibiotics   Social History   Socioeconomic History   Marital status: Unknown    Spouse name: Not on file   Number of children: Not on file   Years of education: Not on file   Highest education level: Not on file  Occupational History   Occupation: retired  Tobacco Use   Smoking status: Former    Packs/day: 1.50    Years: 25.00    Pack years: 37.50    Types: Cigarettes    Quit date: 08/27/1983    Years since quitting: 37.8   Smokeless tobacco: Never  Substance and Sexual Activity   Alcohol use: No    Drug use: No   Sexual activity: Not on file  Other Topics Concern   Not on file  Social History Narrative   Not on file   Social Determinants of Health   Financial Resource Strain: Not on file  Food Insecurity: Not on file  Transportation Needs: Not on file  Physical Activity: Not on file  Stress: Not on file  Social Connections: Not on file     Family History: The patient's family history includes AAA (abdominal aortic aneurysm) in his father; Aneurysm in his mother; Breast cancer in his sister and sister; Heart attack in his father.  ROS:   Please see the history of present illness.    All other systems reviewed and are negative.  EKGs/Labs/Other Studies Reviewed:    The following studies were reviewed today: EVENT MONITOR REPORT:  Patient was monitored from 03/31/2021 to 04/29/2021. Indication:                    Paroxysmal atrial fibrillation Ordering physician:  Jenean Lindau, MD  Referring physician:        Jenean Lindau, MD      Baseline rhythm: sinus   Minimum heart rate: 36 Sinus rhythm BPM.    Maximal heart rate 111 BPM.   Atrial arrhythmia: PAC's seen. Also noted Atrial flutter and fibrillations episodes with well controlled VR.PVCs noted.   Ventricular arrhythmia: PVC's noted. One 5 beat NSVT   Conduction abnormality: None significant   Symptoms: None significant     Conclusion:  Abnormal monitoring. Paroxysms of atrial flutter and fibrillation with controlled VR noted. One Five beat NSVT seen.   Interpreting  cardiologist: Jenean Lindau, MD  Date: 05/06/2021 4:47 PM  Study Highlights  The left ventricular ejection fraction is hyperdynamic (>65%). Nuclear stress EF: 71%. There was no ST segment deviation noted during stress. No T wave inversion was noted during stress. Defect 1: There is a small fixed defect of mild severity present in the basal inferoseptal location with normal wall motion. The study is normal. The fixed defect as  described above is likely a soft tissue attenuation. This is a low risk study.   IMPRESSIONS     1. Left ventricular ejection fraction, by estimation, is 60 to 65%. The  left ventricle has normal function. The left ventricle has no regional  wall motion abnormalities. There is mild concentric left ventricular  hypertrophy. Left ventricular diastolic  parameters are consistent with Grade III diastolic dysfunction  (restrictive). The average left ventricular global longitudinal strain is  -8.2 %. The global longitudinal strain is abnormal.   2. Right ventricular systolic function is normal. The right ventricular  size is normal. There is mildly elevated pulmonary artery systolic  pressure.   3. Left atrial size was mildly dilated.   4. The mitral valve is normal in structure. Mild to moderate mitral valve  regurgitation. No evidence of mitral stenosis.   5. Tricuspid valve regurgitation is moderate.   6. The aortic valve is calcified. Aortic valve regurgitation is not  visualized. Mild aortic valve sclerosis is present, with no evidence of  aortic valve stenosis.   7. The aortic root is moderately calcified. Aneurysm of the At the level  of the sinus of valsalva, measuring 42 mm. Aneurysm of the ascending  aorta, measuring 40 mm.   8. Evidence of atrial level shunting detected by color flow Doppler.  There is a secundum atrial septal defect.   9. The inferior vena cava is normal in size with greater than 50%  respiratory variability, suggesting right atrial pressure of 3 mmHg.  Recent Labs: 03/31/2021: TSH 1.270 04/05/2021: ALT 11  Recent Lipid Panel No results found for: CHOL, TRIG, HDL, CHOLHDL, VLDL, LDLCALC, LDLDIRECT  Physical Exam:    VS:  BP 138/86   Pulse 69   Ht 5\' 10"  (1.778 m)   Wt 199 lb (90.3 kg)   SpO2 93%   BMI 28.55 kg/m     Wt Readings from Last 3 Encounters:  06/08/21 199 lb (90.3 kg)  04/06/21 199 lb (90.3 kg)  03/31/21 199 lb 3.2 oz (90.4 kg)      GEN: Patient is in no acute distress HEENT: Normal NECK: No JVD; No carotid bruits LYMPHATICS: No lymphadenopathy CARDIAC: Hear sounds regular, 2/6 systolic murmur at the apex. RESPIRATORY:  Clear to auscultation without rales, wheezing or rhonchi  ABDOMEN: Soft, non-tender, non-distended MUSCULOSKELETAL:  No edema; No deformity  SKIN: Warm and dry NEUROLOGIC:  Alert and oriented x 3 PSYCHIATRIC:  Normal affect   Signed, Jenean Lindau, MD  06/08/2021 1:31 PM    [facility]

## 2021-06-08 NOTE — Patient Instructions (Signed)
Medication Instructions:  No medication changes. *If you need a refill on your cardiac medications before your next appointment, please call your pharmacy*   Lab Work: Your physician recommends that you have labs done in the office today. Your test included  basic metabolic panel, TSH and liver function.  If you have labs (blood work) drawn today and your tests are completely normal, you will receive your results only by: [place] (if you have MyChart) OR A paper copy in the mail If you have any lab test that is abnormal or we need to change your treatment, we will call you to review the results.   Testing/Procedures: None ordered   Follow-Up: At [facility], you and your health needs are our priority.  As part of our continuing mission to provide you with exceptional heart care, we have created designated Provider Care Teams.  These Care Teams include your primary Cardiologist (physician) and Advanced Practice Providers (APPs -  Physician Assistants and Nurse Practitioners) who all work together to provide you with the care you need, when you need it.  We recommend signing up for the patient portal called "MyChart".  Sign up information is provided on this After Visit Summary.  MyChart is used to connect with patients for Virtual Visits (Telemedicine).  Patients are able to view lab/test results, encounter notes, upcoming appointments, etc.  Non-urgent messages can be sent to your provider as well.   To learn more about what you can do with MyChart, go to [email or web address].    Your next appointment:   6 month(s)  The format for your next appointment:   In Person  Provider:   Jyl Heinz, MD   Other Instructions NA

## 2021-06-09 ENCOUNTER — Encounter: Payer: Self-pay | Admitting: Pulmonary Disease

## 2021-06-09 LAB — HEPATIC FUNCTION PANEL
ALT: 10 IU/L (ref 0–44)
AST: 15 IU/L (ref 0–40)
Albumin: 4.2 g/dL (ref 3.7–4.7)
Alkaline Phosphatase: 52 IU/L (ref 44–121)
Bilirubin Total: 0.2 mg/dL (ref 0.0–1.2)
Bilirubin, Direct: 0.11 mg/dL (ref 0.00–0.40)
Total Protein: 7.1 g/dL (ref 6.0–8.5)

## 2021-06-09 LAB — BASIC METABOLIC PANEL
BUN/Creatinine Ratio: 18 (ref 10–24)
BUN: 21 mg/dL (ref 8–27)
CO2: 18 mmol/L — ABNORMAL LOW (ref 20–29)
Calcium: 9.1 mg/dL (ref 8.6–10.2)
Chloride: 110 mmol/L — ABNORMAL HIGH (ref 96–106)
Creatinine, Ser: 1.2 mg/dL (ref 0.76–1.27)
Glucose: 122 mg/dL — ABNORMAL HIGH (ref 65–99)
Potassium: 4.7 mmol/L (ref 3.5–5.2)
Sodium: 145 mmol/L — ABNORMAL HIGH (ref 134–144)
eGFR: 61 mL/min/{1.73_m2} (ref >59)

## 2021-06-09 LAB — TSH: TSH: 1.87 u[IU]/mL (ref 0.450–4.500)

## 2021-06-29 DIAGNOSIS — E538 Deficiency of other specified B group vitamins: Secondary | ICD-10-CM | POA: Diagnosis not present

## 2021-06-29 DIAGNOSIS — G4733 Obstructive sleep apnea (adult) (pediatric): Secondary | ICD-10-CM | POA: Diagnosis not present

## 2021-07-22 ENCOUNTER — Encounter: Payer: Self-pay | Admitting: Pulmonary Disease

## 2021-07-22 ENCOUNTER — Other Ambulatory Visit: Payer: Self-pay

## 2021-07-22 ENCOUNTER — Ambulatory Visit (INDEPENDENT_AMBULATORY_CARE_PROVIDER_SITE_OTHER): Payer: Medicare PPO | Admitting: Pulmonary Disease

## 2021-07-22 VITALS — BP 144/78 | HR 85 | Ht 70.0 in | Wt 200.4 lb

## 2021-07-22 DIAGNOSIS — J849 Interstitial pulmonary disease, unspecified: Secondary | ICD-10-CM

## 2021-07-22 NOTE — Progress Notes (Signed)
Vincent Wiley    US:[id number]    [birth year]  Primary Care Physician:Street, Sharon Mt, MD  Referring Physician: Jenean Lindau, [address] [place] ,  [place] [id number]  Chief complaint:  Consult for dyspnea  HPI: 80 year old with history of coronary artery disease, paroxysmal atrial fibrillation, hypertension, dyslipidemia. Follows with Dr. Geraldo Pitter, cardiology.  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  No feather pillows or comforters Smoking history: 26-pack-year smoker.  Quit in 1984 Travel history: No significant travel history Relevant family history: No family history of lung disease   Outpatient Encounter Medications as of 07/22/2021  Medication Sig   alfuzosin (UROXATRAL) 10 MG 24 hr tablet Take 10 mg by mouth daily with breakfast.   amiodarone (PACERONE) 200 MG tablet Take 1 tablet (200 mg total) by mouth daily.   apixaban (ELIQUIS) 5 MG TABS tablet Take 1 tablet (5 mg total) by mouth 2 (two) times daily.   bethanechol (URECHOLINE) 25 MG tablet Take 25 mg by mouth 2 (two) times daily.   carvedilol (COREG) 12.5 MG tablet Take 12.5 mg by mouth 2 (two) times daily.   Cholecalciferol (D3 2000) 50 MCG (2000 UT) CAPS Take 2,000 Units by mouth daily at 12 noon.   Coenzyme Q10 (COQ10) 100 MG CAPS Take 100 mg by mouth daily.   fluticasone (FLONASE) 50 MCG/ACT nasal spray Place 1 spray into both nostrils 2 (two) times daily.   Multiple Vitamins-Minerals (VITAMIN D3 COMPLETE PO) Take 1 tablet by mouth daily.   pantoprazole (PROTONIX) 40 MG tablet Take 40 mg by mouth 2 (two) times daily.   Probiotic Product  (PROBIOTIC DAILY PO) Take 1 tablet by mouth daily.   simvastatin (ZOCOR) 20 MG tablet Take 20 mg by mouth every evening.   sucralfate (CARAFATE) 1 g tablet Take 1 g by mouth 2 (two) times daily.   telmisartan (MICARDIS) 40 MG tablet Take 40 mg by mouth daily.   valACYclovir (VALTREX) 500 MG tablet Take 500 mg by mouth 2 (two) times daily as needed for other (Fever blisters). Fever blisters (Patient not taking: Reported on 07/22/2021)   No facility-administered encounter medications on file as of 07/22/2021.    Allergies as of 07/22/2021 - Review Complete 07/22/2021  Allergen Reaction Noted   Sulfamethoxazole Other (See Comments) 03/30/2021   Amlodipine  03/31/2021   Ceftriaxone  11/30/2020   Ciprofloxacin Itching 03/30/2021   Cortisone acetate [cortisone] Hives 03/30/2021   Elemental sulfur  04/30/2013   Flomax [tamsulosin] Other (See Comments) 03/30/2021   Hibiclens [chlorhexidine gluconate]  04/30/2013   Lisinopril  04/30/2013   Other  04/30/2013   Penicillins  04/30/2013   Probiata [lactobacillus]  03/30/2021   Rocephin [ceftriaxone sodium in dextrose]  04/30/2013   Sulfa antibiotics  11/30/2020    Past Medical History:  Diagnosis Date   Acid reflux    Anemia due to multiple mechanisms    Chronic GI losses, B12 Deficiency   Benign essential hypertension    BPH with obstruction/lower urinary tract symptoms    Cardiac murmur 03/31/2021   DOE (dyspnea on exertion)    Hyperlipidemia  Kidney disease    Obstructive sleep apnea syndrome 04/30/2013   NPSG 01/2012:  AHI 27/hr with desat to 86%.   OSA (obstructive sleep apnea) 04/30/2013   NPSG 01/2012:  AHI 27/hr with desat to 86%.    Paroxysmal atrial fibrillation ([place]) 03/31/2021   Pernicious anemia    Pulmonary fibrosis ([place]) 03/31/2021   Seasonal allergies    Sleep apnea    Vitamin B12 deficiency     Past Surgical History:  Procedure Laterality Date   EYE SURGERY Bilateral    right eye realignment//cataract left eye   HERNIA  REPAIR     umbilical   MULTIPLE TOOTH EXTRACTIONS     REPLACEMENT TOTAL KNEE Bilateral     Family History  Problem Relation Age of Onset   Breast cancer Sister    Breast cancer Sister    Heart attack Father        deceased from MI   AAA (abdominal aortic aneurysm) Father    Aneurysm Mother        deceased from rupture    Social History   Socioeconomic History   Marital status: Unknown    Spouse name: Not on file   Number of children: Not on file   Years of education: Not on file   Highest education level: Not on file  Occupational History   Occupation: retired  Tobacco Use   Smoking status: Former    Packs/day: 1.50    Years: 25.00    Pack years: 37.50    Types: Cigarettes    Quit date: 08/27/1983    Years since quitting: 37.9   Smokeless tobacco: Never  Substance and Sexual Activity   Alcohol use: No   Drug use: No   Sexual activity: Not on file  Other Topics Concern   Not on file  Social History Narrative   Not on file   Social Determinants of Health   Financial Resource Strain: Not on file  Food Insecurity: Not on file  Transportation Needs: Not on file  Physical Activity: Not on file  Stress: Not on file  Social Connections: Not on file  Intimate Partner Violence: Not on file    Review of systems: Review of Systems  Constitutional: Negative for fever and chills.  HENT: Negative.   Eyes: Negative for blurred vision.  Respiratory: as per HPI  Cardiovascular: Negative for chest pain and palpitations.  Gastrointestinal: Negative for vomiting, diarrhea, blood per rectum. Genitourinary: Negative for dysuria, urgency, frequency and hematuria.  Musculoskeletal: Negative for myalgias, back pain and joint pain.  Skin: Negative for itching and rash.  Neurological: Negative for dizziness, tremors, focal weakness, seizures and loss of consciousness.  Endo/Heme/Allergies: Negative for environmental allergies.  Psychiatric/Behavioral: Negative for depression,  suicidal ideas and hallucinations.  All other systems reviewed and are negative.  Physical Exam: Blood pressure (!) 144/78, pulse 85, height '5\' 10"'$  (1.778 m), weight 200 lb 6.4 oz (90.9 kg), SpO2 97 %. Gen:      No acute distress HEENT:  EOMI, sclera anicteric Neck:     No masses; no thyromegaly Lungs:    Minimal bibasilar crackles CV:         Regular rate and rhythm; no murmurs Abd:      + bowel sounds; soft, non-tender; no palpable masses, no distension Ext:    No edema; adequate peripheral perfusion Skin:      Warm and dry; no rash Neuro: alert and oriented x 3 Psych: normal mood and affect  Data Reviewed:  Imaging: High-res CT 03/28/2021-pulmonary fibrosis in UIP pattern.  Honeycombing present, bronchial wall thickening, emphysema, coronary artery disease, aortic aneurysm, enlargement of pulmonary artery  CT chest 05/03/2021-redemonstrated pulmonary fibrosis I have reviewed the images personally.  Cardiac: Echocardiogram 04/03/2019- LVEF 60 to 65%, mild concentric LVH, grade 3 diastolic dysfunction, mildly elevated PA systolic pressure, ASD  PFTs:  Labs:  Assessment:  Pulmonary fibrosis I have reviewed prior CT scans and typical UIP pattern with honeycombing.  This is likely to be idiopathic pulmonary fibrosis in the absence of any exposures or signs and symptoms of connective tissue disease  We will send some serologies for work-up, schedule pulmonary function tests Return to clinic in 1 to 2 months for discussion of therapy He may be a candidate for antifibrotics to slow down progression of disease  Pulmonary hypertension He has mild elevation in PA systolic pressure on echocardiogram This is likely secondary to left heart disease with diastolic dysfunction and OSA Continue monitoring  Obstructive sleep apnea Compliant with CPAP.  Follows with Dr. Maxwell Caul  Plan/Recommendations: Serologies for ILD work-up, pulmonary function test  [name] MD [facility] 07/22/2021, 11:01 AM  CC: Revankar, Reita Cliche, MD

## 2021-07-22 NOTE — Patient Instructions (Addendum)
We get some labs and pulmonary function test for further evaluation of the lung Return to clinic after these tests for review and plan for next steps.

## 2021-07-22 NOTE — Addendum Note (Signed)
Addended by: Suzzanne Cloud E on: 07/22/2021 11:41 AM   Modules accepted: Orders

## 2021-07-26 DIAGNOSIS — E538 Deficiency of other specified B group vitamins: Secondary | ICD-10-CM | POA: Diagnosis not present

## 2021-07-26 LAB — ANTI-SCLERODERMA ANTIBODY: Scleroderma (Scl-70) (ENA) Antibody, IgG: <1 AI

## 2021-07-26 LAB — ANCA SCREEN W REFLEX TITER: ANCA Screen: NEGATIVE

## 2021-07-26 LAB — ANA,IFA RA DIAG PNL W/RFLX TIT/PATN
Anti Nuclear Antibody (ANA): NEGATIVE
Cyclic Citrullin Peptide Ab: <16 UNITS
Rheumatoid fact SerPl-aCnc: <14 IU/mL (ref <14)

## 2021-07-26 LAB — SJOGREN'S SYNDROME ANTIBODS(SSA + SSB)
SSA (Ro) (ENA) Antibody, IgG: <1 AI
SSB (La) (ENA) Antibody, IgG: <1 AI

## 2021-07-28 LAB — HYPERSENSITIVITY PNEUMONITIS
A. Pullulans Abs: NEGATIVE
A.Fumigatus #1 Abs: NEGATIVE
Micropolyspora faeni, IgG: NEGATIVE
Pigeon Serum Abs: NEGATIVE
Thermoact. Saccharii: NEGATIVE
Thermoactinomyces vulgaris, IgG: NEGATIVE

## 2021-07-29 DIAGNOSIS — L570 Actinic keratosis: Secondary | ICD-10-CM | POA: Diagnosis not present

## 2021-07-29 DIAGNOSIS — L578 Other skin changes due to chronic exposure to nonionizing radiation: Secondary | ICD-10-CM | POA: Diagnosis not present

## 2021-07-30 DIAGNOSIS — I480 Paroxysmal atrial fibrillation: Secondary | ICD-10-CM | POA: Diagnosis not present

## 2021-07-30 DIAGNOSIS — I10 Essential (primary) hypertension: Secondary | ICD-10-CM | POA: Diagnosis not present

## 2021-07-30 DIAGNOSIS — I700 Atherosclerosis of aorta: Secondary | ICD-10-CM | POA: Diagnosis not present

## 2021-07-30 DIAGNOSIS — J8410 Pulmonary fibrosis, unspecified: Secondary | ICD-10-CM | POA: Diagnosis not present

## 2021-07-30 DIAGNOSIS — Z6829 Body mass index (BMI) 29.0-29.9, adult: Secondary | ICD-10-CM | POA: Diagnosis not present

## 2021-07-30 DIAGNOSIS — E663 Overweight: Secondary | ICD-10-CM | POA: Diagnosis not present

## 2021-07-30 DIAGNOSIS — D6869 Other thrombophilia: Secondary | ICD-10-CM | POA: Diagnosis not present

## 2021-07-30 DIAGNOSIS — I25119 Atherosclerotic heart disease of native coronary artery with unspecified angina pectoris: Secondary | ICD-10-CM | POA: Diagnosis not present

## 2021-07-30 DIAGNOSIS — R0989 Other specified symptoms and signs involving the circulatory and respiratory systems: Secondary | ICD-10-CM | POA: Diagnosis not present

## 2021-08-05 LAB — MYOMARKER 3 PLUS PROFILE (RDL)

## 2021-08-25 DIAGNOSIS — D519 Vitamin B12 deficiency anemia, unspecified: Secondary | ICD-10-CM | POA: Diagnosis not present

## 2021-09-03 ENCOUNTER — Ambulatory Visit (INDEPENDENT_AMBULATORY_CARE_PROVIDER_SITE_OTHER): Payer: Medicare PPO | Admitting: Pulmonary Disease

## 2021-09-03 ENCOUNTER — Encounter: Payer: Self-pay | Admitting: Pulmonary Disease

## 2021-09-03 ENCOUNTER — Other Ambulatory Visit: Payer: Self-pay

## 2021-09-03 VITALS — BP 122/62 | HR 70 | Temp 97.8°F | Ht 67.5 in | Wt 196.7 lb

## 2021-09-03 DIAGNOSIS — J849 Interstitial pulmonary disease, unspecified: Secondary | ICD-10-CM

## 2021-09-03 LAB — PULMONARY FUNCTION TEST
DL/VA % pred: 70 %
DL/VA: 2.78 ml/min/mmHg/L
DLCO cor % pred: 59 %
DLCO cor: 13.51 ml/min/mmHg
DLCO unc % pred: 59 %
DLCO unc: 13.51 ml/min/mmHg
FEF 25-75 Post: 4.38 L/sec
FEF 25-75 Pre: 4.49 L/sec
FEF2575-%Change-Post: -2 %
FEF2575-%Pred-Post: 250 %
FEF2575-%Pred-Pre: 257 %
FEV1-%Change-Post: 0 %
FEV1-%Pred-Post: 126 %
FEV1-%Pred-Pre: 125 %
FEV1-Post: 3.22 L
FEV1-Pre: 3.2 L
FEV1FVC-%Change-Post: 1 %
FEV1FVC-%Pred-Pre: 119 %
FEV6-%Change-Post: 0 %
FEV6-%Pred-Post: 110 %
FEV6-%Pred-Pre: 111 %
FEV6-Post: 3.7 L
FEV6-Pre: 3.73 L
FEV6FVC-%Change-Post: 0 %
FEV6FVC-%Pred-Post: 107 %
FEV6FVC-%Pred-Pre: 107 %
FVC-%Change-Post: 0 %
FVC-%Pred-Post: 102 %
FVC-%Pred-Pre: 103 %
FVC-Post: 3.7 L
FVC-Pre: 3.73 L
Post FEV1/FVC ratio: 87 %
Post FEV6/FVC ratio: 100 %
Pre FEV1/FVC ratio: 86 %
Pre FEV6/FVC Ratio: 100 %
RV % pred: 56 %
RV: 1.43 L
TLC % pred: 79 %
TLC: 5.21 L

## 2021-09-03 NOTE — Progress Notes (Signed)
Vincent Wiley    [id number]    04-06-1941  Primary Care Physician:Street, Vincent Mt, MD  Referring Physician: Venetia Wiley, Vincent Mt, MD [name],  Vincent Wiley  Chief complaint: Follow-up for pulmonary fibrosis  HPI: 80 year old with history of coronary artery disease, paroxysmal atrial fibrillation, hypertension, dyslipidemia. Follows with Dr. Geraldo Wiley, cardiology.  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  No feather pillows or comforters Smoking history: 26-pack-year smoker.  Quit in 1984 Travel history: No significant travel history Relevant family history: No family history of lung disease  Interim history: Here for review of labs.  States that he continues to have dyspnea on exertion  Outpatient Encounter Medications as of 09/03/2021  Medication Sig   alfuzosin (UROXATRAL) 10 MG 24 hr tablet Take 10 mg by mouth daily with breakfast.   amiodarone (PACERONE) 200 MG tablet Take 1 tablet (200 mg total) by mouth daily.   apixaban (ELIQUIS) 5 MG TABS tablet Take 1 tablet (5 mg total) by mouth 2 (two) times daily.   bethanechol (URECHOLINE) 25 MG tablet Take 25 mg by mouth 2 (two) times daily.   Cholecalciferol (D3 2000) 50 MCG (2000 UT) CAPS Take 2,000 Units by mouth daily at 12 noon.   Coenzyme Q10 (COQ10) 100 MG CAPS Take 100 mg by mouth daily.   fluticasone (FLONASE) 50 MCG/ACT nasal spray Place 1 spray into both nostrils 2 (two) times daily.   Multiple Vitamins-Minerals (VITAMIN D3 COMPLETE PO) Take 1 tablet by mouth daily.   pantoprazole (PROTONIX) 40 MG tablet Take 40 mg by mouth 2 (two) times daily.    Probiotic Product (PROBIOTIC DAILY PO) Take 1 tablet by mouth daily.   simvastatin (ZOCOR) 20 MG tablet Take 20 mg by mouth every evening.   sucralfate (CARAFATE) 1 g tablet Take 1 g by mouth 2 (two) times daily.   telmisartan (MICARDIS) 20 MG tablet Take 10 mg by mouth daily.   carvedilol (COREG) 12.5 MG tablet Take 12.5 mg by mouth 2 (two) times daily. (Patient not taking: Reported on 09/03/2021)   valACYclovir (VALTREX) 500 MG tablet Take 500 mg by mouth 2 (two) times daily as needed for other (Fever blisters). Fever blisters (Patient not taking: Reported on 09/03/2021)   [DISCONTINUED] telmisartan (MICARDIS) 40 MG tablet Take 10 mg by mouth daily.   No facility-administered encounter medications on file as of 09/03/2021.    Physical Exam: Blood pressure 122/62, pulse 70, temperature 97.8 F (36.6 C), temperature source Oral, height 5' 7.5" (1.715 m), weight 196 lb 11.2 oz (89.2 kg), SpO2 96 %. Gen:      No acute distress HEENT:  EOMI, sclera anicteric Neck:     No masses; no thyromegaly Lungs:    Bibasal crackles CV:         Regular rate and rhythm; no murmurs Abd:      + bowel sounds; soft, non-tender; no palpable masses, no distension Ext:    No edema; adequate peripheral perfusion Skin:      Warm and dry; no rash Neuro: alert and oriented x 3 Psych: normal mood and affect   Data Reviewed: Imaging: High-res  CT 03/28/2021-pulmonary fibrosis in UIP pattern.  Honeycombing present, bronchial wall thickening, emphysema, coronary artery disease, aortic aneurysm, enlargement of pulmonary artery  CT chest 05/03/2021-redemonstrated pulmonary fibrosis I have reviewed the images personally.  Cardiac: Echocardiogram 04/03/2019- LVEF 60 to 65%, mild concentric LVH, grade 3 diastolic dysfunction, mildly elevated PA systolic pressure, ASD  PFTs: 09/03/2021 FVC 2.70 [102%], FEV1 3.22 [126%], F/F 87, TLC 5.21 [79%], DLCO 13.51 [59%] Severe diffusion defect  Labs: CTD serologies  09/03/2021-negative  Assessment:  Pulmonary fibrosis, IPF I have reviewed prior CT scans and typical UIP pattern with honeycombing.  This is likely to be idiopathic pulmonary fibrosis in the absence of any exposures or signs and symptoms of connective tissue disease.  CTD serologies are negative  Discussed findings and diagnosis with patient No need for biopsy as he diagnosis of IPF is fairly certain We discussed antifibrotic's and have decided to initiate Esbriet LFTs earlier this year are negative.  Referral to pulmonary rehab  Pulmonary hypertension He has mild elevation in PA systolic pressure on echocardiogram This is likely secondary to left heart disease with diastolic dysfunction and OSA May need right heart cath later  Obstructive sleep apnea Compliant with CPAP.  Follows with Dr. Maxwell Wiley  Plan/Recommendations: Start Esbriet Referral to pulmonary rehab  [name] MD [facility] 09/03/2021, 1:40 PM  CC: Street, [place], [place]

## 2021-09-03 NOTE — Progress Notes (Signed)
PFT done today. 

## 2021-09-03 NOTE — Patient Instructions (Signed)
I have reviewed your test that show scarring in the lung.  You likely have a condition called idiopathic pulmonary fibrosis or IPF  We get you started on a medication called Esbriet Referral to pulmonary rehab in [place]  Follow-up in 2 to 3 months.

## 2021-09-03 NOTE — Addendum Note (Signed)
Addended by: Elton Sin on: 09/03/2021 02:08 PM   Modules accepted: Orders

## 2021-09-10 ENCOUNTER — Telehealth: Payer: Self-pay

## 2021-09-10 ENCOUNTER — Other Ambulatory Visit (HOSPITAL_COMMUNITY): Payer: Self-pay

## 2021-09-10 NOTE — Telephone Encounter (Signed)
Received New start paperwork for ESBRIET. Will update as we work through the benefits process.  Received notification from [place] regarding a prior authorization for PIRFENIDONE. Authorization has been APPROVED from 08/11/2021 to 09/10/2022.   Patient must fill through [facility] (pulmonary fibrosis team). [phone]  Authorization # [id number]  Signed patient and provider PAP forms on-hand, will await approval letter for submission.

## 2021-09-13 DIAGNOSIS — N401 Enlarged prostate with lower urinary tract symptoms: Secondary | ICD-10-CM | POA: Diagnosis not present

## 2021-09-13 DIAGNOSIS — R339 Retention of urine, unspecified: Secondary | ICD-10-CM | POA: Diagnosis not present

## 2021-09-14 DIAGNOSIS — J849 Interstitial pulmonary disease, unspecified: Secondary | ICD-10-CM | POA: Diagnosis not present

## 2021-09-15 ENCOUNTER — Other Ambulatory Visit (HOSPITAL_COMMUNITY): Payer: Self-pay

## 2021-09-15 NOTE — Telephone Encounter (Signed)
Submitted Patient Assistance Application to [employer] for ESBRIET along with provider portion, PA and income documents. Will update patient when we receive a response.  Fax# [phone] Phone# [phone] 

## 2021-09-15 NOTE — Telephone Encounter (Signed)
Spoke with rep at [employer] who is faxing confirmation letter to [phone]

## 2021-09-17 DIAGNOSIS — J849 Interstitial pulmonary disease, unspecified: Secondary | ICD-10-CM | POA: Diagnosis not present

## 2021-09-20 DIAGNOSIS — J849 Interstitial pulmonary disease, unspecified: Secondary | ICD-10-CM | POA: Diagnosis not present

## 2021-09-21 NOTE — Telephone Encounter (Signed)
Received fax from [place] that prescriber form for Esbriet PAP application was illegible. Completed form in darker ink and refaxed  Fax: [phone]

## 2021-09-22 DIAGNOSIS — J849 Interstitial pulmonary disease, unspecified: Secondary | ICD-10-CM | POA: Diagnosis not present

## 2021-09-22 DIAGNOSIS — Z23 Encounter for immunization: Secondary | ICD-10-CM | POA: Diagnosis not present

## 2021-09-22 DIAGNOSIS — E538 Deficiency of other specified B group vitamins: Secondary | ICD-10-CM | POA: Diagnosis not present

## 2021-09-24 ENCOUNTER — Telehealth: Payer: Self-pay | Admitting: Pulmonary Disease

## 2021-09-24 DIAGNOSIS — R0609 Other forms of dyspnea: Secondary | ICD-10-CM

## 2021-09-24 DIAGNOSIS — J849 Interstitial pulmonary disease, unspecified: Secondary | ICD-10-CM | POA: Diagnosis not present

## 2021-09-24 NOTE — Telephone Encounter (Signed)
Noted. We can start him on supplemental oxygen.  Please find out from pulmonary rehab how many liters oxygen he will need to be on

## 2021-09-24 NOTE — Telephone Encounter (Signed)
Patient is desaturating during Pulm Rehab. They also did a 6 minute walk and determined he needed oxygen. They are going to fax over results of 6 minute walk as well as additional noted.   LMTCB with patient.   PM please be advised Pulm Rehab determined patient needed oxygen during rehab and while doing 6 minute walk. I left message with patient to check on him.  Will hold in triage given nature of message.

## 2021-09-24 NOTE — Telephone Encounter (Signed)
Received fax from pulmonary rehab. Per the fax, patient ambulated 900FT on RA. O2 dropped to 86%. He was placed on 2L of O2. His O2 recovered to 97%. He was able to continue the walk on 2L without any more desats.

## 2021-09-24 NOTE — Telephone Encounter (Signed)
OK. Please order 2 Lt O2 with exertion and nightime

## 2021-09-27 DIAGNOSIS — J849 Interstitial pulmonary disease, unspecified: Secondary | ICD-10-CM | POA: Diagnosis not present

## 2021-09-27 DIAGNOSIS — G4733 Obstructive sleep apnea (adult) (pediatric): Secondary | ICD-10-CM | POA: Diagnosis not present

## 2021-09-27 NOTE — Telephone Encounter (Signed)
Called patient but he did not answer. Left a message for him to call us back. Will keep encounter open until we hear back from him.

## 2021-09-27 NOTE — Telephone Encounter (Signed)
Called and spoke with patient. He verbalized understanding and is ok with starting with the oxygen. He did state that he is currently using a cpap machine at night. He wants to know if he needs to have the O2 bled into his cpap or just use the cpap at night and use the O2 with exertion.   Patient is currently established with [facility].

## 2021-09-29 DIAGNOSIS — J849 Interstitial pulmonary disease, unspecified: Secondary | ICD-10-CM | POA: Diagnosis not present

## 2021-09-29 NOTE — Telephone Encounter (Signed)
Refaxed provider portion to [facility] for Esbriet PAP.  Fax: [phone] Phone: [phone]  Knox Saliva, PharmD, MPH, BCPS Clinical Pharmacist (Rheumatology and Pulmonology)

## 2021-09-30 DIAGNOSIS — K2971 Gastritis, unspecified, with bleeding: Secondary | ICD-10-CM | POA: Diagnosis not present

## 2021-09-30 DIAGNOSIS — R195 Other fecal abnormalities: Secondary | ICD-10-CM | POA: Diagnosis not present

## 2021-09-30 DIAGNOSIS — D500 Iron deficiency anemia secondary to blood loss (chronic): Secondary | ICD-10-CM | POA: Diagnosis not present

## 2021-09-30 DIAGNOSIS — K219 Gastro-esophageal reflux disease without esophagitis: Secondary | ICD-10-CM | POA: Diagnosis not present

## 2021-09-30 DIAGNOSIS — Z7901 Long term (current) use of anticoagulants: Secondary | ICD-10-CM | POA: Diagnosis not present

## 2021-09-30 NOTE — Telephone Encounter (Signed)
LMTCB   Per PM patient to use oxygen with exertion and at night.

## 2021-09-30 NOTE — Telephone Encounter (Signed)
I have called the pt and he is aware of order for the oxygen that has been sent to [employer] to get him set up with oxygen for home use at night and with exertion.  Pt is aware.

## 2021-09-30 NOTE — Telephone Encounter (Signed)
Patient is returning phone call. Patient phone number is [phone].

## 2021-10-01 DIAGNOSIS — J849 Interstitial pulmonary disease, unspecified: Secondary | ICD-10-CM | POA: Diagnosis not present

## 2021-10-02 DIAGNOSIS — G4733 Obstructive sleep apnea (adult) (pediatric): Secondary | ICD-10-CM | POA: Diagnosis not present

## 2021-10-04 DIAGNOSIS — J849 Interstitial pulmonary disease, unspecified: Secondary | ICD-10-CM | POA: Diagnosis not present

## 2021-10-06 ENCOUNTER — Other Ambulatory Visit (HOSPITAL_COMMUNITY): Payer: Self-pay

## 2021-10-06 DIAGNOSIS — J849 Interstitial pulmonary disease, unspecified: Secondary | ICD-10-CM | POA: Diagnosis not present

## 2021-10-06 NOTE — Telephone Encounter (Signed)
Patient has [place] test claim for Pirfenidone. Patient's copay for 1 month supply is $10.00. No restrictions on test claim, but patient may have to fill through [facility] home delivery in the future.

## 2021-10-08 ENCOUNTER — Telehealth: Payer: Self-pay | Admitting: Pulmonary Disease

## 2021-10-08 DIAGNOSIS — J849 Interstitial pulmonary disease, unspecified: Secondary | ICD-10-CM | POA: Diagnosis not present

## 2021-10-11 ENCOUNTER — Telehealth: Payer: Self-pay | Admitting: Pharmacist

## 2021-10-11 DIAGNOSIS — J849 Interstitial pulmonary disease, unspecified: Secondary | ICD-10-CM

## 2021-10-11 DIAGNOSIS — Z5181 Encounter for therapeutic drug level monitoring: Secondary | ICD-10-CM

## 2021-10-11 MED ORDER — PIRFENIDONE 267 MG PO TABS
ORAL_TABLET | ORAL | 0 refills | Status: DC
Start: 2021-10-11 — End: 2022-02-11

## 2021-10-11 MED ORDER — PIRFENIDONE 267 MG PO TABS: 801.0000 mg | ORAL_TABLET | Freq: Three times a day (TID) | ORAL | 1 refills | Status: DC

## 2021-10-11 NOTE — Telephone Encounter (Signed)
F/u in [place] encounter. Will close this encounter  Knox Saliva, PharmD, MPH, BCPS Clinical Pharmacist (Rheumatology and Pulmonology)

## 2021-10-11 NOTE — Telephone Encounter (Signed)
Patient counseled on Esbriet (generic pirfenidone) in separate telephone encounter. Rx sent to [facility] which he uses for his apixaban as well.   Knox Saliva, PharmD, MPH, BCPS Clinical Pharmacist (Rheumatology and Pulmonology)

## 2021-10-11 NOTE — Telephone Encounter (Signed)
Subjective:  Patient called today by [facility] Pulmonary pharmacy team for Esbriet new start counseling.   Patient was last seen by Dr. Vaughan Browner on 09/03/21.  Pertinent past medical history includes ILD, CAD, paroxysmal atrial fibrillation, HTN, dyslipidemia.   History of elevated LFTs: No History of diarrhea, nausea, vomiting: No  Objective: Allergies  Allergen Reactions   Sulfamethoxazole Other (See Comments)    Severe reaction   Amlodipine     Tingling hands   Ceftriaxone     Other reaction(s): Unknown   Ciprofloxacin Itching    Edema of ear   Cortisone Acetate [Cortisone] Hives    Hive reaction to injectable Kenalog and topical reaction to Cortisporin   Elemental Sulfur    Flomax [Tamsulosin] Other (See Comments)    Dizziness, blurred vision and Headache   Hibiclens [Chlorhexidine Gluconate]    Lisinopril    Other     Steroid Injections   Penicillins    Probiata [Lactobacillus]    Rocephin [Ceftriaxone Sodium In Dextrose]    Sulfa Antibiotics     Other reaction(s): Unknown    Outpatient Encounter Medications as of 10/11/2021  Medication Sig   Pirfenidone (ESBRIET) 267 MG TABS Take 1 tab three times daily for 7 days, then 2 tabs three times daily for 7 days, then 3 tabs three times daily thereafter.   alfuzosin (UROXATRAL) 10 MG 24 hr tablet Take 10 mg by mouth daily with breakfast.   amiodarone (PACERONE) 200 MG tablet Take 1 tablet (200 mg total) by mouth daily.   apixaban (ELIQUIS) 5 MG TABS tablet Take 1 tablet (5 mg total) by mouth 2 (two) times daily.   bethanechol (URECHOLINE) 25 MG tablet Take 25 mg by mouth 2 (two) times daily.   carvedilol (COREG) 12.5 MG tablet Take 12.5 mg by mouth 2 (two) times daily. (Patient not taking: Reported on 09/03/2021)   Cholecalciferol (D3 2000) 50 MCG (2000 UT) CAPS Take 2,000 Units by mouth daily at 12 noon.   Coenzyme Q10 (COQ10) 100 MG CAPS Take 100 mg by mouth daily.   fluticasone (FLONASE) 50 MCG/ACT nasal spray Place 1 spray  into both nostrils 2 (two) times daily.   Multiple Vitamins-Minerals (VITAMIN D3 COMPLETE PO) Take 1 tablet by mouth daily.   pantoprazole (PROTONIX) 40 MG tablet Take 40 mg by mouth 2 (two) times daily.   Probiotic Product (PROBIOTIC DAILY PO) Take 1 tablet by mouth daily.   simvastatin (ZOCOR) 20 MG tablet Take 20 mg by mouth every evening.   sucralfate (CARAFATE) 1 g tablet Take 1 g by mouth 2 (two) times daily.   telmisartan (MICARDIS) 20 MG tablet Take 10 mg by mouth daily.   valACYclovir (VALTREX) 500 MG tablet Take 500 mg by mouth 2 (two) times daily as needed for other (Fever blisters). Fever blisters (Patient not taking: Reported on 09/03/2021)   No facility-administered encounter medications on file as of 10/11/2021.     Immunization History  Administered Date(s) Administered   Influenza Split 09/25/2012   Moderna Sars-Covid-2 Vaccination 03/31/2020, 04/28/2020, 11/06/2020    PFT's TLC  Date Value Ref Range Status  09/03/2021 5.21 L Final    CMP     Component Value Date/Time   NA 145 (H) 06/08/2021 1417   K 4.7 06/08/2021 1417   CL 110 (H) 06/08/2021 1417   CO2 18 (L) 06/08/2021 1417   GLUCOSE 122 (H) 06/08/2021 1417   BUN 21 06/08/2021 1417   CREATININE 1.20 06/08/2021 1417   CALCIUM 9.1 06/08/2021  1417   PROT 7.1 06/08/2021 1417   ALBUMIN 4.2 06/08/2021 1417   AST 15 06/08/2021 1417   ALT 10 06/08/2021 1417   ALKPHOS 52 06/08/2021 1417   BILITOT 0.2 06/08/2021 1417    CBC No results found for: WBC, RBC, HGB, HCT, PLT, MCV, MCH, MCHC, RDW, LYMPHSABS, MONOABS, EOSABS, BASOSABS   LFT's Hepatic Function Latest Ref Rng & Units 06/08/2021 04/05/2021  Total Protein 6.0 - 8.5 g/dL 7.1 6.8  Albumin 3.7 - 4.7 g/dL 4.2 3.7  AST 0 - 40 IU/L 15 15  ALT 0 - 44 IU/L 10 11  Alk Phosphatase 44 - 121 IU/L 52 56  Total Bilirubin 0.0 - 1.2 mg/dL 0.2 0.4  Bilirubin, Direct 0.00 - 0.40 mg/dL 0.11 0.14    High-res CT 03/28/2021-pulmonary fibrosis in UIP pattern.  Honeycombing  present, bronchial wall thickening, emphysema, coronary artery disease, aortic aneurysm, enlargement of pulmonary artery  CT chest 05/03/2021-redemonstrated pulmonary fibrosis  Assessment and Plan  Esbriet Medication Management Thoroughly counseled patient on the efficacy, mechanism of action, dosing, administration, adverse effects, and monitoring parameters of Esbriet.  Patient verbalized understanding. Patient education handout provided.   Goals of Therapy: Will not stop or reverse the progression of ILD. It will slow the progression of ILD.   Dosing: Starting dose will be Esbriet 267 mg 1 tablet three times daily for 7 days, then 2 tablets three times daily for 7 days, then 3 tablets three times daily.  Maintenance dose will be 801 mg 1 tablet three times daily if tolerated.  Stressed the importance of taking with meals and space at least 5-6 hours apart to minimize stomach upset.   Adverse Effects: Nausea, vomiting, diarrhea, weight loss Abdominal pain GERD Sun sensitivity/rash - patient advised to wear sunscreen when exposed to sunlight Dizziness Fatigue  Monitoring: Monitor for diarrhea, nausea and vomiting, GI perforation, hepatotoxicity  Monitor LFTs - baseline, monthly for first 6 months, then every 3 months routinely CBC w differential at baseline and every 3 months routinely  Access: Approval of Esbriet through: insurance Rx sent to: [facility] (patient advised of $10 copay)  Medication Reconciliation A drug regimen assessment was performed, including review of allergies, interactions, disease-state management, dosing and immunization history. Medications were reviewed with the patient, including name, instructions, indication, goals of therapy, potential side effects, importance of adherence, and safe use.  Immunizations Patient has received 3 COVID19 vaccines.  This appointment required 30 minutes of patient care (this includes precharting, chart review, review of  results, face-to-face care, etc.).  Thank you for involving pharmacy to assist in providing this patient's care.    Knox Saliva, PharmD, MPH, BCPS Clinical Pharmacist (Rheumatology and Pulmonology)

## 2021-10-13 DIAGNOSIS — J849 Interstitial pulmonary disease, unspecified: Secondary | ICD-10-CM | POA: Diagnosis not present

## 2021-10-15 DIAGNOSIS — J849 Interstitial pulmonary disease, unspecified: Secondary | ICD-10-CM | POA: Diagnosis not present

## 2021-10-18 DIAGNOSIS — J849 Interstitial pulmonary disease, unspecified: Secondary | ICD-10-CM | POA: Diagnosis not present

## 2021-10-20 DIAGNOSIS — J849 Interstitial pulmonary disease, unspecified: Secondary | ICD-10-CM | POA: Diagnosis not present

## 2021-10-22 DIAGNOSIS — J849 Interstitial pulmonary disease, unspecified: Secondary | ICD-10-CM | POA: Diagnosis not present

## 2021-10-25 DIAGNOSIS — J849 Interstitial pulmonary disease, unspecified: Secondary | ICD-10-CM | POA: Diagnosis not present

## 2021-10-26 NOTE — Telephone Encounter (Signed)
Received a fax from  [place] regarding an approval for [place] patient assistance from 10/25/21 until no end date.   Phone number: [phone]  Patient receives for $10 copay through [facility]. Called patient to advise to disregard this approval and that he does not qualify for patient assistance since his copay is affordable. Advised he will continue to fill through [facility].  He started pirfenidone 1 week and 3 days ago.  Knox Saliva, PharmD, MPH, BCPS Clinical Pharmacist (Rheumatology and Pulmonology)

## 2021-10-27 DIAGNOSIS — E538 Deficiency of other specified B group vitamins: Secondary | ICD-10-CM | POA: Diagnosis not present

## 2021-10-27 DIAGNOSIS — J849 Interstitial pulmonary disease, unspecified: Secondary | ICD-10-CM | POA: Diagnosis not present

## 2021-10-29 DIAGNOSIS — J849 Interstitial pulmonary disease, unspecified: Secondary | ICD-10-CM | POA: Diagnosis not present

## 2021-11-01 DIAGNOSIS — J849 Interstitial pulmonary disease, unspecified: Secondary | ICD-10-CM | POA: Diagnosis not present

## 2021-11-02 DIAGNOSIS — G4733 Obstructive sleep apnea (adult) (pediatric): Secondary | ICD-10-CM | POA: Diagnosis not present

## 2021-11-03 ENCOUNTER — Ambulatory Visit (INDEPENDENT_AMBULATORY_CARE_PROVIDER_SITE_OTHER): Payer: Medicare PPO | Admitting: Pulmonary Disease

## 2021-11-03 ENCOUNTER — Other Ambulatory Visit: Payer: Self-pay

## 2021-11-03 ENCOUNTER — Encounter: Payer: Self-pay | Admitting: Pulmonary Disease

## 2021-11-03 VITALS — BP 136/60 | HR 80 | Temp 98.1°F | Ht 70.0 in | Wt 195.6 lb

## 2021-11-03 DIAGNOSIS — Z5181 Encounter for therapeutic drug level monitoring: Secondary | ICD-10-CM

## 2021-11-03 DIAGNOSIS — J849 Interstitial pulmonary disease, unspecified: Secondary | ICD-10-CM | POA: Diagnosis not present

## 2021-11-03 LAB — COMPREHENSIVE METABOLIC PANEL
ALT: 10 U/L (ref 0–53)
AST: 14 U/L (ref 0–37)
Albumin: 3.9 g/dL (ref 3.5–5.2)
Alkaline Phosphatase: 45 U/L (ref 39–117)
BUN: 19 mg/dL (ref 6–23)
CO2: 27 mEq/L (ref 19–32)
Calcium: 9 mg/dL (ref 8.4–10.5)
Chloride: 105 mEq/L (ref 96–112)
Creatinine, Ser: 1.39 mg/dL (ref 0.40–1.50)
GFR: 47.84 mL/min — ABNORMAL LOW (ref >60.00)
Glucose, Bld: 135 mg/dL — ABNORMAL HIGH (ref 70–99)
Potassium: 3.7 mEq/L (ref 3.5–5.1)
Sodium: 137 mEq/L (ref 135–145)
Total Bilirubin: 0.4 mg/dL (ref 0.2–1.2)
Total Protein: 6.8 g/dL (ref 6.0–8.3)

## 2021-11-03 NOTE — Patient Instructions (Signed)
I am glad you are tolerating the Esbriet well We will check a hepatic function panel today You need monthly labs that he can come here to get drawn or get them done at your primary care Dr. Venetia Maxon.  Please have labs from the faxed to [place]  Follow-up in 3 months

## 2021-11-03 NOTE — Progress Notes (Addendum)
Vincent Wiley    [id number]    10-Sep-1941  Primary Care Physician:Street, Vincent Mt, MD  Referring Physician: Venetia Wiley, Vincent Mt, MD [place],  [place] [address]  Chief complaint: Follow-up for pulmonary fibrosis  HPI: 80 year old with history of coronary artery disease, paroxysmal atrial fibrillation, hypertension, dyslipidemia. Follows with Dr. Geraldo Wiley, cardiology.  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  No feather pillows or comforters Smoking history: 26-pack-year smoker.  Quit in 1984 Travel history: No significant travel history Relevant family history: No family history of lung disease  Interim history: Started on Esbriet in October 2022.  He initially had some mood changes and discomfort with the medication but is now tolerating full dose well No new issues Continues with pulmonary rehab at [facility] Encounter Medications as of 11/03/2021  Medication Sig   alfuzosin (UROXATRAL) 10 MG 24 hr tablet Take 10 mg by mouth daily with breakfast.   amiodarone (PACERONE) 200 MG tablet Take 1 tablet (200 mg total) by mouth daily.   apixaban (ELIQUIS) 5 MG TABS tablet Take 1 tablet (5 mg total) by mouth 2 (two) times daily.   bethanechol (URECHOLINE) 25 MG tablet Take 25 mg by mouth 2 (two) times daily.   carvedilol (COREG) 12.5 MG tablet Take 12.5 mg by mouth 2 (two) times daily.   Cholecalciferol (D3 2000) 50 MCG (2000 UT) CAPS Take 2,000 Units by mouth daily at 12 noon.   Coenzyme Q10 (COQ10) 100 MG CAPS Take 100 mg by mouth daily.   fluticasone (FLONASE) 50 MCG/ACT nasal spray Place 1 spray into both  nostrils 2 (two) times daily.   Multiple Vitamins-Minerals (VITAMIN D3 COMPLETE PO) Take 1 tablet by mouth daily.   pantoprazole (PROTONIX) 40 MG tablet Take 40 mg by mouth 2 (two) times daily.   Pirfenidone (ESBRIET) 267 MG TABS Take 1 tab three times daily for 7 days, then 2 tabs three times daily for 7 days, then 3 tabs three times daily thereafter.   Pirfenidone (ESBRIET) 267 MG TABS Take 3 tablets (801 mg total) by mouth 3 (three) times daily with meals. Maintenance dose (Month 2 and onward)   Probiotic Product (PROBIOTIC DAILY PO) Take 1 tablet by mouth daily.   simvastatin (ZOCOR) 20 MG tablet Take 20 mg by mouth every evening.   sucralfate (CARAFATE) 1 g tablet Take 1 g by mouth 2 (two) times daily.   telmisartan (MICARDIS) 20 MG tablet Take 10 mg by mouth daily.   valACYclovir (VALTREX) 500 MG tablet Take 500 mg by mouth 2 (two) times daily as needed for other (Fever blisters). Fever blisters   No facility-administered encounter medications on file as of 11/03/2021.    Physical Exam: Blood pressure 136/60, pulse 80, temperature 98.1 F (36.7 C), temperature source Oral, height 5\' 10"  (1.778 m), weight 195 lb 9.6 oz (88.7 kg). Gen:      No acute distress HEENT:  EOMI, sclera anicteric Neck:     No masses; no thyromegaly Lungs:    Bibasal crackles CV:         Regular rate and rhythm; no murmurs Abd:      +  bowel sounds; soft, non-tender; no palpable masses, no distension Ext:    No edema; adequate peripheral perfusion Skin:      Warm and dry; no rash Neuro: alert and oriented x 3 Psych: normal mood and affect   Data Reviewed: Imaging: High-res CT 03/28/2021-pulmonary fibrosis in UIP pattern.  Honeycombing present, bronchial wall thickening, emphysema, coronary artery disease, aortic aneurysm, enlargement of pulmonary artery  CT chest 05/03/2021-redemonstrated pulmonary fibrosis I have reviewed the images personally.  Cardiac: Echocardiogram 04/03/2019- LVEF 60 to 65%, mild  concentric LVH, grade 3 diastolic dysfunction, mildly elevated PA systolic pressure, ASD  PFTs: 09/03/2021 FVC 2.70 [102%], FEV1 3.22 [126%], F/F 87, TLC 5.21 [79%], DLCO 13.51 [59%] Severe diffusion defect  Labs: CTD serologies 09/03/2021-negative  Assessment:  Pulmonary fibrosis, IPF I have reviewed prior CT scans and typical UIP pattern with honeycombing.  This is likely to be idiopathic pulmonary fibrosis in the absence of any exposures or signs and symptoms of connective tissue disease.  CTD serologies are negative  Discussed findings and diagnosis with patient No need for biopsy as he diagnosis of IPF is fairly certain We discussed antifibrotic's and have decided to initiate Esbriet LFTs earlier this year are negative.  He is tolerating it reasonably well so far Check LFTs today and monthly  Continue pulmonary rehab  Pulmonary hypertension He has mild elevation in PA systolic pressure on echocardiogram This is likely secondary to left heart disease with diastolic dysfunction and OSA May need right heart cath later  Obstructive sleep apnea Compliant with CPAP.  Follows with Dr. Maxwell Wiley  Plan/Recommendations: Vincent Wiley, monitor labs Pulmonary rehab  Vincent Garfinkel MD [facility] 11/03/2021, 1:51 PM  CC: Street, Vincent Wiley, *  Addendum: Hepatic panel from primary care dated 12/01/2021-within normal limits Hepatic panel from primary care dated 01/03/2022-within normal limits  Vincent Garfinkel MD [facility] 01/05/2022, 3:47 PM

## 2021-11-05 DIAGNOSIS — J849 Interstitial pulmonary disease, unspecified: Secondary | ICD-10-CM | POA: Diagnosis not present

## 2021-11-08 DIAGNOSIS — J849 Interstitial pulmonary disease, unspecified: Secondary | ICD-10-CM | POA: Diagnosis not present

## 2021-11-10 DIAGNOSIS — J849 Interstitial pulmonary disease, unspecified: Secondary | ICD-10-CM | POA: Diagnosis not present

## 2021-11-12 DIAGNOSIS — J849 Interstitial pulmonary disease, unspecified: Secondary | ICD-10-CM | POA: Diagnosis not present

## 2021-11-15 DIAGNOSIS — J849 Interstitial pulmonary disease, unspecified: Secondary | ICD-10-CM | POA: Diagnosis not present

## 2021-11-16 DIAGNOSIS — J849 Interstitial pulmonary disease, unspecified: Secondary | ICD-10-CM | POA: Diagnosis not present

## 2021-11-17 DIAGNOSIS — J849 Interstitial pulmonary disease, unspecified: Secondary | ICD-10-CM | POA: Diagnosis not present

## 2021-11-21 DIAGNOSIS — J111 Influenza due to unidentified influenza virus with other respiratory manifestations: Secondary | ICD-10-CM | POA: Diagnosis not present

## 2021-11-24 DIAGNOSIS — J849 Interstitial pulmonary disease, unspecified: Secondary | ICD-10-CM | POA: Diagnosis not present

## 2021-11-25 DIAGNOSIS — E538 Deficiency of other specified B group vitamins: Secondary | ICD-10-CM | POA: Diagnosis not present

## 2021-11-26 DIAGNOSIS — J849 Interstitial pulmonary disease, unspecified: Secondary | ICD-10-CM | POA: Diagnosis not present

## 2021-11-29 ENCOUNTER — Telehealth: Payer: Self-pay | Admitting: Pulmonary Disease

## 2021-11-29 DIAGNOSIS — J849 Interstitial pulmonary disease, unspecified: Secondary | ICD-10-CM | POA: Diagnosis not present

## 2021-11-29 DIAGNOSIS — Z5181 Encounter for therapeutic drug level monitoring: Secondary | ICD-10-CM

## 2021-11-29 NOTE — Telephone Encounter (Signed)
PM please advise.thanks  

## 2021-11-30 NOTE — Telephone Encounter (Signed)
Spoke with the pt and notified of response per Dr Vaughan Browner  He gave me number to fax lab order to and I have faxed this to [facility] at [phone]

## 2021-11-30 NOTE — Telephone Encounter (Signed)
OK to send the order for LFTs to [facility]. The will need to fax the results to [place]

## 2021-12-01 DIAGNOSIS — J849 Interstitial pulmonary disease, unspecified: Secondary | ICD-10-CM | POA: Diagnosis not present

## 2021-12-01 DIAGNOSIS — Z5181 Encounter for therapeutic drug level monitoring: Secondary | ICD-10-CM | POA: Diagnosis not present

## 2021-12-03 DIAGNOSIS — J849 Interstitial pulmonary disease, unspecified: Secondary | ICD-10-CM | POA: Diagnosis not present

## 2021-12-06 ENCOUNTER — Telehealth: Payer: Self-pay | Admitting: Pulmonary Disease

## 2021-12-06 DIAGNOSIS — J849 Interstitial pulmonary disease, unspecified: Secondary | ICD-10-CM | POA: Diagnosis not present

## 2021-12-06 NOTE — Telephone Encounter (Signed)
Called patient but he did not answer. Left message for him to call back.  

## 2021-12-07 NOTE — Telephone Encounter (Signed)
Called and spoke to pt. Pt states he is taking Esbriet 267mg , taking 3 tabs TID. Pt c/o decreased appetite, decreased energy level, intermittent mild dizziness, and change in taste x 2-3 weeks. Pt states he also had the flu 3 weeks ago. Pt denies any GI symptoms. Pt is requesting recs.   Dr. Vaughan Browner, please advise. Thanks!

## 2021-12-08 DIAGNOSIS — J849 Interstitial pulmonary disease, unspecified: Secondary | ICD-10-CM | POA: Diagnosis not present

## 2021-12-08 DIAGNOSIS — R300 Dysuria: Secondary | ICD-10-CM | POA: Diagnosis not present

## 2021-12-08 DIAGNOSIS — M549 Dorsalgia, unspecified: Secondary | ICD-10-CM | POA: Diagnosis not present

## 2021-12-09 ENCOUNTER — Telehealth: Payer: Self-pay | Admitting: Pulmonary Disease

## 2021-12-09 NOTE — Telephone Encounter (Signed)
disregard

## 2021-12-09 NOTE — Telephone Encounter (Signed)
Pt is calling back as they have not received a response back from [place]. Routing back to Dr. Vaughan Browner. Please advise.

## 2021-12-09 NOTE — Telephone Encounter (Signed)
Please let him know that he may be having increased side effects from the [place] as he is recovering from the flu.  I would advise him to stop the Esbriet for 2 weeks to give a break and then resume  Please call us back if symptoms recur

## 2021-12-10 NOTE — Telephone Encounter (Signed)
Patient is aware of below message/recommendations and voiced his understanding.  Nothing further needed at this time.

## 2021-12-23 ENCOUNTER — Ambulatory Visit: Payer: Medicare PPO | Admitting: Cardiology

## 2021-12-28 ENCOUNTER — Telehealth: Payer: Self-pay | Admitting: Pulmonary Disease

## 2021-12-28 DIAGNOSIS — E538 Deficiency of other specified B group vitamins: Secondary | ICD-10-CM | POA: Diagnosis not present

## 2021-12-28 NOTE — Telephone Encounter (Signed)
Please provide pt with information to health information management to obtain all of his records. Thanks.

## 2021-12-31 ENCOUNTER — Telehealth: Payer: Self-pay | Admitting: Pulmonary Disease

## 2022-01-03 DIAGNOSIS — Z5181 Encounter for therapeutic drug level monitoring: Secondary | ICD-10-CM | POA: Diagnosis not present

## 2022-01-03 NOTE — Telephone Encounter (Signed)
He will need monthly hepatic panel for the next 4 months

## 2022-01-03 NOTE — Telephone Encounter (Signed)
Call made to patient, confirmed DOB. Patient is requesting order for lab work to be sent to [facility] on monthly basis for lab work.   PM please advise does he just need the Hepatic panel monthly? If so for how many months? Monthly for the next 6 months, 12 months, etc. Thanks

## 2022-01-03 NOTE — Telephone Encounter (Signed)
I have faxed request on our fax sheet to [facility] for labs  [facility] for the pt to let him know

## 2022-01-04 NOTE — Telephone Encounter (Signed)
Call made to patient, made aware lab orders have been faxed. Voiced understanding.   Nothing further needed at this time.

## 2022-01-06 ENCOUNTER — Other Ambulatory Visit: Payer: Self-pay

## 2022-01-06 NOTE — Telephone Encounter (Addendum)
I contacted the patient and informed we will need a medical release form filled out in order to release his records to the [place]. Patient states mailing a form would be the best option. Medical release placed in the mail- [facility] did not have an authorization to release records either.

## 2022-01-11 DIAGNOSIS — R339 Retention of urine, unspecified: Secondary | ICD-10-CM | POA: Diagnosis not present

## 2022-01-11 DIAGNOSIS — R972 Elevated prostate specific antigen [PSA]: Secondary | ICD-10-CM | POA: Diagnosis not present

## 2022-01-11 DIAGNOSIS — N401 Enlarged prostate with lower urinary tract symptoms: Secondary | ICD-10-CM | POA: Diagnosis not present

## 2022-01-12 ENCOUNTER — Encounter: Payer: Self-pay | Admitting: Cardiology

## 2022-01-12 ENCOUNTER — Ambulatory Visit (INDEPENDENT_AMBULATORY_CARE_PROVIDER_SITE_OTHER): Payer: Medicare PPO | Admitting: Cardiology

## 2022-01-12 ENCOUNTER — Other Ambulatory Visit: Payer: Self-pay

## 2022-01-12 VITALS — BP 132/52 | HR 88 | Ht 69.0 in | Wt 188.0 lb

## 2022-01-12 DIAGNOSIS — I480 Paroxysmal atrial fibrillation: Secondary | ICD-10-CM | POA: Diagnosis not present

## 2022-01-12 DIAGNOSIS — I7121 Aneurysm of the ascending aorta, without rupture: Secondary | ICD-10-CM

## 2022-01-12 DIAGNOSIS — N400 Enlarged prostate without lower urinary tract symptoms: Secondary | ICD-10-CM

## 2022-01-12 DIAGNOSIS — G4730 Sleep apnea, unspecified: Secondary | ICD-10-CM | POA: Diagnosis not present

## 2022-01-12 DIAGNOSIS — I2510 Atherosclerotic heart disease of native coronary artery without angina pectoris: Secondary | ICD-10-CM | POA: Diagnosis not present

## 2022-01-12 DIAGNOSIS — I10 Essential (primary) hypertension: Secondary | ICD-10-CM | POA: Diagnosis not present

## 2022-01-12 DIAGNOSIS — J309 Allergic rhinitis, unspecified: Secondary | ICD-10-CM | POA: Insufficient documentation

## 2022-01-12 DIAGNOSIS — J84112 Idiopathic pulmonary fibrosis: Secondary | ICD-10-CM | POA: Insufficient documentation

## 2022-01-12 DIAGNOSIS — K219 Gastro-esophageal reflux disease without esophagitis: Secondary | ICD-10-CM

## 2022-01-12 HISTORY — DX: Essential (primary) hypertension: I10

## 2022-01-12 HISTORY — DX: Benign prostatic hyperplasia without lower urinary tract symptoms: N40.0

## 2022-01-12 HISTORY — DX: Gastro-esophageal reflux disease without esophagitis: K21.9

## 2022-01-12 HISTORY — DX: Allergic rhinitis, unspecified: J30.9

## 2022-01-12 HISTORY — DX: Idiopathic pulmonary fibrosis: J84.112

## 2022-01-12 MED ORDER — APIXABAN 5 MG PO TABS: 5.0000 mg | ORAL_TABLET | Freq: Two times a day (BID) | ORAL | 3 refills | Status: DC

## 2022-01-12 MED ORDER — AMIODARONE HCL 200 MG PO TABS: 200.0000 mg | ORAL_TABLET | Freq: Every day | ORAL | 3 refills | Status: DC

## 2022-01-12 NOTE — Progress Notes (Signed)
Cardiology Office Note:    Date:  01/12/2022   ID:  Vincent Wiley, DOB 1941-08-02, MRN [id number]  PCP:  Street, Sharon Mt, MD  Cardiologist:  Jenean Lindau, MD   Referring MD: [address] Sharon Mt, *    ASSESSMENT:    1. Ascending aortic aneurysm, unspecified whether ruptured   2. Essential (primary) hypertension   3. Atherosclerosis of native coronary artery of native heart without angina pectoris   4. Paroxysmal atrial fibrillation (HCC)   5. Benign essential hypertension   6. Sleep apnea, unspecified type    PLAN:    In order of problems listed above:  Coronary atherosclerosis: Secondary prevention stressed with the patient.  Importance of compliance with diet medication stressed any vocalized understanding. Paroxysmal atrial fibrillation: On amiodarone therapy.  This is managed by his pulmonologist.  I have cut down his amiodarone today because of pulmonary fibrosis history.  I have reduced it to 100 mg daily.  Again the pulmonary issues will be monitored by our pulmonology colleagues and we await recommendations if there needs to be any changes.I discussed with the patient atrial fibrillation, disease process. Management and therapy including rate and rhythm control, anticoagulation benefits and potential risks were discussed extensively with the patient. Patient had multiple questions which were answered to patient's satisfaction.  Benefits and potential risks of amiodarone explained to him and he vocalized understanding and questions were answered to satisfaction. Essential hypertension: Blood pressure stable and diet was emphasized.  Lifestyle modification urged. Mixed dyslipidemia: Lipids were reviewed.  She is on statin therapy and again followed by primary care. Patient will be seen in follow-up appointment in 6 months or earlier if the patient has any concerns    Medication Adjustments/Labs and Tests Ordered: Current medicines are reviewed at length with the  patient today.  Concerns regarding medicines are outlined above.  No orders of the defined types were placed in this encounter.  No orders of the defined types were placed in this encounter.    No chief complaint on file.    History of Present Illness:    Vincent Wiley is a 80 y.o. male.  Patient has past medical history of paroxysmal atrial fibrillation, essential hypertension dyslipidemia and interstitial lung disease.  He denies any problems at this time and takes care of activities of daily living.  No chest pain orthopnea or PND.  He has his usual shortness of breath on exertion and sees a pulmonologist on a regular basis.  His PFTs were abnormal in the past.  Past Medical History:  Diagnosis Date   Acid reflux    Anemia due to multiple mechanisms    Chronic GI losses, B12 Deficiency   Ascending aortic aneurysm 06/08/2021   Benign essential hypertension    BPH with obstruction/lower urinary tract symptoms    Cardiac murmur 03/31/2021   Coronary atherosclerosis 06/08/2021   DOE (dyspnea on exertion)    Hyperlipidemia    Kidney disease    Obstructive sleep apnea syndrome 04/30/2013   NPSG 01/2012:  AHI 27/hr with desat to 86%.   Paroxysmal atrial fibrillation ([place]) 03/31/2021   Pernicious anemia    Pulmonary fibrosis ([place]) 03/31/2021   Seasonal allergies    Sleep apnea    Vitamin B12 deficiency     Past Surgical History:  Procedure Laterality Date   EYE SURGERY Bilateral    right eye realignment//cataract left eye   HERNIA REPAIR     umbilical   MULTIPLE TOOTH EXTRACTIONS     REPLACEMENT  TOTAL KNEE Bilateral     Current Medications: Current Meds  Medication Sig   alfuzosin (UROXATRAL) 10 MG 24 hr tablet Take 10 mg by mouth daily with breakfast.   amiodarone (PACERONE) 200 MG tablet Take 1 tablet (200 mg total) by mouth daily.   apixaban (ELIQUIS) 5 MG TABS tablet Take 1 tablet (5 mg total) by mouth 2 (two) times daily.   bethanechol (URECHOLINE) 25 MG tablet Take  25 mg by mouth 2 (two) times daily.   carvedilol (COREG) 3.125 MG tablet Take 3.125 mg by mouth 2 (two) times daily.   Cholecalciferol (D3 2000) 50 MCG (2000 UT) CAPS Take 2,000 Units by mouth daily at 12 noon.   Coenzyme Q10 (COQ10) 100 MG CAPS Take 100 mg by mouth daily.   fluticasone (FLONASE) 50 MCG/ACT nasal spray Place 1 spray into both nostrils 2 (two) times daily.   Multiple Vitamins-Minerals (VITAMIN D3 COMPLETE PO) Take 1 tablet by mouth daily.   pantoprazole (PROTONIX) 40 MG tablet Take 40 mg by mouth 2 (two) times daily.   Pirfenidone (ESBRIET) 267 MG TABS Take 1 tab three times daily for 7 days, then 2 tabs three times daily for 7 days, then 3 tabs three times daily thereafter.   Pirfenidone (ESBRIET) 267 MG TABS Take 3 tablets (801 mg total) by mouth 3 (three) times daily with meals. Maintenance dose (Month 2 and onward)   Probiotic Product (PROBIOTIC DAILY PO) Take 1 tablet by mouth daily.   simvastatin (ZOCOR) 20 MG tablet Take 20 mg by mouth every evening.   sucralfate (CARAFATE) 1 GM/10ML suspension Take 1 g by mouth 2 (two) times daily.   telmisartan (MICARDIS) 20 MG tablet Take 10 mg by mouth daily.   valACYclovir (VALTREX) 500 MG tablet Take 500 mg by mouth 2 (two) times daily as needed for other (Fever blisters). Fever blisters     Allergies:   Sulfamethoxazole, Amlodipine, Ceftriaxone, Ciprofloxacin, Cortisone acetate [cortisone], Elemental sulfur, Flomax [tamsulosin], Hibiclens [chlorhexidine gluconate], Lisinopril, Other, Penicillins, Probiata [lactobacillus], Rocephin [ceftriaxone sodium in dextrose], and Sulfa antibiotics   Social History   Socioeconomic History   Marital status: Unknown    Spouse name: Not on file   Number of children: Not on file   Years of education: Not on file   Highest education level: Not on file  Occupational History   Occupation: retired  Tobacco Use   Smoking status: Former    Packs/day: 1.50    Years: 25.00    Pack years: 37.50     Types: Cigarettes    Quit date: 08/27/1983    Years since quitting: 38.4   Smokeless tobacco: Never  Substance and Sexual Activity   Alcohol use: No   Drug use: No   Sexual activity: Not on file  Other Topics Concern   Not on file  Social History Narrative   Not on file   Social Determinants of Health   Financial Resource Strain: Not on file  Food Insecurity: Not on file  Transportation Needs: Not on file  Physical Activity: Not on file  Stress: Not on file  Social Connections: Not on file     Family History: The patient's family history includes AAA (abdominal aortic aneurysm) in his father; Aneurysm in his mother; Breast cancer in his sister and sister; Heart attack in his father.  ROS:   Please see the history of present illness.    All other systems reviewed and are negative.  EKGs/Labs/Other Studies Reviewed:    The  following studies were reviewed today: I discussed my findings with the patient at length.   Recent Labs: 06/08/2021: TSH 1.870 11/03/2021: ALT 10; BUN 19; Creatinine, Ser 1.39; Potassium 3.7; Sodium 137  Recent Lipid Panel No results found for: CHOL, TRIG, HDL, CHOLHDL, VLDL, LDLCALC, LDLDIRECT  Physical Exam:    VS:  BP (!) 132/52    Pulse 88    Ht 5\' 9"  (1.753 m)    Wt 188 lb (85.3 kg)    SpO2 94%    BMI 27.76 kg/m     Wt Readings from Last 3 Encounters:  01/12/22 188 lb (85.3 kg)  11/03/21 195 lb 9.6 oz (88.7 kg)  09/03/21 196 lb 11.2 oz (89.2 kg)     GEN: Patient is in no acute distress HEENT: Normal NECK: No JVD; No carotid bruits LYMPHATICS: No lymphadenopathy CARDIAC: Hear sounds regular, 2/6 systolic murmur at the apex. RESPIRATORY:  Clear to auscultation without rales, wheezing or rhonchi  ABDOMEN: Soft, non-tender, non-distended MUSCULOSKELETAL:  No edema; No deformity  SKIN: Warm and dry NEUROLOGIC:  Alert and oriented x 3 PSYCHIATRIC:  Normal affect   Signed, Jenean Lindau, MD  01/12/2022 3:07 PM    [place]

## 2022-01-12 NOTE — Patient Instructions (Signed)
Medication Instructions:  Your physician has recommended you make the following change in your medication:   Decrease your Amiodarone to 100 mg daily.  *If you need a refill on your cardiac medications before your next appointment, please call your pharmacy*   Lab Work: None ordered If you have labs (blood work) drawn today and your tests are completely normal, you will receive your results only by: [place] (if you have MyChart) OR A paper copy in the mail If you have any lab test that is abnormal or we need to change your treatment, we will call you to review the results.   Testing/Procedures: None ordered   Follow-Up: At [facility], you and your health needs are our priority.  As part of our continuing mission to provide you with exceptional heart care, we have created designated Provider Care Teams.  These Care Teams include your primary Cardiologist (physician) and Advanced Practice Providers (APPs -  Physician Assistants and Nurse Practitioners) who all work together to provide you with the care you need, when you need it.  We recommend signing up for the patient portal called "MyChart".  Sign up information is provided on this After Visit Summary.  MyChart is used to connect with patients for Virtual Visits (Telemedicine).  Patients are able to view lab/test results, encounter notes, upcoming appointments, etc.  Non-urgent messages can be sent to your provider as well.   To learn more about what you can do with MyChart, go to [email or web address].    Your next appointment:   3 month(s)  The format for your next appointment:   In Person  Provider:   Jyl Heinz, MD   Other Instructions NA

## 2022-01-17 ENCOUNTER — Telehealth: Payer: Self-pay | Admitting: Pulmonary Disease

## 2022-01-17 NOTE — Telephone Encounter (Signed)
Did quick chart review  1) new esbriet start - weight loss can be caused by esbriet and I ahv eseen this amount of weight loss but not this fast in just 2 months or so. Regardless - stop esbriet  2) options - can go to ofev  - which also causes weight loss and has GI side effect of diarrhea. Similar efficacy. In my experience weight loss might not be as much  3) option #3 - enter into clinical  HE can talk about these options with Dr Mannam2/10/23 visit

## 2022-01-17 NOTE — Telephone Encounter (Signed)
Called and spoke with pt letting him know the info stated by Dr. Chase Caller and stated that MR has stated for pt to stop the Esbriet. Pt verbalized understanding. Stated that he could further discuss options with Dr. Vaughan Browner at upcoming appt. Nothing further needed.

## 2022-01-17 NOTE — Telephone Encounter (Signed)
I called the patient and he reports that he is feeling that he has to make himself eat and he has lost 20-25 pounds in 2 months. He does not want to go on the other medication for the ILD. He wants to know if you want to keep him on this long term even with the weight loss. He is willing to keep on the medication and keep taking food but he reports that his appetite is not as good as before the medication. Do you have any other recommendations? Please advise.   This is a Dr. Vaughan Browner patient and they have ILD. Please advise on note.

## 2022-01-28 DIAGNOSIS — D519 Vitamin B12 deficiency anemia, unspecified: Secondary | ICD-10-CM | POA: Diagnosis not present

## 2022-02-03 DIAGNOSIS — Z5181 Encounter for therapeutic drug level monitoring: Secondary | ICD-10-CM | POA: Diagnosis not present

## 2022-02-03 DIAGNOSIS — J069 Acute upper respiratory infection, unspecified: Secondary | ICD-10-CM | POA: Diagnosis not present

## 2022-02-03 DIAGNOSIS — R070 Pain in throat: Secondary | ICD-10-CM | POA: Diagnosis not present

## 2022-02-04 ENCOUNTER — Ambulatory Visit (INDEPENDENT_AMBULATORY_CARE_PROVIDER_SITE_OTHER): Payer: Medicare PPO

## 2022-02-04 ENCOUNTER — Ambulatory Visit (INDEPENDENT_AMBULATORY_CARE_PROVIDER_SITE_OTHER): Payer: Medicare PPO | Admitting: Pulmonary Disease

## 2022-02-04 ENCOUNTER — Encounter: Payer: Self-pay | Admitting: Pulmonary Disease

## 2022-02-04 ENCOUNTER — Other Ambulatory Visit: Payer: Self-pay

## 2022-02-04 VITALS — BP 126/62 | HR 79 | Temp 98.0°F | Ht 69.0 in | Wt 184.4 lb

## 2022-02-04 DIAGNOSIS — Z5181 Encounter for therapeutic drug level monitoring: Secondary | ICD-10-CM | POA: Diagnosis not present

## 2022-02-04 DIAGNOSIS — J849 Interstitial pulmonary disease, unspecified: Secondary | ICD-10-CM

## 2022-02-04 DIAGNOSIS — R0600 Dyspnea, unspecified: Secondary | ICD-10-CM | POA: Diagnosis not present

## 2022-02-04 MED ORDER — TRELEGY ELLIPTA 200-62.5-25 MCG/ACT IN AEPB: 1.0000 | INHALATION_SPRAY | Freq: Every day | RESPIRATORY_TRACT | 0 refills | Status: DC

## 2022-02-04 MED ORDER — TRELEGY ELLIPTA 200-62.5-25 MCG/ACT IN AEPB: 1.0000 | INHALATION_SPRAY | Freq: Every day | RESPIRATORY_TRACT | 2 refills | Status: DC

## 2022-02-04 MED ORDER — PREDNISONE 20 MG PO TABS: ORAL_TABLET | ORAL | 0 refills | Status: DC

## 2022-02-04 NOTE — Addendum Note (Signed)
Addended by: Elton Sin on: 02/04/2022 04:16 PM   Modules accepted: Orders

## 2022-02-04 NOTE — Progress Notes (Signed)
Vincent Wiley    [id number]    Sep 03, 1941  Primary Care Physician:Street, Sharon Mt, MD  Referring Physician: Venetia Maxon, Sharon Mt, MD [place],  [place] [address]  Chief complaint: Follow-up for pulmonary fibrosis  HPI: 81 year old with history of coronary artery disease, paroxysmal atrial fibrillation, hypertension, dyslipidemia. Follows with Dr. Geraldo Pitter, cardiology.  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  No feather pillows or comforters Smoking history: 26-pack-year smoker.  Quit in 1984 Travel history: No significant travel history Relevant family history: No family history of lung disease  Interim history: Started on Esbriet in October 2022.  This has been poorly tolerated with weight loss, loss of appetite, fatigue He stopped Esbriet for 2 weeks in December and again in January due to recurrent symptoms Is here for discussion of alternate medication  Reports worsening shortness of breath over the past 10 days  Outpatient Encounter Medications as of 02/04/2022  Medication Sig   alfuzosin (UROXATRAL) 10 MG 24 hr tablet Take 10 mg by mouth daily with breakfast.   amiodarone (PACERONE) 200 MG tablet Take 1 tablet (200 mg total) by mouth daily.   apixaban (ELIQUIS) 5 MG TABS tablet Take 1 tablet (5 mg total) by mouth 2 (two) times daily.   bethanechol (URECHOLINE) 25 MG tablet Take 25 mg by mouth 2 (two) times daily.   carvedilol (COREG) 3.125 MG tablet Take 3.125 mg by mouth 2 (two) times daily.   Cholecalciferol (D3 2000) 50 MCG (2000 UT) CAPS Take 2,000  Units by mouth daily at 12 noon.   Coenzyme Q10 (COQ10) 100 MG CAPS Take 100 mg by mouth daily.   fluticasone (FLONASE) 50 MCG/ACT nasal spray Place 1 spray into both nostrils 2 (two) times daily.   Multiple Vitamins-Minerals (VITAMIN D3 COMPLETE PO) Take 1 tablet by mouth daily.   pantoprazole (PROTONIX) 40 MG tablet Take 40 mg by mouth 2 (two) times daily.   Probiotic Product (PROBIOTIC DAILY PO) Take 1 tablet by mouth daily.   simvastatin (ZOCOR) 20 MG tablet Take 20 mg by mouth every evening.   sucralfate (CARAFATE) 1 GM/10ML suspension Take 1 g by mouth 2 (two) times daily.   telmisartan (MICARDIS) 20 MG tablet Take 10 mg by mouth daily.   valACYclovir (VALTREX) 500 MG tablet Take 500 mg by mouth 2 (two) times daily as needed for other (Fever blisters). Fever blisters   Pirfenidone (ESBRIET) 267 MG TABS Take 1 tab three times daily for 7 days, then 2 tabs three times daily for 7 days, then 3 tabs three times daily thereafter. (Patient not taking: Reported on 02/04/2022)   Pirfenidone (ESBRIET) 267 MG TABS Take 3 tablets (801 mg total) by mouth 3 (three) times daily with meals. Maintenance dose (Month 2 and onward) (Patient not taking: Reported on 02/04/2022)   No facility-administered encounter medications on file as of 02/04/2022.    Physical Exam: Blood pressure 126/62, pulse 79, temperature 98 F (36.7 C), temperature source Oral, height 5\' 9"  (1.753 m), weight 184 lb 6.4 oz (83.6 kg), SpO2 97 %. Gen:  No acute distress HEENT:  EOMI, sclera anicteric Neck:     No masses; no thyromegaly Lungs:    Clear to auscultation bilaterally; normal respiratory effort CV:         Regular rate and rhythm; no murmurs Abd:      + bowel sounds; soft, non-tender; no palpable masses, no distension Ext:    No edema; adequate peripheral perfusion Skin:      Warm and dry; no rash Neuro: alert and oriented x 3 Psych: normal mood and affect   Data Reviewed: Imaging: High-res CT 03/28/2021-pulmonary  fibrosis in UIP pattern.  Honeycombing present, bronchial wall thickening, emphysema, coronary artery disease, aortic aneurysm, enlargement of pulmonary artery  CT chest 05/03/2021-redemonstrated pulmonary fibrosis I have reviewed the images personally.  Cardiac: Echocardiogram 04/03/2019- LVEF 60 to 65%, mild concentric LVH, grade 3 diastolic dysfunction, mildly elevated PA systolic pressure, ASD  PFTs: 09/03/2021 FVC 2.70 [102%], FEV1 3.22 [126%], F/F 87, TLC 5.21 [79%], DLCO 13.51 [59%] Severe diffusion defect  Labs: CTD serologies 09/03/2021- negative  Hepatic panel from primary care dated 12/01/2021-within normal limits Hepatic panel from primary care dated 01/03/2022-within normal limits  Assessment:  Pulmonary fibrosis, IPF I have reviewed prior CT scans and typical UIP pattern with honeycombing.  This is likely to be idiopathic pulmonary fibrosis in the absence of any exposures or signs and symptoms of connective tissue disease.  CTD serologies are negative  Discussed findings and diagnosis with patient No need for biopsy as he diagnosis of IPF is fairly certain We discussed antifibrotic's and have decided to initiate Esbriet However this has been poorly tolerated and we will switch to Ofev He had labs recently at primary care  He has worsening dyspnea over the past 10 days with mild wheeze on examination Start Trelegy inhaler and prednisone 40 mg a day for 5 days Chest x-ray today On exertion his sats dropped remain 88% and above and does not require supplemental oxygen Continue pulmonary rehab  Pulmonary hypertension He has mild elevation in PA systolic pressure on echocardiogram This is likely secondary to left heart disease with diastolic dysfunction and OSA May need right heart cath later  Obstructive sleep apnea Compliant with CPAP.  Follows with Dr. Maxwell Caul  Plan/Recommendations: Discontinue Esbriet, start Ofev Trelegy, prednisone 40 mg a day for 5 days Chest  x-ray Pulmonary rehab  [name] MD [facility] 02/04/2022, 1:33 PM  CC: Street, Sharon Mt, *  Addendum:  [name] MD [facility] 02/04/2022, 1:33 PM

## 2022-02-04 NOTE — Addendum Note (Signed)
Addended by: Elton Sin on: 02/04/2022 01:54 PM   Modules accepted: Orders

## 2022-02-04 NOTE — Patient Instructions (Signed)
Start Trelegy inhaler and prednisone 40 mg a day for 5 days Chest x-ray today Start paperwork for Ofev  Follow-up in 2 to 3 months

## 2022-02-07 ENCOUNTER — Telehealth: Payer: Self-pay | Admitting: Pharmacist

## 2022-02-07 DIAGNOSIS — J849 Interstitial pulmonary disease, unspecified: Secondary | ICD-10-CM

## 2022-02-07 NOTE — Telephone Encounter (Signed)
Patient switching from Esbriet to [place]. Received new start paperwork - patient does not qualify for BI Cares PAP due to [facility] coverage.  Submitted a Prior Authorization request to [employer] for OFEV via CoverMyMeds. Will update once we receive a response.  Key: Quincy Carnes, PharmD, MPH, BCPS Clinical Pharmacist (Rheumatology and Pulmonology)

## 2022-02-08 DIAGNOSIS — L570 Actinic keratosis: Secondary | ICD-10-CM | POA: Diagnosis not present

## 2022-02-11 ENCOUNTER — Other Ambulatory Visit (HOSPITAL_COMMUNITY): Payer: Self-pay

## 2022-02-11 MED ORDER — OFEV 150 MG PO CAPS: 150.0000 mg | ORAL_CAPSULE | Freq: Two times a day (BID) | ORAL | 5 refills | Status: DC

## 2022-02-11 NOTE — Telephone Encounter (Signed)
Received notification from [place] regarding a prior authorization for [place]. Authorization has been APPROVED from 01/08/22 to 02/08/23.   Unable to run test claim - cost exceeds maximum. Pharmacy will have to call [employer] help desk for override.  Patient can fill through [facility] (pulmonary fibrosis team). [phone]  Authorization # [id number]  Pirfenidone removed from patient's medication list today. Rx sent to [facility]. Patient provided with pharmacy phone number. Advised that I was unable to confirm copay but may by no copay or $38 per month. Her verbalized understanding.  Patient counseled on purpose, proper use, and potential adverse effects including diarrhea, nausea, vomiting, abdominal pain, decreased appetite, weight loss, and increased blood pressure.  Reviewed that he can take loperamide for up to 48 hours, but should notify our clinic if he has severe diarrhea or diarrhea is persistent despite Imodium use for 48 hours.  Advised that diarrhea is reversible once Ofev is held  Stressed the importance of routine lab monitoring. Will monitor LFT's every month for the first 6 months of treatment then every 3 months. Will monitor CBC every 3 months.  Patient does not have history of diverticulitis. He has history of afib and aortic aneurysm. Patient takes apixaban - reviewed theoretical small increased risk for bleeding. Advised to monitor for any unusual blood in urine, stool, nosebleeds. He verbalized understanding.  Ofev dose will be 150 mg capsule every 12 hours with food. Stressed importance of taking with food to minimize stomach upset.   F/u with Dr. Vaughan Browner is scheduled for 04/13/22  Knox Saliva, PharmD, MPH, BCPS Clinical Pharmacist (Rheumatology and Pulmonology)

## 2022-02-15 ENCOUNTER — Telehealth: Payer: Self-pay | Admitting: Pulmonary Disease

## 2022-02-15 DIAGNOSIS — K921 Melena: Secondary | ICD-10-CM

## 2022-02-15 NOTE — Telephone Encounter (Signed)
Spoke to the patient just now and he tells me that he has has blood in his stools for several years now. He states 2-3 years. He tells me this has not gotten any better or worse with his blood thinner. He tells me that Dr. Melina Copa checks on this every 6 months.   He tells me that he has been having blood in his nose only when he blows his nose. This has been happening for the past three weeks.   He does not sound concerned about this at all.

## 2022-02-15 NOTE — Telephone Encounter (Signed)
I dont think the ofev is casing the bleed as we just started it this week.   He will need to check with Dr. Geraldo Pitter his cardiologist about stopping eliquis as he is on it for the atrial fibrillation.  Will Cc- Revankar.

## 2022-02-15 NOTE — Telephone Encounter (Signed)
Patient is aware of below message and voiced his understanding.  He is aware that our office will contact him with Dr. Geraldo Pitter response.

## 2022-02-15 NOTE — Telephone Encounter (Signed)
Called and spoke with pt about the message received from [place].  When asked pt about the blood in stool that he has had, pt said that he has had this problem for years even before beginning OFEV and states that it has not gotten any worse. When asked pt about the nose bleeds, he said that this first started happening after he began taking Eliquis and said every now and then he will have some bleeding from left nostril. Asked pt if he uses a nasal spray in case this could be coming from nasal dryness and he states that he does use Flonase ever day.  Routing to Dr. Vaughan Browner for any advice.

## 2022-02-15 NOTE — Telephone Encounter (Signed)
Spoke to the patient just now. He tells me that he had blood work completed at [facility] but it has been several months since then. I put in an order for him to have his labs drawn here in our office and he states he will get this done tomorrow.   I also sent a fax to [facility] to get his most recent CBC or H&H so that Dr. Geraldo Pitter can compare these findings.

## 2022-02-15 NOTE — Addendum Note (Signed)
Addended by: Resa Miner I on: 02/15/2022 04:36 PM   Modules accepted: Orders

## 2022-02-16 DIAGNOSIS — K921 Melena: Secondary | ICD-10-CM | POA: Diagnosis not present

## 2022-02-16 NOTE — Addendum Note (Signed)
Addended by: Truddie Hidden on: 02/16/2022 09:18 AM   Modules accepted: Orders

## 2022-02-16 NOTE — Addendum Note (Signed)
Addended by: Truddie Hidden on: 02/16/2022 09:16 AM   Modules accepted: Orders

## 2022-02-17 LAB — CBC
Hematocrit: 34.6 % — ABNORMAL LOW (ref 37.5–51.0)
Hemoglobin: 11.6 g/dL — ABNORMAL LOW (ref 13.0–17.7)
MCH: 31.7 pg (ref 26.6–33.0)
MCHC: 33.5 g/dL (ref 31.5–35.7)
MCV: 95 fL (ref 79–97)
Platelets: 213 10*3/uL (ref 150–450)
RBC: 3.66 x10E6/uL — ABNORMAL LOW (ref 4.14–5.80)
RDW: 12.1 % (ref 11.6–15.4)
WBC: 11.9 10*3/uL — ABNORMAL HIGH (ref 3.4–10.8)

## 2022-02-23 ENCOUNTER — Other Ambulatory Visit (HOSPITAL_COMMUNITY): Payer: Self-pay

## 2022-02-23 ENCOUNTER — Telehealth: Payer: Self-pay | Admitting: Pulmonary Disease

## 2022-02-23 DIAGNOSIS — D519 Vitamin B12 deficiency anemia, unspecified: Secondary | ICD-10-CM | POA: Diagnosis not present

## 2022-02-23 NOTE — Telephone Encounter (Addendum)
I called patient to determine how long patient has been on Ofev. He states he stopped Esbriet 3 weeks ago. After multiple bouts of clarification, patient states he has in fact not received any Ofev and therefore has not started Ofev. He states that his copay $24 through [facility] - he was told he could fill throuh [facility]. I advised that I had reviewed with him on 02/11/22 that Ofev is not contracted to be dispensed by [facility]. Patient states that he sees GI twice yearly and and is stable. ? ?I advised that his CBC was stable as reviewed by his cardiologist Dr. Geraldo Pitter. ? ?Patient provided with [place] phone number again to schedule shipment. I inquired if patient manages his own medications as there is some clear confusion, but patient states that his spouse does not help and he manages his own. ? ?I called [facility] and provided pharmacist, Bethena Roys, with verbal confirmation that they can fill Ofev and MD is aware ? ?Knox Saliva, PharmD, MPH, BCPS ?Clinical Pharmacist (Rheumatology and Pulmonology) ?

## 2022-02-25 IMAGING — DX DG CHEST 2V
2 series · 2 of 2 positions shown · non-contrast
Comparison: Prior chest x-ray 04/05/2021

CLINICAL DATA: Dyspnea

EXAM:
CHEST - 2 VIEW

[chest pa]
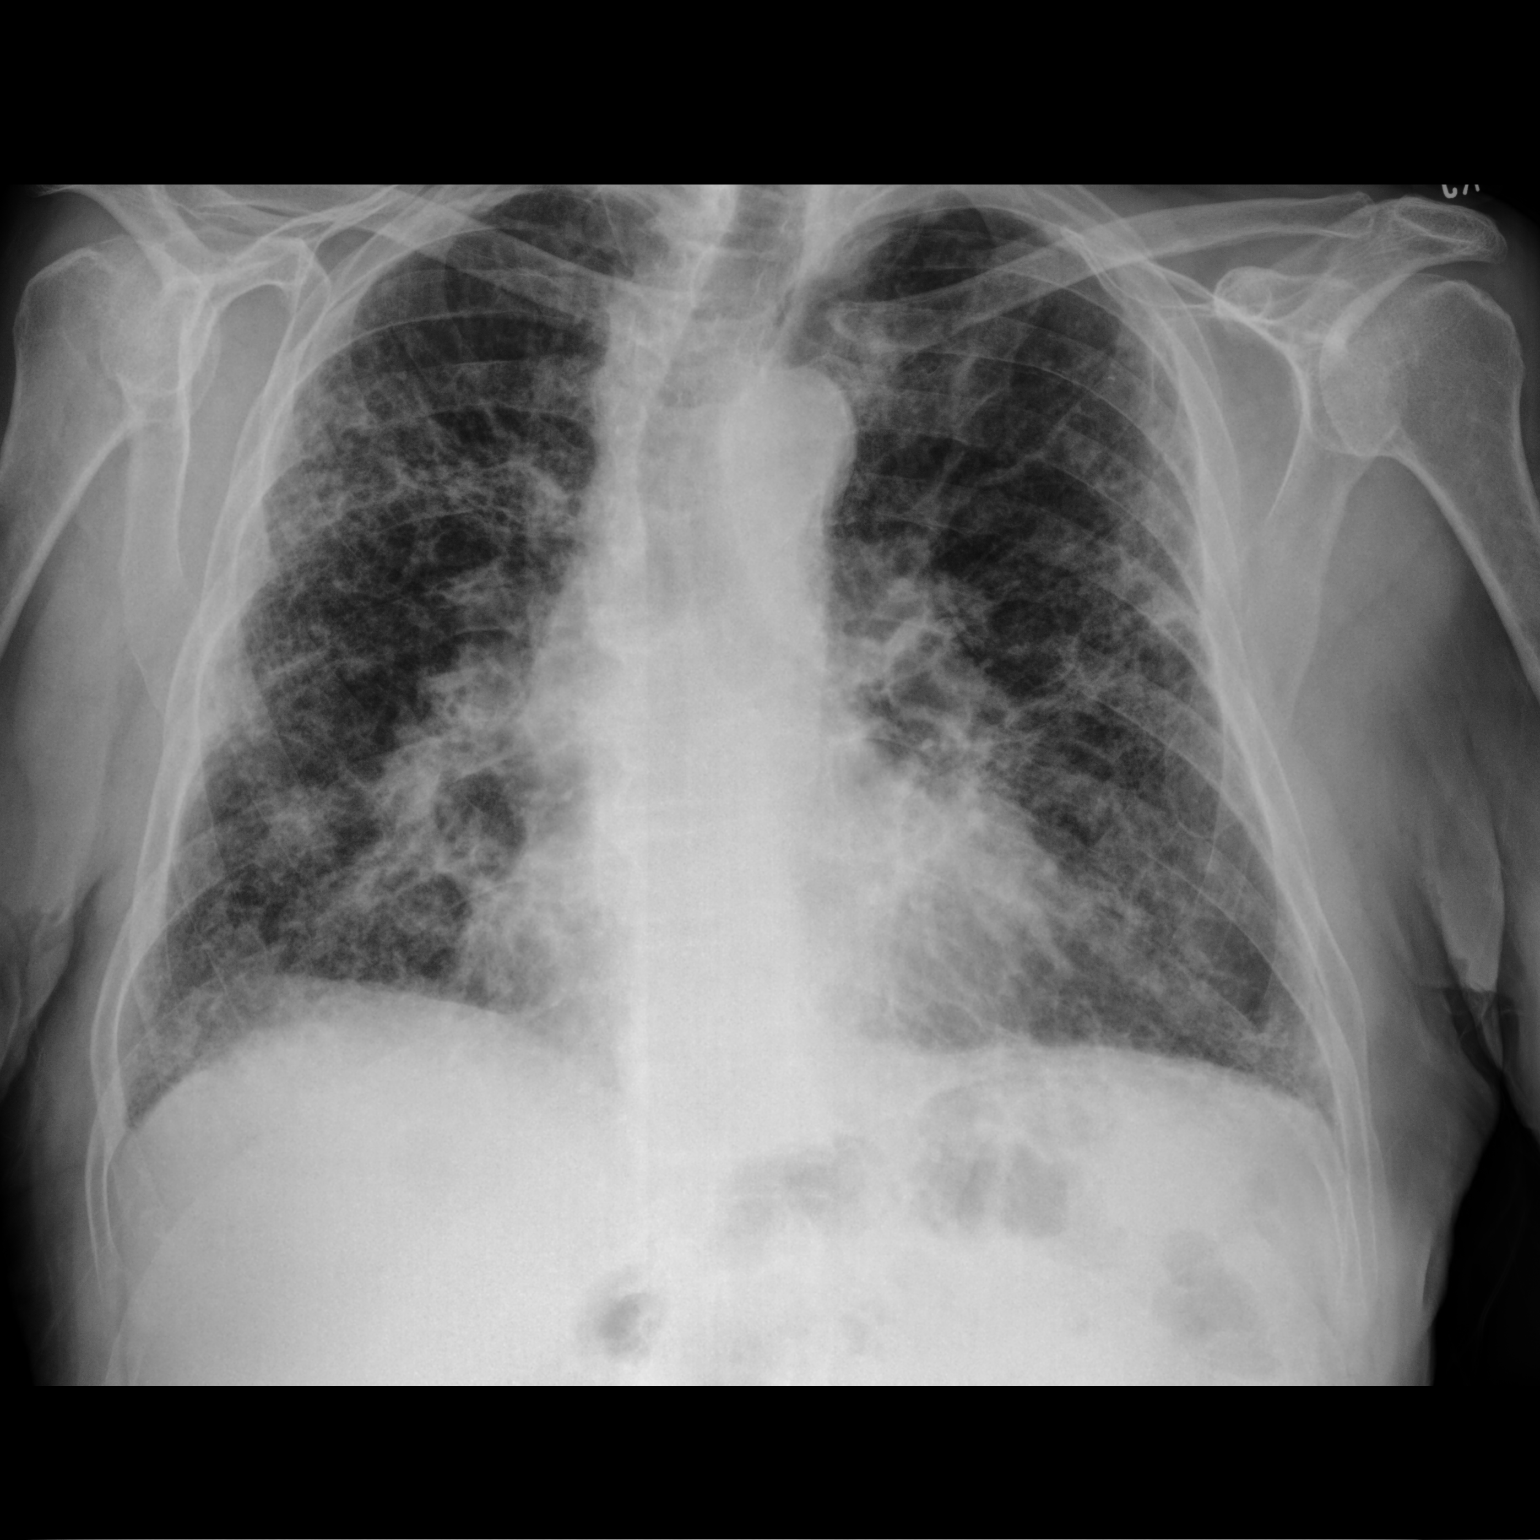

[chest lat]
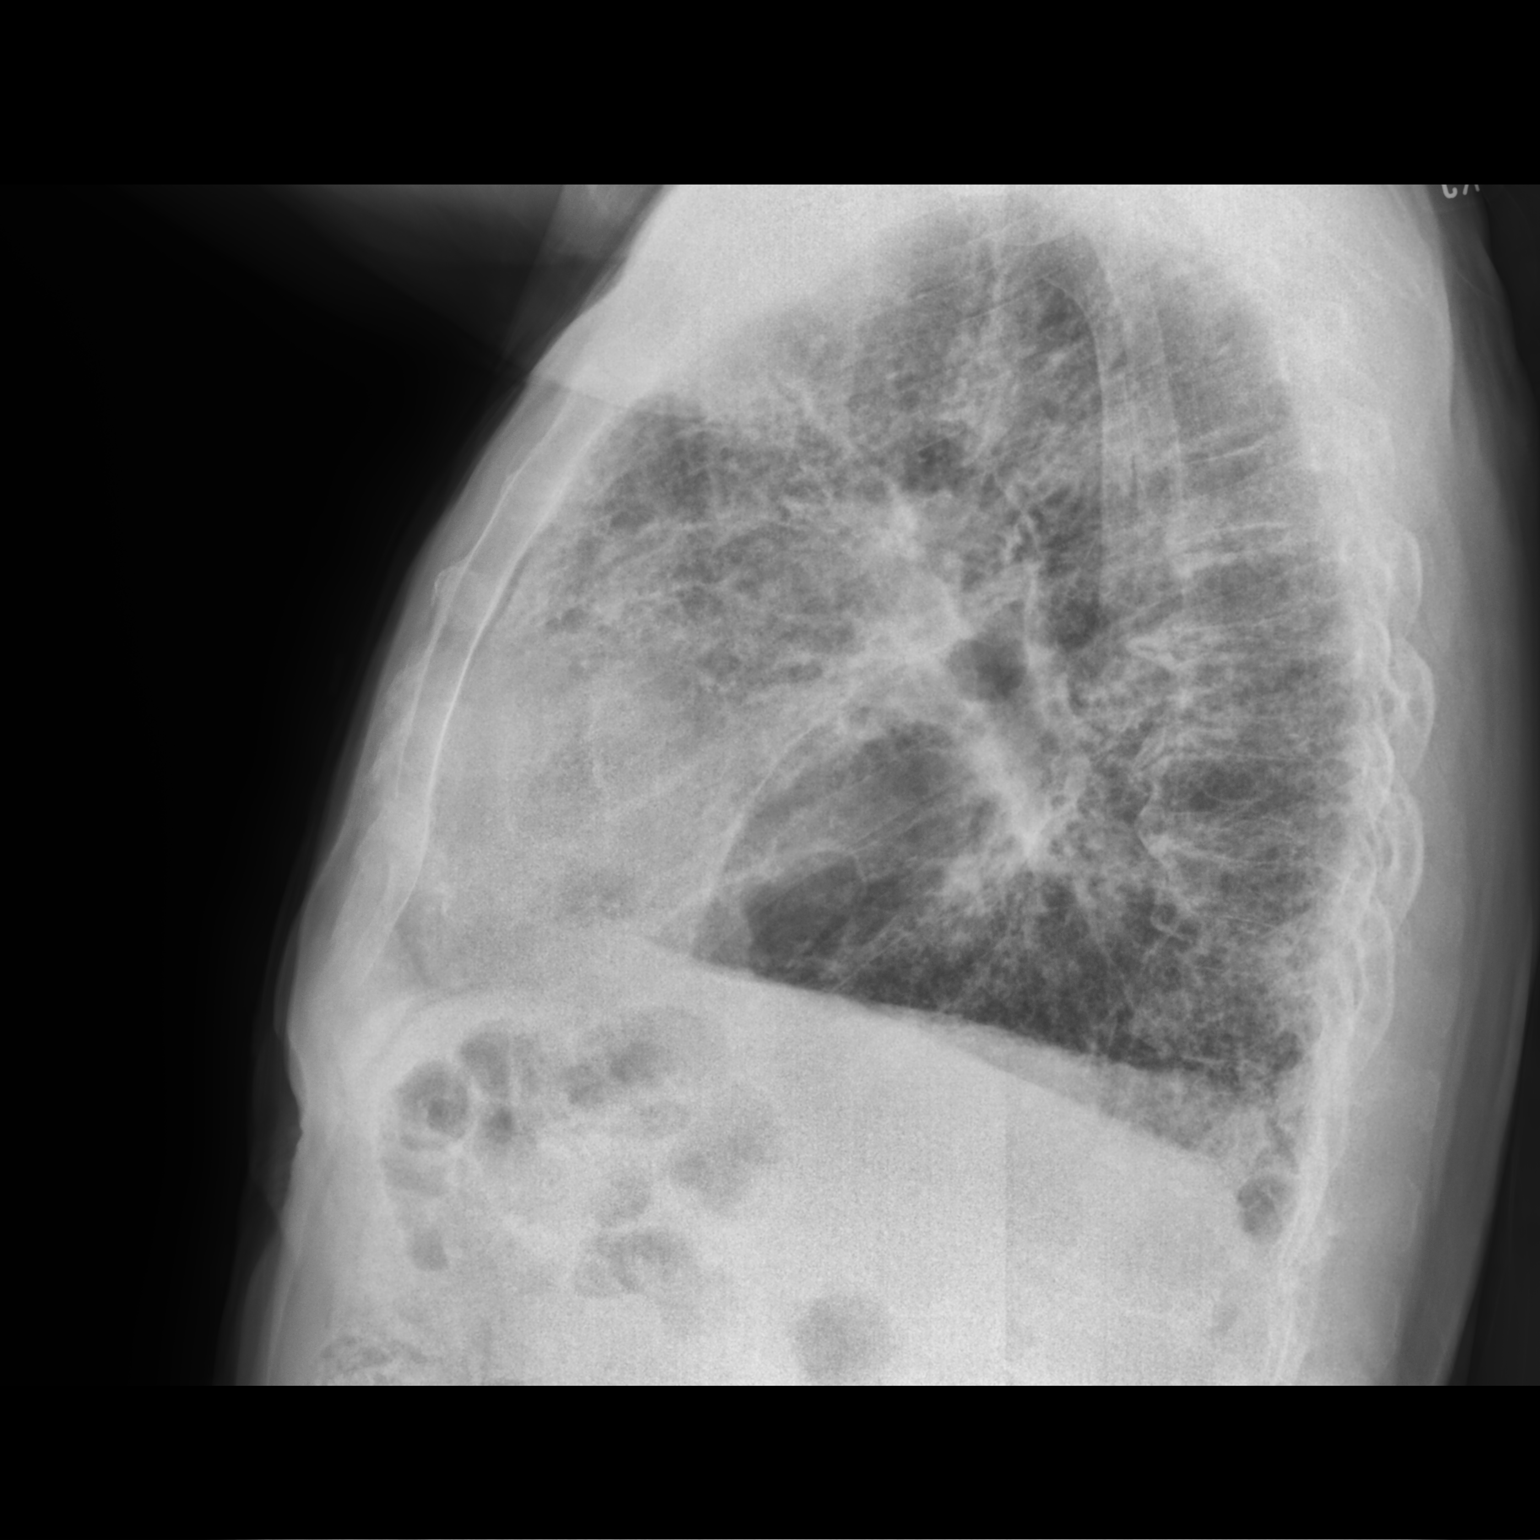

[2 of 2 positions shown; findings below may reference images not displayed]

FINDINGS: Diffuse peripheral and basilar reticular airspace opacities. Overall
lung volumes are low. Findings are most consistent with interstitial
lung disease/pulmonary fibrosis. Prior CT imaging demonstrated
findings consistent with usual interstitial pneumonitis. No new
focal airspace opacity. Cardiac and mediastinal contours remain
unchanged. No pleural effusion or pneumothorax. No acute osseous
abnormality.
IMPRESSION: Stable appearance of pulmonary fibrosis previously identified as
usual interstitial pneumonitis pattern.

No acute cardiopulmonary process identified.

## 2022-03-03 DIAGNOSIS — Z5181 Encounter for therapeutic drug level monitoring: Secondary | ICD-10-CM | POA: Diagnosis not present

## 2022-03-25 DIAGNOSIS — E538 Deficiency of other specified B group vitamins: Secondary | ICD-10-CM | POA: Diagnosis not present

## 2022-03-30 DIAGNOSIS — G4733 Obstructive sleep apnea (adult) (pediatric): Secondary | ICD-10-CM | POA: Diagnosis not present

## 2022-04-01 DIAGNOSIS — D500 Iron deficiency anemia secondary to blood loss (chronic): Secondary | ICD-10-CM | POA: Diagnosis not present

## 2022-04-04 DIAGNOSIS — Z5181 Encounter for therapeutic drug level monitoring: Secondary | ICD-10-CM | POA: Diagnosis not present

## 2022-04-05 DIAGNOSIS — D500 Iron deficiency anemia secondary to blood loss (chronic): Secondary | ICD-10-CM | POA: Diagnosis not present

## 2022-04-05 DIAGNOSIS — Z7901 Long term (current) use of anticoagulants: Secondary | ICD-10-CM | POA: Diagnosis not present

## 2022-04-05 DIAGNOSIS — K2971 Gastritis, unspecified, with bleeding: Secondary | ICD-10-CM | POA: Diagnosis not present

## 2022-04-05 DIAGNOSIS — R195 Other fecal abnormalities: Secondary | ICD-10-CM | POA: Diagnosis not present

## 2022-04-05 DIAGNOSIS — K219 Gastro-esophageal reflux disease without esophagitis: Secondary | ICD-10-CM | POA: Diagnosis not present

## 2022-04-13 ENCOUNTER — Encounter: Payer: Self-pay | Admitting: Pulmonary Disease

## 2022-04-13 ENCOUNTER — Ambulatory Visit (INDEPENDENT_AMBULATORY_CARE_PROVIDER_SITE_OTHER): Payer: Medicare PPO | Admitting: Pulmonary Disease

## 2022-04-13 VITALS — BP 130/82 | HR 67 | Temp 97.8°F | Ht 70.0 in | Wt 178.4 lb

## 2022-04-13 DIAGNOSIS — J849 Interstitial pulmonary disease, unspecified: Secondary | ICD-10-CM | POA: Diagnosis not present

## 2022-04-13 DIAGNOSIS — J84112 Idiopathic pulmonary fibrosis: Secondary | ICD-10-CM

## 2022-04-13 DIAGNOSIS — Z5181 Encounter for therapeutic drug level monitoring: Secondary | ICD-10-CM

## 2022-04-13 NOTE — Progress Notes (Addendum)
Vincent Wiley    [id number]    [birth year]  Primary Care Physician:Street, Vincent Mt, MD  Referring Physician: Venetia Wiley, Vincent Mt, MD [address] [place],  [place] [address]  Chief complaint: Follow-up for IPF.  HPI: 81 year old with history of coronary artery disease, paroxysmal atrial fibrillation, hypertension, dyslipidemia. Follows with Dr. Geraldo Wiley, cardiology.  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  No feather pillows or comforters Smoking history: 26-pack-year smoker.  Quit in 1984 Travel history: No significant travel history Relevant family history: No family history of lung disease  Interim history: Started on Esbriet in October 2022.  This has been poorly tolerated with weight loss, loss of appetite, fatigue Started Ofev in February 2023.  He is tolerating it well with occasional diarrhea.  Outpatient Encounter Medications as of 04/13/2022  Medication Sig   alfuzosin (UROXATRAL) 10 MG 24 hr tablet Take 10 mg by mouth daily with breakfast.   amiodarone (PACERONE) 200 MG tablet Take 1 tablet (200 mg total) by mouth daily.   apixaban (ELIQUIS) 5 MG TABS tablet Take 1 tablet (5 mg total) by mouth 2 (two) times daily.   bethanechol (URECHOLINE) 25 MG tablet Take 25 mg by mouth 2 (two) times daily.   carvedilol (COREG) 3.125 MG tablet Take 3.125 mg by mouth 2 (two) times daily.   Cholecalciferol (D3 2000) 50 MCG (2000 UT) CAPS Take 2,000 Units by mouth daily at 12 noon.   Coenzyme Q10 (COQ10) 100 MG CAPS Take 100 mg by mouth daily.   fluticasone (FLONASE) 50  MCG/ACT nasal spray Place 1 spray into both nostrils 2 (two) times daily.   Fluticasone-Umeclidin-Vilant (TRELEGY ELLIPTA) 200-62.5-25 MCG/ACT AEPB Inhale 1 puff into the lungs daily.   Multiple Vitamins-Minerals (VITAMIN D3 COMPLETE PO) Take 1 tablet by mouth daily.   Nintedanib (OFEV) 150 MG CAPS Take 1 capsule (150 mg total) by mouth 2 (two) times daily.   pantoprazole (PROTONIX) 40 MG tablet Take 40 mg by mouth 2 (two) times daily.   Probiotic Product (PROBIOTIC DAILY PO) Take 1 tablet by mouth daily.   simvastatin (ZOCOR) 20 MG tablet Take 20 mg by mouth every evening.   sucralfate (CARAFATE) 1 GM/10ML suspension Take 1 g by mouth 2 (two) times daily.   telmisartan (MICARDIS) 20 MG tablet Take 10 mg by mouth daily.   valACYclovir (VALTREX) 500 MG tablet Take 500 mg by mouth 2 (two) times daily as needed for other (Fever blisters). Fever blisters   [DISCONTINUED] Fluticasone-Umeclidin-Vilant (TRELEGY ELLIPTA) 200-62.5-25 MCG/ACT AEPB Inhale 1 puff into the lungs daily.   [DISCONTINUED] predniSONE (DELTASONE) 20 MG tablet Take '40mg'$  for 5 days   No facility-administered encounter medications on file as of 04/13/2022.    Physical Exam: Blood pressure 130/82, pulse 67, temperature 97.8 F (36.6 C), temperature source Oral, height '5\' 10"'$  (1.778 m), weight 178 lb 6.4 oz (80.9 kg), SpO2 96 %. Gen:      No acute distress HEENT:  EOMI, sclera anicteric Neck:     No masses; no thyromegaly Lungs:    Bibasal crackles CV:  Regular rate and rhythm; no murmurs Abd:      + bowel sounds; soft, non-tender; no palpable masses, no distension Ext:    No edema; adequate peripheral perfusion Skin:      Warm and dry; no rash Neuro: alert and oriented x 3 Psych: normal mood and affect   Data Reviewed: Imaging: High-res CT 03/28/2021-pulmonary fibrosis in UIP pattern.  Honeycombing present, bronchial wall thickening, emphysema, coronary artery disease, aortic aneurysm, enlargement of pulmonary  artery  CT chest 05/03/2021-redemonstrated pulmonary fibrosis  Chest x-ray 02/04/2022-stable pulmonary fibrosis with no acute abnormality. I have reviewed the images personally.  Cardiac: Echocardiogram 04/03/2019- LVEF 60 to 65%, mild concentric LVH, grade 3 diastolic dysfunction, mildly elevated PA systolic pressure, ASD  PFTs: 09/03/2021 FVC 2.70 [102%], FEV1 3.22 [126%], F/F 87, TLC 5.21 [79%], DLCO 13.51 [59%] Severe diffusion defect  Labs: CTD serologies 09/03/2021- negative  Hepatic panel from primary care dated 04/04/22- within normal limits  Assessment:  Pulmonary fibrosis, IPF I have reviewed prior CT scans and typical UIP pattern with honeycombing.  This is likely to be idiopathic pulmonary fibrosis in the absence of any exposures or signs and symptoms of connective tissue disease.  CTD serologies are negative  Discussed findings and diagnosis with patient No need for biopsy as he diagnosis of IPF is fairly certain Esbriet is poorly tolerated and he is now on Ofev He had labs recently at primary care which are within normal limits  He was given Trelegy and prednisone at last visit for wheezing but is better now.  Will attempt to get him off the inhaler as he does not have significant obstruction on PFTs. On exertion his sats dropped remain 88% and above and does not require supplemental oxygen Continue pulmonary rehab  Pulmonary hypertension He has mild elevation in PA systolic pressure on echocardiogram This is likely secondary to left heart disease with diastolic dysfunction and OSA May need right heart cath later  Obstructive sleep apnea Compliant with CPAP and 2 L oxygen at night.  Follows with Dr. Maxwell Wiley Reports increased dyspnea when he wakes up.  Check overnight oximetry on CPAP and supplemental oxygen   Plan/Recommendations: Discontinue Esbriet, start Ofev Stop Trelegy Monitor labs Follow-up high-res CT and PFTs in 3 months.  Vincent Garfinkel MD [facility] 04/13/2022, 3:48 PM  CC: Street, Vincent Wiley, *   Addendum: Hepatic panel from primary care dated 07/06/2022 Within normal limits except for low albumin of 3.3

## 2022-04-13 NOTE — Patient Instructions (Addendum)
Continue the Ofev ?I have reviewed your labs from your primary care which are normal ?We will order overnight oximetry on CPAP and supplemental oxygen 2 L ?Order high-res CT and PFTs in 3 months.  Follow-up in clinic after these tests. ?

## 2022-04-18 DIAGNOSIS — R339 Retention of urine, unspecified: Secondary | ICD-10-CM | POA: Diagnosis not present

## 2022-04-18 DIAGNOSIS — N401 Enlarged prostate with lower urinary tract symptoms: Secondary | ICD-10-CM | POA: Diagnosis not present

## 2022-04-18 DIAGNOSIS — R972 Elevated prostate specific antigen [PSA]: Secondary | ICD-10-CM | POA: Diagnosis not present

## 2022-04-19 ENCOUNTER — Other Ambulatory Visit: Payer: Self-pay

## 2022-04-19 DIAGNOSIS — H903 Sensorineural hearing loss, bilateral: Secondary | ICD-10-CM | POA: Insufficient documentation

## 2022-04-19 DIAGNOSIS — H6123 Impacted cerumen, bilateral: Secondary | ICD-10-CM | POA: Insufficient documentation

## 2022-04-19 DIAGNOSIS — Z461 Encounter for fitting and adjustment of hearing aid: Secondary | ICD-10-CM

## 2022-04-19 DIAGNOSIS — Z23 Encounter for immunization: Secondary | ICD-10-CM | POA: Insufficient documentation

## 2022-04-19 HISTORY — DX: Impacted cerumen, bilateral: H61.23

## 2022-04-19 HISTORY — DX: Encounter for immunization: Z23

## 2022-04-19 HISTORY — DX: Encounter for fitting and adjustment of hearing aid: Z46.1

## 2022-04-19 HISTORY — DX: Sensorineural hearing loss, bilateral: H90.3

## 2022-04-20 ENCOUNTER — Ambulatory Visit (INDEPENDENT_AMBULATORY_CARE_PROVIDER_SITE_OTHER): Payer: Medicare PPO | Admitting: Cardiology

## 2022-04-20 ENCOUNTER — Encounter: Payer: Self-pay | Admitting: Cardiology

## 2022-04-20 VITALS — BP 122/70 | HR 83 | Ht 69.6 in | Wt 174.8 lb

## 2022-04-20 DIAGNOSIS — E782 Mixed hyperlipidemia: Secondary | ICD-10-CM | POA: Diagnosis not present

## 2022-04-20 DIAGNOSIS — I10 Essential (primary) hypertension: Secondary | ICD-10-CM | POA: Diagnosis not present

## 2022-04-20 DIAGNOSIS — I480 Paroxysmal atrial fibrillation: Secondary | ICD-10-CM

## 2022-04-20 NOTE — Progress Notes (Signed)
?Cardiology Office Note:   ? ?Date:  04/20/2022  ? ?ID:  Vincent Wiley, DOB 14-Apr-1941, MRN [id number] ? ?PCP:  Street, Sharon Mt, MD  ?Cardiologist:  Jenean Lindau, MD  ? ?Referring MD: Street, Sharon Mt, *  ? ? ?ASSESSMENT:   ? ?1. Paroxysmal atrial fibrillation (HCC)   ?2. Mixed hyperlipidemia   ?3. Essential (primary) hypertension   ?4. Benign essential hypertension   ? ?PLAN:   ? ?In order of problems listed above: ? ?Coronary arteriosclerosis: Secondary prevention stressed to the patient.  Importance of compliance with diet medication stressed any vocalized understanding.  Unfortunately has limited ability to be active because of idiopathic pulmonary fibrosis and the fact that he is on oxygen. ?Coronary artery disease: Stable at this time.  Asymptomatic.  Medical management. ?Paroxysmal atrial fibrillation:I discussed with the patient atrial fibrillation, disease process. Management and therapy including rate and rhythm control, anticoagulation benefits and potential risks were discussed extensively with the patient. Patient had multiple questions which were answered to patient's satisfaction.  Amiodarone therapy benefits and potential risks explained and he vocalized understanding and questions were answered to satisfaction.  This is also followed by his pulmonologist. ?Mixed dyslipidemia: On lipid-lowering therapy and managed by primary care. ?Ascending aortic dilatation: He is planning to undergo CT scan by pulmonologist and will send me a copy of that report.  We will keep an eye on the dimensions of the aorta and monitor him closely. ?Patient will be seen in follow-up appointment in 9 months or earlier if the patient has any concerns ? ? ? ?Medication Adjustments/Labs and Tests Ordered: ?Current medicines are reviewed at length with the patient today.  Concerns regarding medicines are outlined above.  ?No orders of the defined types were placed in this encounter. ? ?No orders of the defined types  were placed in this encounter. ? ? ? ?No chief complaint on file. ?  ? ?History of Present Illness:   ? ?Vincent Wiley is a 81 y.o. male.  Patient has past medical history of dilated ascending aorta, essential hypertension, coronary atherosclerosis and paroxysmal atrial fibrillation.  He has pulmonary fibrosis.  He denies any problems at this time and takes care of activities of daily living.  No chest pain orthopnea or PND.  At the time of my evaluation, the patient is alert awake oriented and in no distress. ? ?Past Medical History:  ?Diagnosis Date  ? Acid reflux   ? Allergic rhinitis 01/12/2022  ? 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 Chronic GI losses, B12 Deficiency  ? Ascending aortic aneurysm ([place]) 06/08/2021  ? Benign essential hypertension   ? Benign prostatic hyperplasia 01/12/2022  ? BPH with obstruction/lower urinary tract symptoms   ? Cardiac murmur 03/31/2021  ? Coronary atherosclerosis 06/08/2021  ? DOE (dyspnea on exertion)   ? Encounter for fitting and adjustment of hearing aid 04/19/2022  ? Encounter for immunization 04/19/2022  ? Essential (primary) hypertension 01/12/2022  ? Gastroesophageal reflux disease 01/12/2022  ? Hyperlipidemia   ? Idiopathic pulmonary fibrosis ([place]) 01/12/2022  ? Impacted cerumen, bilateral 04/19/2022  ? Kidney disease   ? Obstructive sleep apnea syndrome 04/30/2013  ? NPSG 01/2012:  AHI 27/hr with desat to 86%.  ? Paroxysmal atrial fibrillation ([place]) 03/31/2021  ? Pernicious anemia   ? Pulmonary fibrosis ([place]) 03/31/2021  ? Seasonal allergies   ? Sensorineural hearing loss, bilateral 04/19/2022  ? Sleep apnea   ? Vitamin B12 deficiency   ? ? ?Past Surgical History:  ?Procedure  Laterality Date  ? EYE SURGERY Bilateral   ? right eye realignment//cataract left eye  ? HERNIA REPAIR    ? umbilical  ? MULTIPLE TOOTH EXTRACTIONS    ? REPLACEMENT TOTAL KNEE Bilateral   ? ? ?Current Medications: ?Current Meds  ?Medication Sig  ? alfuzosin (UROXATRAL) 10 MG 24 hr tablet Take 10 mg by mouth  daily with breakfast.  ? amiodarone (PACERONE) 200 MG tablet Take 1 tablet (200 mg total) by mouth daily.  ? apixaban (ELIQUIS) 5 MG TABS tablet Take 1 tablet (5 mg total) by mouth 2 (two) times daily.  ? bethanechol (URECHOLINE) 50 MG tablet Take by mouth daily.  ? carvedilol (COREG) 3.125 MG tablet Take 3.125 mg by mouth 2 (two) times daily.  ? Cholecalciferol (D3 2000) 50 MCG (2000 UT) CAPS Take 2,000 Units by mouth daily at 12 noon.  ? Coenzyme Q10 (COQ10) 100 MG CAPS Take 100 mg by mouth daily.  ? fluticasone (FLONASE) 50 MCG/ACT nasal spray Place 1 spray into both nostrils 2 (two) times daily.  ? Fluticasone-Umeclidin-Vilant (TRELEGY ELLIPTA) 200-62.5-25 MCG/ACT AEPB Inhale 1 puff into the lungs daily.  ? Multiple Vitamins-Minerals (VITAMIN D3 COMPLETE PO) Take 1 tablet by mouth daily.  ? Nintedanib (OFEV) 150 MG CAPS Take 1 capsule (150 mg total) by mouth 2 (two) times daily.  ? pantoprazole (PROTONIX) 40 MG tablet Take 40 mg by mouth 2 (two) times daily.  ? Probiotic Product (PROBIOTIC DAILY PO) Take 1 tablet by mouth daily.  ? simvastatin (ZOCOR) 20 MG tablet Take 20 mg by mouth every evening.  ? sucralfate (CARAFATE) 1 GM/10ML suspension Take 1 g by mouth 2 (two) times daily.  ? telmisartan (MICARDIS) 20 MG tablet Take 10 mg by mouth daily.  ? valACYclovir (VALTREX) 500 MG tablet Take 500 mg by mouth 2 (two) times daily as needed for other (Fever blisters). Fever blisters  ?  ? ?Allergies:   Sulfamethoxazole, Amlodipine, Ceftriaxone, Ciprofloxacin, Cortisone acetate [cortisone], Elemental sulfur, Flomax [tamsulosin], Hibiclens [chlorhexidine gluconate], Lisinopril, Other, Penicillins, Probiata [lactobacillus], Rocephin [ceftriaxone sodium in dextrose], and Sulfa antibiotics  ? ?Social History  ? ?Socioeconomic History  ? Marital status: Unknown  ?  Spouse name: Not on file  ? Number of children: Not on file  ? Years of education: Not on file  ? Highest education level: Not on file  ?Occupational History  ?  Occupation: retired  ?Tobacco Use  ? Smoking status: Former  ?  Packs/day: 1.50  ?  Years: 25.00  ?  Pack years: 37.50  ?  Types: Cigarettes  ?  Quit date: 08/27/1983  ?  Years since quitting: 38.6  ? Smokeless tobacco: Never  ?Substance and Sexual Activity  ? Alcohol use: No  ? Drug use: No  ? Sexual activity: Not on file  ?Other Topics Concern  ? Not on file  ?Social History Narrative  ? Not on file  ? ?Social Determinants of Health  ? ?Financial Resource Strain: Not on file  ?Food Insecurity: Not on file  ?Transportation Needs: Not on file  ?Physical Activity: Not on file  ?Stress: Not on file  ?Social Connections: Not on file  ?  ? ?Family History: ?The patient's family history includes AAA (abdominal aortic aneurysm) in his father; Aneurysm in his mother; Breast cancer in his sister and sister; Heart attack in his father. ? ?ROS:   ?Please see the history of present illness.    ?All other systems reviewed and are negative. ? ?EKGs/Labs/Other Studies Reviewed:   ? ?  The following studies were reviewed today: ?I discussed my findings with the patient at length ? ? ?Recent Labs: ?06/08/2021: TSH 1.870 ?11/03/2021: ALT 10; BUN 19; Creatinine, Ser 1.39; Potassium 3.7; Sodium 137 ?02/16/2022: Hemoglobin 11.6; Platelets 213  ?Recent Lipid Panel ?No results found for: CHOL, TRIG, HDL, CHOLHDL, VLDL, LDLCALC, LDLDIRECT ? ?Physical Exam:   ? ?VS:  BP 122/70   Pulse 83   Ht 5' 9.6" (1.768 m)   Wt 174 lb 12.8 oz (79.3 kg)   SpO2 95%   BMI 25.37 kg/m?    ? ?Wt Readings from Last 3 Encounters:  ?04/20/22 174 lb 12.8 oz (79.3 kg)  ?04/13/22 178 lb 6.4 oz (80.9 kg)  ?02/04/22 184 lb 6.4 oz (83.6 kg)  ?  ? ?GEN: Patient is in no acute distress ?HEENT: Normal ?NECK: No JVD; No carotid bruits ?LYMPHATICS: No lymphadenopathy ?CARDIAC: Hear sounds regular, 2/6 systolic murmur at the apex. ?RESPIRATORY:  Clear to auscultation without rales, wheezing or rhonchi  ?ABDOMEN: Soft, non-tender, non-distended ?MUSCULOSKELETAL:  No edema; No  deformity  ?SKIN: Warm and dry ?NEUROLOGIC:  Alert and oriented x 3 ?PSYCHIATRIC:  Normal affect  ? ?Signed, ?Jenean Lindau, MD  ?04/20/2022 10:50 AM    ?[facility]  ?

## 2022-04-20 NOTE — Patient Instructions (Signed)

## 2022-04-21 DIAGNOSIS — R0683 Snoring: Secondary | ICD-10-CM | POA: Diagnosis not present

## 2022-04-21 DIAGNOSIS — G4730 Sleep apnea, unspecified: Secondary | ICD-10-CM | POA: Diagnosis not present

## 2022-04-25 DIAGNOSIS — D510 Vitamin B12 deficiency anemia due to intrinsic factor deficiency: Secondary | ICD-10-CM | POA: Diagnosis not present

## 2022-04-27 ENCOUNTER — Telehealth: Payer: Self-pay | Admitting: Pulmonary Disease

## 2022-04-27 NOTE — Telephone Encounter (Signed)
ONO results received this morning via fax from [facility].  ONO was completed 04/21/22-04/22/22 on cpap with 2L . ?Results will be given to Dr. Vaughan Browner to review this afternoon. ?

## 2022-05-02 DIAGNOSIS — Z5181 Encounter for therapeutic drug level monitoring: Secondary | ICD-10-CM | POA: Diagnosis not present

## 2022-05-02 NOTE — Telephone Encounter (Signed)
Overnight oximetry on CPAP and oxygen dated 04/21/2022 ?Duration of study 8 hours 46 minutes ?[place] O2 sat of 90%. ? ?Oxygen saturations remained around 96% for majority of the study ? ?Please let patient know that his oxygen level to maintain at night with CPAP and oxygen.  Continue current therapy. ?

## 2022-05-02 NOTE — Telephone Encounter (Signed)
Called and spoke with patient.  Dr. Mannam's results and recommendations given. Understanding stated.  Nothing further at this time. 

## 2022-05-05 DIAGNOSIS — N401 Enlarged prostate with lower urinary tract symptoms: Secondary | ICD-10-CM | POA: Diagnosis not present

## 2022-05-05 DIAGNOSIS — R339 Retention of urine, unspecified: Secondary | ICD-10-CM | POA: Diagnosis not present

## 2022-05-09 ENCOUNTER — Ambulatory Visit
Admission: RE | Admit: 2022-05-09 | Discharge: 2022-05-09 | Disposition: A | Discharge status: Home / Self Care | Payer: Medicare PPO | Source: Ambulatory Visit | Attending: Pulmonary Disease | Admitting: Pulmonary Disease

## 2022-05-09 DIAGNOSIS — J849 Interstitial pulmonary disease, unspecified: Secondary | ICD-10-CM

## 2022-05-09 DIAGNOSIS — J84112 Idiopathic pulmonary fibrosis: Secondary | ICD-10-CM | POA: Diagnosis not present

## 2022-05-09 DIAGNOSIS — I2510 Atherosclerotic heart disease of native coronary artery without angina pectoris: Secondary | ICD-10-CM | POA: Diagnosis not present

## 2022-05-09 DIAGNOSIS — I712 Thoracic aortic aneurysm, without rupture, unspecified: Secondary | ICD-10-CM | POA: Diagnosis not present

## 2022-05-09 DIAGNOSIS — K449 Diaphragmatic hernia without obstruction or gangrene: Secondary | ICD-10-CM | POA: Diagnosis not present

## 2022-05-13 DIAGNOSIS — Z9981 Dependence on supplemental oxygen: Secondary | ICD-10-CM | POA: Diagnosis not present

## 2022-05-13 DIAGNOSIS — J8410 Pulmonary fibrosis, unspecified: Secondary | ICD-10-CM | POA: Diagnosis not present

## 2022-05-13 DIAGNOSIS — Z0000 Encounter for general adult medical examination without abnormal findings: Secondary | ICD-10-CM | POA: Diagnosis not present

## 2022-05-13 DIAGNOSIS — E538 Deficiency of other specified B group vitamins: Secondary | ICD-10-CM | POA: Diagnosis not present

## 2022-05-13 DIAGNOSIS — J9611 Chronic respiratory failure with hypoxia: Secondary | ICD-10-CM | POA: Diagnosis not present

## 2022-05-13 DIAGNOSIS — Z79899 Other long term (current) drug therapy: Secondary | ICD-10-CM | POA: Diagnosis not present

## 2022-05-13 DIAGNOSIS — G4733 Obstructive sleep apnea (adult) (pediatric): Secondary | ICD-10-CM | POA: Diagnosis not present

## 2022-05-13 DIAGNOSIS — R7302 Impaired glucose tolerance (oral): Secondary | ICD-10-CM | POA: Diagnosis not present

## 2022-05-13 DIAGNOSIS — E782 Mixed hyperlipidemia: Secondary | ICD-10-CM | POA: Diagnosis not present

## 2022-05-19 DIAGNOSIS — N401 Enlarged prostate with lower urinary tract symptoms: Secondary | ICD-10-CM | POA: Diagnosis not present

## 2022-05-19 DIAGNOSIS — R339 Retention of urine, unspecified: Secondary | ICD-10-CM | POA: Diagnosis not present

## 2022-05-26 DIAGNOSIS — E538 Deficiency of other specified B group vitamins: Secondary | ICD-10-CM | POA: Diagnosis not present

## 2022-05-30 IMAGING — CT CT CHEST HIGH RESOLUTION
2 of 7 series · 13 of 36 positions shown, 16 images · non-contrast
Comparison: 05/03/2021 chest CT.

CLINICAL DATA: Follow-up interstitial lung disease.  Former smoker.



[Series 4: chest 2.00 br36 s3 cor soft · coronal · 0.61mm/px · 3 of 171 slices shown]
[im 35/171  lung]
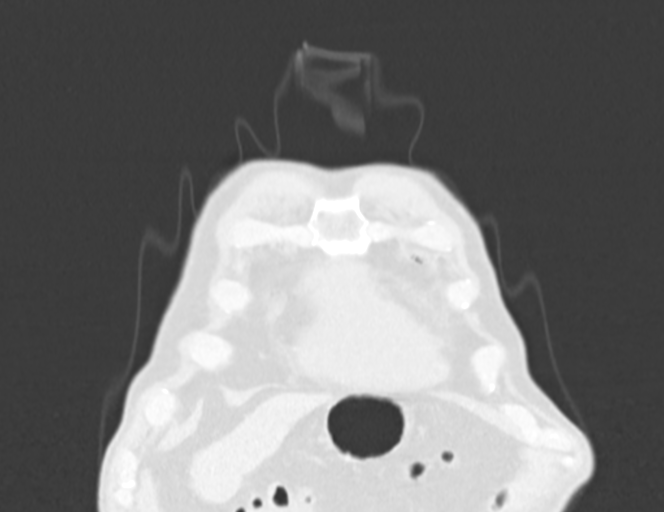
[im 69/171  lung]
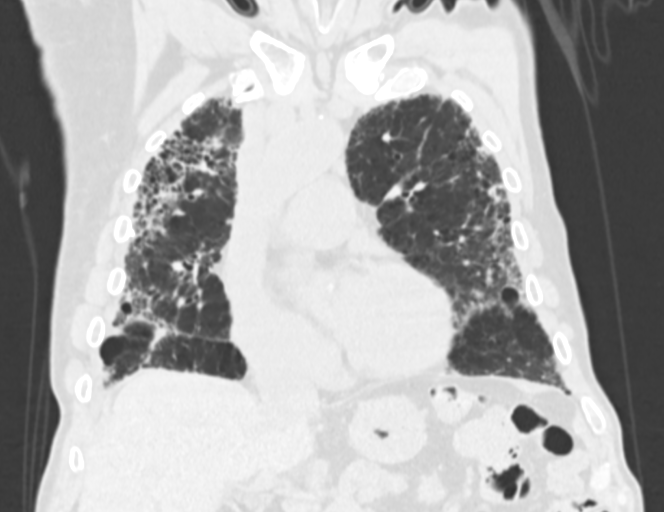
[im 103/171  lung]
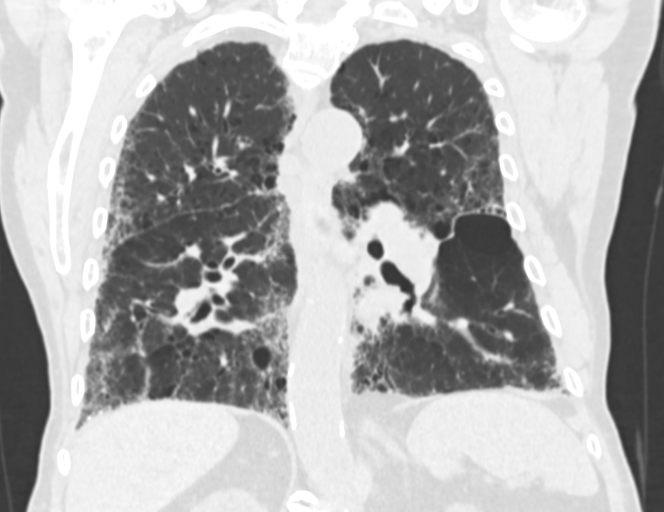

[Series 11: chest 1.00 br60 s3 high res thins 1x1 mm · axial · 0.69mm/px · z∈[+1447,+1709]mm · 10 of 310 slices shown, 13 images]
[im 24/310  mediastinal]
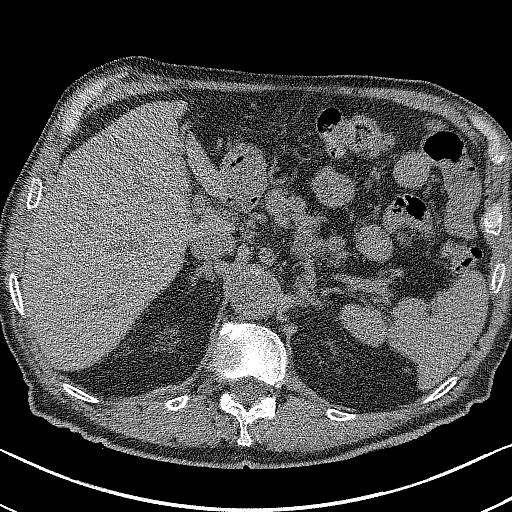
[im 24/310  lung]
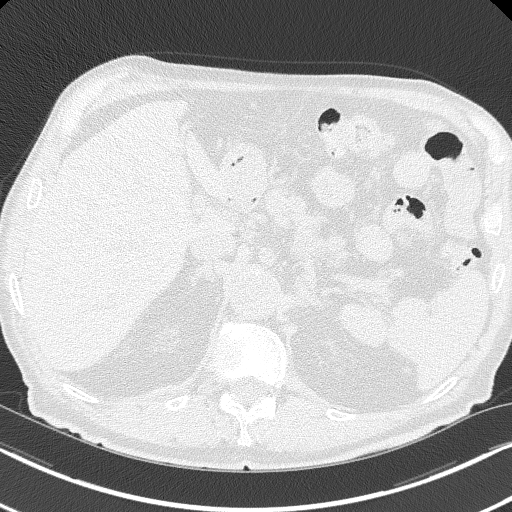
[im 48/310  lung]
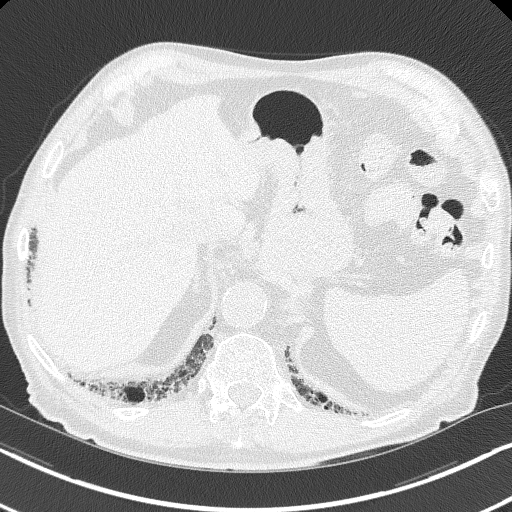
[im 96/310  lung]
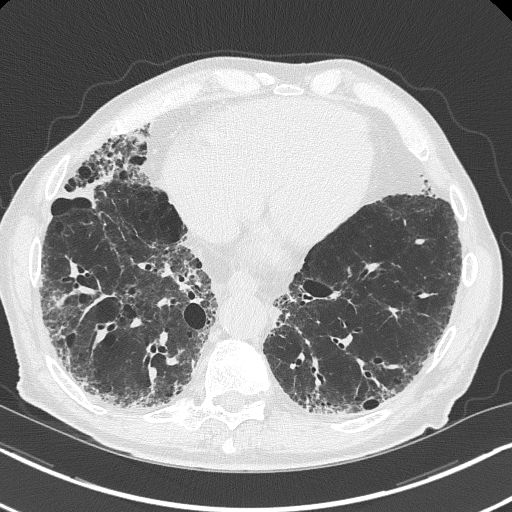
[im 119/310  lung]
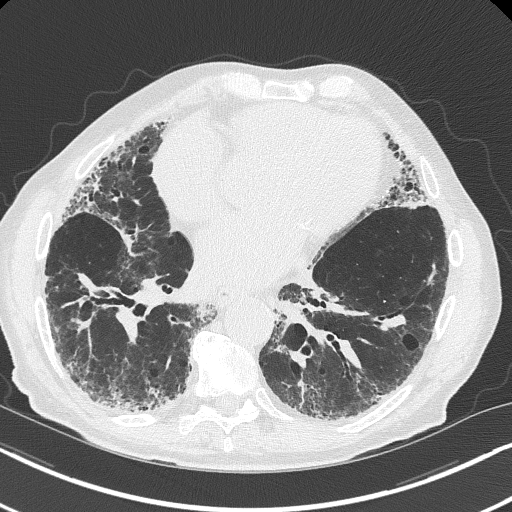
[im 143/310  mediastinal]
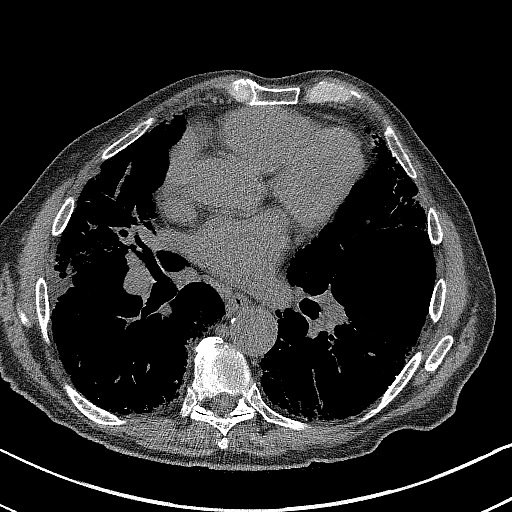
[im 143/310  lung]
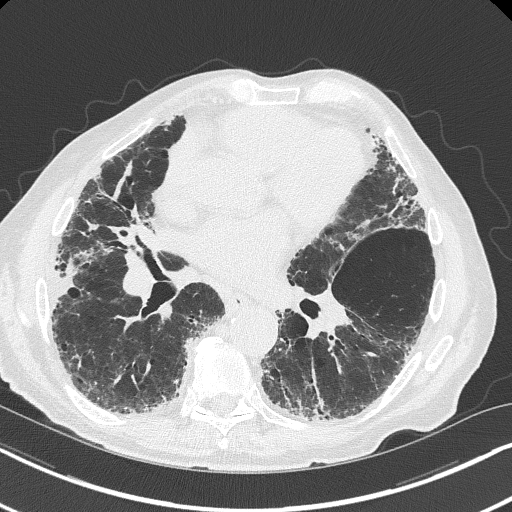
[im 167/310  lung]
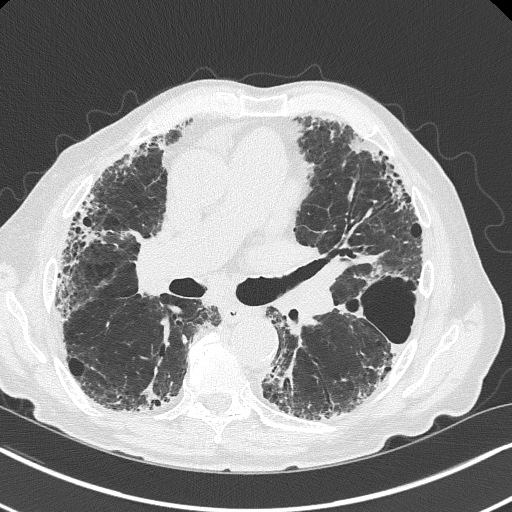
[im 191/310  lung]
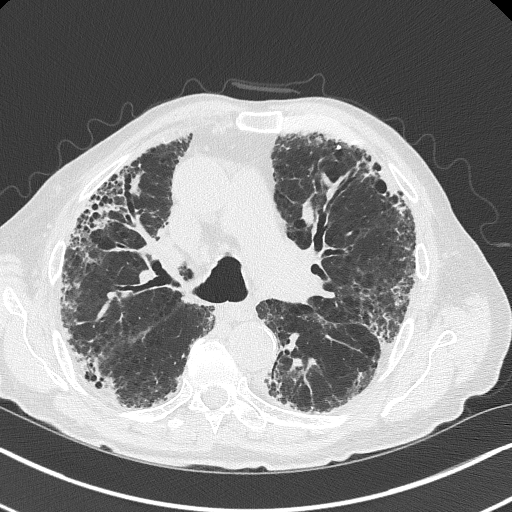
[im 238/310  lung]
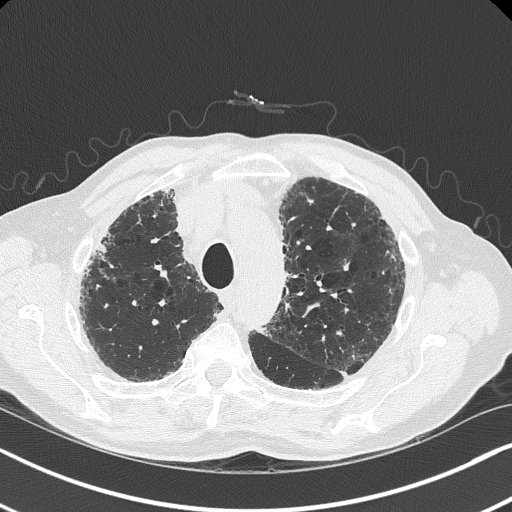
[im 262/310  mediastinal]
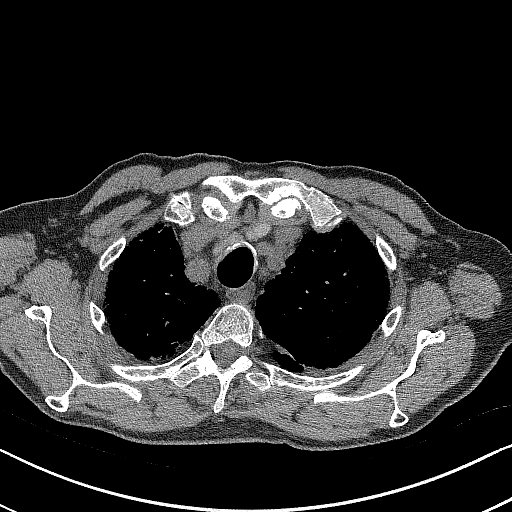
[im 262/310  lung]
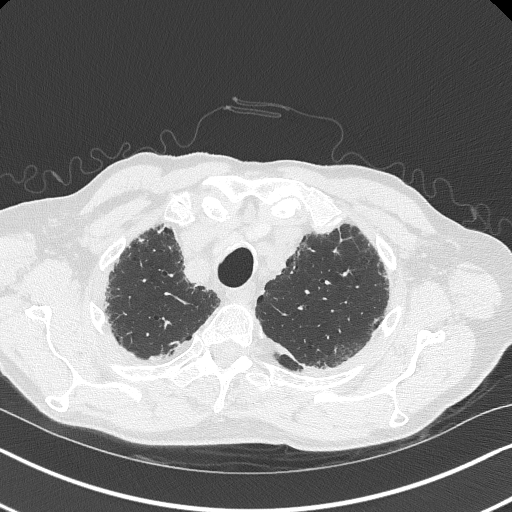
[im 286/310  lung]
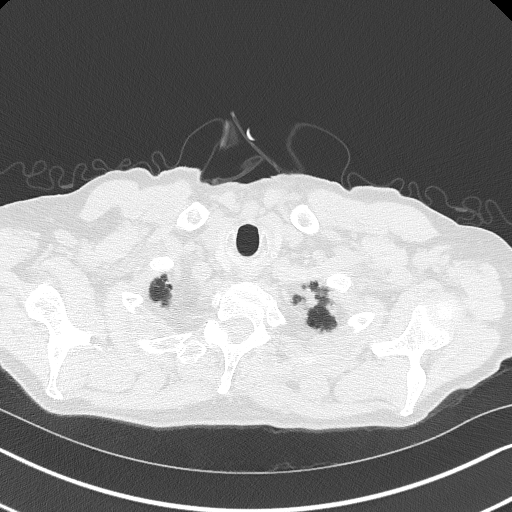

[13 of 36 positions shown; findings below may reference images not displayed]

FINDINGS: Cardiovascular: Top-normal heart size. No significant pericardial
effusion/thickening. Three-vessel coronary atherosclerosis.
Atherosclerotic thoracic aorta with dilated 4.2 cm ascending
thoracic aorta, increased from 3.9 cm using similar measurement
technique. Dilated main pulmonary artery (3.9 cm diameter).

Mediastinum/Nodes: No discrete thyroid nodules. Unremarkable
esophagus. No axillary adenopathy. Mildly enlarged 1.2 cm short axis
diameter subcarinal node (series 2/image 76), stable. No discrete
hilar adenopathy on these noncontrast images.

Lungs/Pleura: No pneumothorax. No pleural effusion. Moderate
paraseptal and centrilobular emphysema. No acute consolidative
airspace disease or lung masses. Indistinct subpleural nodularity in
the anterior left upper lobe is mildly increased, for example
measuring 1.3 x 1.1 cm (series 8/image 64), previously 0.9 x 0.9 cm.
No significant lobular air trapping or evidence of
tracheobronchomalacia on the expiration sequence. Extensive patchy
confluent subpleural reticulation and ground-glass opacity
throughout both lungs with associated moderate traction
bronchiectasis and architectural distortion. Slight basilar
predominance to the findings. Scattered widespread regions of mild
to moderate honeycombing throughout both lungs, most prominent in
the right upper lobe (series 8/image 63). Findings have progressed
in the interval since 05/03/2021 chest CT.

Upper abdomen: Small to moderate hiatal hernia.

Musculoskeletal: No aggressive appearing focal osseous lesions.
Moderate thoracic spondylosis.
IMPRESSION: 1. Spectrum of findings compatible with moderate to severe fibrotic
interstitial lung disease with mild to moderate honeycombing and
slight basilar predominance, progressed in the interval since
05/03/2021 chest CT. Findings are consistent with UIP per consensus
guidelines: Diagnosis of Idiopathic Pulmonary Fibrosis: An Official
ATS/ERS/JRS/ALAT Clinical Practice Guideline. Am J Respir Crit Care
Med Vol 198, Tiger 5, [id number][DATE].
2. Indistinct subpleural nodularity in the anterior left upper lobe
is mildly increased, for example measuring 1.3 x 1.1 cm, previously
0.9 x 0.9 cm. Differential includes nodular fibrosis versus or early
neoplasm. Continued chest CT follow-up suggested in 3-6 months.
PET-CT could be obtained as clinically warranted.
3. Stable mild mediastinal lymphadenopathy, most compatible with
benign reactive adenopathy.
4. Three-vessel coronary atherosclerosis.
5. Dilated 4.2 cm ascending thoracic aorta, increased from 3.9 cm.
Recommend annual imaging followup by CTA or MRA. This recommendation
follows 6535 ACCF/AHA/AATS/ACR/ASA/SCA/ORAZI/ECKER/TEACHA/KAROL Guidelines
for the Diagnosis and Management of Patients with Thoracic Aortic
Disease. Circulation. 6535; 121: E266-e369. Aortic aneurysm NOS
(MWUUM-RHQ.P).
6. Dilated main pulmonary artery, suggesting pulmonary arterial
hypertension.
7. Small to moderate hiatal hernia.
8. Aortic Atherosclerosis (MWUUM-JVW.W) and Emphysema (MWUUM-ND3.6).

## 2022-06-06 DIAGNOSIS — Z5181 Encounter for therapeutic drug level monitoring: Secondary | ICD-10-CM | POA: Diagnosis not present

## 2022-06-06 DIAGNOSIS — H26492 Other secondary cataract, left eye: Secondary | ICD-10-CM | POA: Diagnosis not present

## 2022-06-06 DIAGNOSIS — H04121 Dry eye syndrome of right lacrimal gland: Secondary | ICD-10-CM | POA: Diagnosis not present

## 2022-06-21 DIAGNOSIS — N401 Enlarged prostate with lower urinary tract symptoms: Secondary | ICD-10-CM | POA: Diagnosis not present

## 2022-06-21 DIAGNOSIS — R339 Retention of urine, unspecified: Secondary | ICD-10-CM | POA: Diagnosis not present

## 2022-06-23 DIAGNOSIS — Z87891 Personal history of nicotine dependence: Secondary | ICD-10-CM | POA: Diagnosis not present

## 2022-06-23 DIAGNOSIS — R0602 Shortness of breath: Secondary | ICD-10-CM | POA: Diagnosis not present

## 2022-06-23 DIAGNOSIS — R8281 Pyuria: Secondary | ICD-10-CM | POA: Diagnosis not present

## 2022-06-23 DIAGNOSIS — R531 Weakness: Secondary | ICD-10-CM | POA: Diagnosis not present

## 2022-06-23 DIAGNOSIS — D72829 Elevated white blood cell count, unspecified: Secondary | ICD-10-CM | POA: Diagnosis not present

## 2022-06-23 DIAGNOSIS — I10 Essential (primary) hypertension: Secondary | ICD-10-CM | POA: Diagnosis not present

## 2022-06-23 DIAGNOSIS — J439 Emphysema, unspecified: Secondary | ICD-10-CM | POA: Diagnosis not present

## 2022-06-23 DIAGNOSIS — Z20822 Contact with and (suspected) exposure to covid-19: Secondary | ICD-10-CM | POA: Diagnosis not present

## 2022-06-27 DIAGNOSIS — E538 Deficiency of other specified B group vitamins: Secondary | ICD-10-CM | POA: Diagnosis not present

## 2022-06-30 DIAGNOSIS — N401 Enlarged prostate with lower urinary tract symptoms: Secondary | ICD-10-CM | POA: Diagnosis not present

## 2022-06-30 DIAGNOSIS — R339 Retention of urine, unspecified: Secondary | ICD-10-CM | POA: Diagnosis not present

## 2022-07-01 ENCOUNTER — Telehealth: Payer: Self-pay | Admitting: Pulmonary Disease

## 2022-07-01 DIAGNOSIS — J84112 Idiopathic pulmonary fibrosis: Secondary | ICD-10-CM

## 2022-07-01 DIAGNOSIS — J849 Interstitial pulmonary disease, unspecified: Secondary | ICD-10-CM

## 2022-07-01 NOTE — Telephone Encounter (Signed)
Spoke with the pt  He states having diarrhea and decreased appetite for the past 2 wks  He is getting over UTI  He states that he feels tired some days  Has diarrhea a few times per wk, not a daily issue  He is taking OFEV 150 mg bid with meals  Immodium helps  Pt feels that this is all coming from [facility]  He is aware that Dr Vaughan Browner not back in clinic until 7/11 and okay waiting for response then  He will call sooner if needed  Please advise, thanks!

## 2022-07-05 DIAGNOSIS — Z5181 Encounter for therapeutic drug level monitoring: Secondary | ICD-10-CM | POA: Diagnosis not present

## 2022-07-05 NOTE — Telephone Encounter (Signed)
Please have him discontinue Ofev for 2 weeks to get a break from the medication We can resume at a lower dose of 100 mg twice daily

## 2022-07-05 NOTE — Telephone Encounter (Signed)
Routing back to Dr.Mannam. please advise.

## 2022-07-05 NOTE — Telephone Encounter (Signed)
Called and spoke with pt letting him know the info per Dr. Vaughan Browner and he verbalized understanding. Stated to him that pharmacy team will take care of sending lower dose of OFEV '100mg'$  to pharmacy for him. Routing back to pharmacy team so they can get the Rx taken care of.

## 2022-07-06 MED ORDER — OFEV 100 MG PO CAPS: 100.0000 mg | ORAL_CAPSULE | Freq: Two times a day (BID) | ORAL | 0 refills | Status: DC

## 2022-07-06 NOTE — Telephone Encounter (Signed)
Prescription for Ofev '100mg'$  twice daily sent to [facility].  Knox Saliva, PharmD, MPH, BCPS, CPP Clinical Pharmacist (Rheumatology and Pulmonology)

## 2022-07-22 ENCOUNTER — Ambulatory Visit (INDEPENDENT_AMBULATORY_CARE_PROVIDER_SITE_OTHER): Payer: Medicare PPO | Admitting: Pulmonary Disease

## 2022-07-22 ENCOUNTER — Encounter: Payer: Self-pay | Admitting: Pulmonary Disease

## 2022-07-22 ENCOUNTER — Ambulatory Visit: Payer: Medicare PPO | Admitting: Pulmonary Disease

## 2022-07-22 VITALS — BP 128/64 | HR 89 | Temp 97.6°F | Ht 67.5 in | Wt 166.0 lb

## 2022-07-22 DIAGNOSIS — Z5181 Encounter for therapeutic drug level monitoring: Secondary | ICD-10-CM | POA: Diagnosis not present

## 2022-07-22 DIAGNOSIS — J84112 Idiopathic pulmonary fibrosis: Secondary | ICD-10-CM

## 2022-07-22 DIAGNOSIS — J849 Interstitial pulmonary disease, unspecified: Secondary | ICD-10-CM | POA: Diagnosis not present

## 2022-07-22 LAB — PULMONARY FUNCTION TEST
DL/VA % pred: 62 %
DL/VA: 2.46 ml/min/mmHg/L
DLCO cor % pred: 45 %
DLCO cor: 10.29 ml/min/mmHg
DLCO unc % pred: 45 %
DLCO unc: 10.29 ml/min/mmHg
FEF 25-75 Post: 4.21 L/sec
FEF 25-75 Pre: 4.33 L/sec
FEF2575-%Change-Post: -2 %
FEF2575-%Pred-Post: 248 %
FEF2575-%Pred-Pre: 255 %
FEV1-%Change-Post: 1 %
FEV1-%Pred-Post: 117 %
FEV1-%Pred-Pre: 116 %
FEV1-Post: 2.96 L
FEV1-Pre: 2.93 L
FEV1FVC-%Change-Post: 0 %
FEV1FVC-%Pred-Pre: 121 %
FEV6-%Change-Post: 0 %
FEV6-%Pred-Post: 102 %
FEV6-%Pred-Pre: 101 %
FEV6-Post: 3.38 L
FEV6-Pre: 3.37 L
FEV6FVC-%Pred-Post: 107 %
FEV6FVC-%Pred-Pre: 107 %
FVC-%Change-Post: 0 %
FVC-%Pred-Post: 94 %
FVC-%Pred-Pre: 94 %
FVC-Post: 3.38 L
FVC-Pre: 3.37 L
Post FEV1/FVC ratio: 88 %
Post FEV6/FVC ratio: 100 %
Pre FEV1/FVC ratio: 87 %
Pre FEV6/FVC Ratio: 100 %
RV % pred: 58 %
RV: 1.49 L
TLC % pred: 80 %
TLC: 5.29 L

## 2022-07-22 NOTE — Patient Instructions (Signed)
Full PFT performed today. °

## 2022-07-22 NOTE — Progress Notes (Signed)
Full PFT performed today. °

## 2022-07-22 NOTE — Patient Instructions (Addendum)
Continue the Ofev at current dose.  Monitor for diarrhea Follow-up in 3 months

## 2022-07-22 NOTE — Progress Notes (Addendum)
Vincent Wiley    [id number]    30-May-1941  Primary Care Physician:Street, Sharon Mt, MD  Referring Physician: Venetia Maxon, Sharon Mt, MD [address] [place],  [place] [address]  Chief complaint: Follow-up for IPF.  HPI: 81 year old with history of coronary artery disease, paroxysmal atrial fibrillation, hypertension, dyslipidemia. Follows with Dr. Geraldo Pitter, cardiology.  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  No feather pillows or comforters Smoking history: 26-pack-year smoker.  Quit in 1984 Travel history: No significant travel history Relevant family history: No family history of lung disease  Interim history: Started on Esbriet in October 2022.  This has been poorly tolerated with weight loss, loss of appetite, fatigue Started Ofev in February 2023.  Dose reduced to 100 mg twice daily in July 2023 due to diarrhea.   Outpatient Encounter Medications as of 07/22/2022  Medication Sig   alfuzosin (UROXATRAL) 10 MG 24 hr tablet Take 10 mg by mouth daily with breakfast.   amiodarone (PACERONE) 200 MG tablet Take 1 tablet (200 mg total) by mouth daily.   apixaban (ELIQUIS) 5 MG TABS tablet Take 1 tablet (5 mg total) by mouth 2 (two) times daily.   bethanechol (URECHOLINE) 50 MG tablet Take by mouth daily.   carvedilol (COREG) 3.125 MG tablet Take 3.125 mg by mouth 2 (two) times daily.   Cholecalciferol (D3 2000) 50 MCG (2000 UT) CAPS Take 2,000 Units by mouth daily at 12 noon.   Coenzyme Q10 (COQ10) 100 MG CAPS Take 100 mg by mouth daily.   fluticasone (FLONASE) 50 MCG/ACT  nasal spray Place 1 spray into both nostrils 2 (two) times daily.   Multiple Vitamins-Minerals (VITAMIN D3 COMPLETE PO) Take 1 tablet by mouth daily.   Nintedanib (OFEV) 100 MG CAPS Take 1 capsule (100 mg total) by mouth 2 (two) times daily. ** note dose reduction **   pantoprazole (PROTONIX) 40 MG tablet Take 40 mg by mouth 2 (two) times daily.   Probiotic Product (PROBIOTIC DAILY PO) Take 1 tablet by mouth daily.   simvastatin (ZOCOR) 20 MG tablet Take 20 mg by mouth every evening.   sucralfate (CARAFATE) 1 GM/10ML suspension Take 1 g by mouth 2 (two) times daily.   telmisartan (MICARDIS) 20 MG tablet Take 10 mg by mouth daily.   valACYclovir (VALTREX) 500 MG tablet Take 500 mg by mouth 2 (two) times daily as needed for other (Fever blisters). Fever blisters   Fluticasone-Umeclidin-Vilant (TRELEGY ELLIPTA) 200-62.5-25 MCG/ACT AEPB Inhale 1 puff into the lungs daily. (Patient not taking: Reported on 07/22/2022)   No facility-administered encounter medications on file as of 07/22/2022.    Physical Exam: Blood pressure 128/64, pulse 89, temperature 97.6 F (36.4 C), temperature source Oral, height 5' 7.5" (1.715 m), weight 166 lb (75.3 kg), SpO2 94 %. Gen:      No acute distress HEENT:  EOMI, sclera anicteric Neck:     No masses; no thyromegaly Lungs:    Bibasal Crackles CV:         Regular rate and rhythm; no murmurs Abd:      + bowel  sounds; soft, non-tender; no palpable masses, no distension Ext:    No edema; adequate peripheral perfusion Skin:      Warm and dry; no rash Neuro: alert and oriented x 3 Psych: normal mood and affect   Data Reviewed: Imaging: High-res CT 03/28/2021-pulmonary fibrosis in UIP pattern.  Honeycombing present, bronchial wall thickening, emphysema, coronary artery disease, aortic aneurysm, enlargement of pulmonary artery  CT chest 05/03/2021-redemonstrated pulmonary fibrosis  Chest x-ray 02/04/2022-stable pulmonary fibrosis with no acute abnormality. I have  reviewed the images personally.  Cardiac: Echocardiogram 04/03/2019- LVEF 60 to 65%, mild concentric LVH, grade 3 diastolic dysfunction, mildly elevated PA systolic pressure, ASD  PFTs: 09/03/2021 FVC 2.70 [102%], FEV1 3.22 [126%], F/F 87, TLC 5.21 [79%], DLCO 13.51 [59%] Severe diffusion defect  07/22/2022 FVC 3.28 [94%], FEV1 2.96 [117%], F/F 88, TLC 5.29 [80%], DLCO 10.29 [45%] Severe diffusion defect  Labs: CTD serologies 09/03/2021- negative  Hepatic panel from primary care dated 07/06/2022 Within normal limits except for low albumin of 3.3  Sleep: Overnight oximetry on CPAP and oxygen dated 04/21/2022 Duration of study 8 hours 46 minutes Nadir O2 sat of 90%.  Oxygen saturations remained around 96% for majority of the study  Assessment:  Pulmonary fibrosis, IPF I have reviewed prior CT scans and typical UIP pattern with honeycombing.  This is likely to be idiopathic pulmonary fibrosis in the absence of any exposures or signs and symptoms of connective tissue disease.  CTD serologies are negative  Discussed findings and diagnosis with patient No need for biopsy as he diagnosis of IPF is fairly certain Esbriet is poorly tolerated and he is now on Ofev He had labs recently at primary care which are within normal limits Pulmonary rehab  Pulmonary hypertension He has mild elevation in PA systolic pressure on echocardiogram This is likely secondary to left heart disease with diastolic dysfunction and OSA May need right heart cath later  Obstructive sleep apnea Compliant with CPAP and 2 L oxygen at night.  Follows with Dr. Maxwell Caul Reports increased dyspnea when he wakes up.  Check overnight oximetry on CPAP and supplemental oxygen  Plan/Recommendations: Continue Ofev at reduced dose Monitor labs  [name] MD [facility] 07/22/2022, 3:14 PM  CC: Street, Sharon Mt, *   Addendum: Labs from primary care 10/03/2022 reviewed Hepatic panel  significant only for alk phos of 37.

## 2022-07-26 DIAGNOSIS — E538 Deficiency of other specified B group vitamins: Secondary | ICD-10-CM | POA: Diagnosis not present

## 2022-07-29 DIAGNOSIS — R1010 Upper abdominal pain, unspecified: Secondary | ICD-10-CM | POA: Diagnosis not present

## 2022-08-02 DIAGNOSIS — Z5181 Encounter for therapeutic drug level monitoring: Secondary | ICD-10-CM | POA: Diagnosis not present

## 2022-08-04 DIAGNOSIS — N401 Enlarged prostate with lower urinary tract symptoms: Secondary | ICD-10-CM | POA: Diagnosis not present

## 2022-08-04 DIAGNOSIS — R339 Retention of urine, unspecified: Secondary | ICD-10-CM | POA: Diagnosis not present

## 2022-08-08 DIAGNOSIS — L570 Actinic keratosis: Secondary | ICD-10-CM | POA: Diagnosis not present

## 2022-08-12 DIAGNOSIS — E860 Dehydration: Secondary | ICD-10-CM | POA: Diagnosis not present

## 2022-08-25 DIAGNOSIS — E538 Deficiency of other specified B group vitamins: Secondary | ICD-10-CM | POA: Diagnosis not present

## 2022-09-02 DIAGNOSIS — Z5181 Encounter for therapeutic drug level monitoring: Secondary | ICD-10-CM | POA: Diagnosis not present

## 2022-09-26 DIAGNOSIS — D519 Vitamin B12 deficiency anemia, unspecified: Secondary | ICD-10-CM | POA: Diagnosis not present

## 2022-09-26 DIAGNOSIS — Z23 Encounter for immunization: Secondary | ICD-10-CM | POA: Diagnosis not present

## 2022-09-30 DIAGNOSIS — D500 Iron deficiency anemia secondary to blood loss (chronic): Secondary | ICD-10-CM | POA: Diagnosis not present

## 2022-10-03 DIAGNOSIS — Z5181 Encounter for therapeutic drug level monitoring: Secondary | ICD-10-CM | POA: Diagnosis not present

## 2022-10-05 DIAGNOSIS — Z7901 Long term (current) use of anticoagulants: Secondary | ICD-10-CM | POA: Diagnosis not present

## 2022-10-05 DIAGNOSIS — K219 Gastro-esophageal reflux disease without esophagitis: Secondary | ICD-10-CM | POA: Diagnosis not present

## 2022-10-05 DIAGNOSIS — D500 Iron deficiency anemia secondary to blood loss (chronic): Secondary | ICD-10-CM | POA: Diagnosis not present

## 2022-10-05 DIAGNOSIS — K2971 Gastritis, unspecified, with bleeding: Secondary | ICD-10-CM | POA: Diagnosis not present

## 2022-10-05 DIAGNOSIS — R195 Other fecal abnormalities: Secondary | ICD-10-CM | POA: Diagnosis not present

## 2022-10-06 DIAGNOSIS — R339 Retention of urine, unspecified: Secondary | ICD-10-CM | POA: Diagnosis not present

## 2022-10-06 DIAGNOSIS — N401 Enlarged prostate with lower urinary tract symptoms: Secondary | ICD-10-CM | POA: Diagnosis not present

## 2022-10-14 ENCOUNTER — Telehealth: Payer: Self-pay | Admitting: Pharmacist

## 2022-10-14 ENCOUNTER — Telehealth: Payer: Self-pay | Admitting: Pulmonary Disease

## 2022-10-14 DIAGNOSIS — J849 Interstitial pulmonary disease, unspecified: Secondary | ICD-10-CM

## 2022-10-14 DIAGNOSIS — J84112 Idiopathic pulmonary fibrosis: Secondary | ICD-10-CM

## 2022-10-14 MED ORDER — OFEV 100 MG PO CAPS: 100.0000 mg | ORAL_CAPSULE | Freq: Two times a day (BID) | ORAL | 0 refills | Status: DC

## 2022-10-14 NOTE — Telephone Encounter (Signed)
Received fax from [facility] regarding LFTs that patient completed on 10/03/22  WNL to continue Ofev 124m twice daily as prescribed.  Tbili - 0.3 mg/dL Dbil 0.1 mg/dL Alk phos 37 (L) AAST 13 U/L ALT 9 U/L Albumin 3.8 g/dL TP 6.7 g/dL Globulin 2.9 g/dL A/G 1.3 ratio  OV with Dr. MVaughan Browneron 10/17/22. Labs sent to media tab of chart  DKnox Saliva PharmD, MPH, BCPS, CPP Clinical Pharmacist (Rheumatology and Pulmonology)

## 2022-10-14 NOTE — Telephone Encounter (Signed)
Patient returned call. States he had labs completed earlier this month with [facility]. We did not receive these labs. He states he will run out of Ofev on Sunday  Rx sent to pharmacy today for Ofev '100mg'$  twice daily  VM left with PCP office, CMA Brooke requesting fax of labwork  Knox Saliva, PharmD, MPH, BCPS, CPP Clinical Pharmacist (Rheumatology and Pulmonology)

## 2022-10-14 NOTE — Telephone Encounter (Signed)
Refill for Ofev will be sent after visit on 10/17/22  Dose: 100 mg twice daily  Last OV: 07/22/22 Provider: Dr. Vaughan Browner  Next OV: 10/17/22  LFTs on 07/05/22 wnl  Jennye Boroughs Wilhemina Bonito, PharmD, MPH, BCPS Clinical Pharmacist (Rheumatology and Pulmonology)

## 2022-10-17 ENCOUNTER — Encounter: Payer: Self-pay | Admitting: Pulmonary Disease

## 2022-10-17 ENCOUNTER — Ambulatory Visit (INDEPENDENT_AMBULATORY_CARE_PROVIDER_SITE_OTHER): Payer: Medicare PPO | Admitting: Pulmonary Disease

## 2022-10-17 VITALS — BP 140/74 | HR 71 | Temp 97.9°F | Ht 69.0 in | Wt 167.0 lb

## 2022-10-17 DIAGNOSIS — J84112 Idiopathic pulmonary fibrosis: Secondary | ICD-10-CM | POA: Diagnosis not present

## 2022-10-17 DIAGNOSIS — Z5181 Encounter for therapeutic drug level monitoring: Secondary | ICD-10-CM | POA: Diagnosis not present

## 2022-10-17 NOTE — Progress Notes (Addendum)
Vincent Wiley    [id number]    08/01/1941  Primary Care Physician:Street, Sharon Mt, MD  Referring Physician: Venetia Maxon, Sharon Mt, MD [address] [place],  [place] [address]  Chief complaint: Follow-up for IPF.  HPI: 81 y.o.  with history of coronary artery disease, paroxysmal atrial fibrillation, hypertension, dyslipidemia. Follows with Dr. Geraldo Pitter, cardiology.  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  No feather pillows or comforters Smoking history: 26-pack-year smoker.  Quit in 1984 Travel history: No significant travel history Relevant family history: No family history of lung disease  Interim history: Started on Esbriet in October 2022.  This has been poorly tolerated with weight loss, loss of appetite, fatigue Started Ofev in February 2023.  Dose reduced to 100 mg twice daily in July 2023 due to diarrhea.  He is tolerating the low-dose well without any issues. Recent hepatic panel at primary care was okay.  Outpatient Encounter Medications as of 10/17/2022  Medication Sig   alfuzosin (UROXATRAL) 10 MG 24 hr tablet Take 10 mg by mouth daily with breakfast.   amiodarone (PACERONE) 200 MG tablet Take 1 tablet (200 mg total) by mouth daily. (Patient taking differently: Take 100 mg by mouth daily. Taking 19m daily)   apixaban (ELIQUIS) 5 MG TABS tablet Take 1 tablet (5 mg total) by mouth 2 (two) times daily.   bethanechol (URECHOLINE) 50 MG tablet Take by mouth daily.   carvedilol (COREG) 3.125 MG tablet Take 3.125 mg by mouth 2 (two) times daily.   Cholecalciferol  (D3 2000) 50 MCG (2000 UT) CAPS Take 2,000 Units by mouth daily at 12 noon.   Coenzyme Q10 (COQ10) 100 MG CAPS Take 100 mg by mouth daily.   fluticasone (FLONASE) 50 MCG/ACT nasal spray Place 1 spray into both nostrils 2 (two) times daily.   Multiple Vitamins-Minerals (VITAMIN D3 COMPLETE PO) Take 1 tablet by mouth daily.   Nintedanib (OFEV) 100 MG CAPS Take 1 capsule (100 mg total) by mouth 2 (two) times daily. ** note dose reduction **   pantoprazole (PROTONIX) 40 MG tablet Take 40 mg by mouth 2 (two) times daily.   Probiotic Product (PROBIOTIC DAILY PO) Take 1 tablet by mouth daily.   simvastatin (ZOCOR) 20 MG tablet Take 20 mg by mouth every evening.   sucralfate (CARAFATE) 1 GM/10ML suspension Take 1 g by mouth 2 (two) times daily.   telmisartan (MICARDIS) 20 MG tablet Take 10 mg by mouth daily.   valACYclovir (VALTREX) 500 MG tablet Take 500 mg by mouth 2 (two) times daily as needed for other (Fever blisters). Fever blisters   [DISCONTINUED] Fluticasone-Umeclidin-Vilant (TRELEGY ELLIPTA) 200-62.5-25 MCG/ACT AEPB Inhale 1 puff into the lungs daily. (Patient not taking: Reported on 07/22/2022)   No facility-administered encounter medications on file as of 10/17/2022.    Physical Exam: Blood pressure (!) 140/74, pulse 71, temperature 97.9 F (36.6 C), temperature source Oral, height 5' 9"  (1.753 m), weight 167 lb (75.8 kg), SpO2 96 %. Gen:      No acute distress HEENT:  EOMI, sclera anicteric Neck:  No masses; no thyromegaly Lungs:    Clear to auscultation bilaterally; normal respiratory effort CV:         Regular rate and rhythm; no murmurs Abd:      + bowel sounds; soft, non-tender; no palpable masses, no distension Ext:    No edema; adequate peripheral perfusion Skin:      Warm and dry; no rash Neuro: alert and oriented x 3 Psych: normal mood and affect   Data Reviewed: Imaging: High-res CT 03/28/2021-pulmonary fibrosis in UIP pattern.  Honeycombing present, bronchial wall  thickening, emphysema, coronary artery disease, aortic aneurysm, enlargement of pulmonary artery  CT chest 05/03/2021-redemonstrated pulmonary fibrosis  High-resolution CT 05/09/2022-UIP pulmonary fibrosis with progression. I have reviewed the images personally.  Cardiac: Echocardiogram 04/03/2019- LVEF 60 to 65%, mild concentric LVH, grade 3 diastolic dysfunction, mildly elevated PA systolic pressure, ASD  PFTs: 09/03/2021 FVC 2.70 [102%], FEV1 3.22 [126%], F/F 87, TLC 5.21 [79%], DLCO 13.51 [59%] Severe diffusion defect  07/22/2022 FVC 3.28 [94%], FEV1 2.96 [117%], F/F 88, TLC 5.29 [80%], DLCO 10.29 [45%] Severe diffusion defect  Labs: CTD serologies 09/03/2021- negative  Hepatic panel from primary care dated 07/06/2022 Within normal limits except for low albumin of 3.3  Labs from primary care 10/03/2022 reviewed Hepatic panel significant only for alk phos of 37.  Sleep: Overnight oximetry on CPAP and oxygen dated 04/21/2022 Duration of study 8 hours 46 minutes Nadir O2 sat of 90%.  Oxygen saturations remained around 96% for majority of the study  Assessment:  Pulmonary fibrosis, IPF I have reviewed prior CT scans and typical UIP pattern with honeycombing.  This is likely to be idiopathic pulmonary fibrosis in the absence of any exposures or signs and symptoms of connective tissue disease.  CTD serologies are negative  Discussed findings and diagnosis with patient No need for biopsy as he diagnosis of IPF is fairly certain Esbriet is poorly tolerated and he is now on Ofev He had labs recently at primary care which are within normal limits Finished pulmonary rehab  Pulmonary hypertension He has mild elevation in PA systolic pressure on echocardiogram This is likely secondary to left heart disease with diastolic dysfunction and OSA Follow an echocardiogram.  May need right heart cath later  Obstructive sleep apnea Compliant with CPAP and 2 L oxygen at night.  Follows with Dr.  Maxwell Caul Reports increased dyspnea when he wakes up.  Check overnight oximetry on CPAP and supplemental oxygen  Plan/Recommendations: Continue Ofev at reduced dose Monitor labs  [name] MD [facility] 10/17/2022, 1:22 PM  CC: Street, Sharon Mt, *  Addendum: Received hepatic panel from primary care dated 11/09/2022 Alk phos is borderline low at 37, rest of hepatic panel is within normal limits.

## 2022-10-17 NOTE — Patient Instructions (Signed)
I am glad you are doing well with your breathing Your liver tests are stable Return to clinic in 4 months.

## 2022-10-17 NOTE — Telephone Encounter (Signed)
Labs from 10/03/22 received and reviewed. Refill of Ofev was sent for 90 day supply to [facility] already  Knox Saliva, PharmD, MPH, BCPS, CPP Clinical Pharmacist (Rheumatology and Pulmonology)

## 2022-10-26 DIAGNOSIS — E538 Deficiency of other specified B group vitamins: Secondary | ICD-10-CM | POA: Diagnosis not present

## 2022-11-24 DIAGNOSIS — E538 Deficiency of other specified B group vitamins: Secondary | ICD-10-CM | POA: Diagnosis not present

## 2022-11-27 DIAGNOSIS — R319 Hematuria, unspecified: Secondary | ICD-10-CM | POA: Diagnosis not present

## 2022-11-27 DIAGNOSIS — N3289 Other specified disorders of bladder: Secondary | ICD-10-CM | POA: Diagnosis not present

## 2022-11-27 DIAGNOSIS — N281 Cyst of kidney, acquired: Secondary | ICD-10-CM | POA: Diagnosis not present

## 2022-11-27 DIAGNOSIS — N3001 Acute cystitis with hematuria: Secondary | ICD-10-CM | POA: Diagnosis not present

## 2022-11-27 DIAGNOSIS — N400 Enlarged prostate without lower urinary tract symptoms: Secondary | ICD-10-CM | POA: Diagnosis not present

## 2022-11-29 DIAGNOSIS — R310 Gross hematuria: Secondary | ICD-10-CM | POA: Diagnosis not present

## 2022-11-29 DIAGNOSIS — R339 Retention of urine, unspecified: Secondary | ICD-10-CM | POA: Diagnosis not present

## 2022-11-29 DIAGNOSIS — N401 Enlarged prostate with lower urinary tract symptoms: Secondary | ICD-10-CM | POA: Diagnosis not present

## 2022-12-13 ENCOUNTER — Other Ambulatory Visit: Payer: Self-pay | Admitting: Pharmacist

## 2022-12-13 DIAGNOSIS — J84112 Idiopathic pulmonary fibrosis: Secondary | ICD-10-CM

## 2022-12-13 DIAGNOSIS — J849 Interstitial pulmonary disease, unspecified: Secondary | ICD-10-CM

## 2022-12-13 MED ORDER — OFEV 100 MG PO CAPS: 100.0000 mg | ORAL_CAPSULE | Freq: Two times a day (BID) | ORAL | 1 refills | Status: DC

## 2022-12-13 NOTE — Telephone Encounter (Signed)
Refill sent for OFEV to [facility] (pulmonary fibrosis team). [phone]  Dose: 100 mg twice daily  Last OV: 10/17/22 Provider: Dr. Vaughan Browner  Next OV: 4 months (not yet scheduled)  Received hepatic panel 10/03/22 wnl  Knox Saliva, PharmD, MPH, BCPS Clinical Pharmacist (Rheumatology and Pulmonology)

## 2022-12-20 ENCOUNTER — Telehealth: Payer: Self-pay | Admitting: Pulmonary Disease

## 2022-12-20 NOTE — Telephone Encounter (Signed)
Refill was already sent on 12/13/2022 for 6 month supply.   ATC patient to advise. Unable to reach. lEft VM with pharmacy phone number and advised that this was sent on 12/13/2022 and he will have to contact pharmacy to set up shipment.  Knox Saliva, PharmD, MPH, BCPS, CPP Clinical Pharmacist (Rheumatology and Pulmonology)

## 2022-12-27 DIAGNOSIS — E538 Deficiency of other specified B group vitamins: Secondary | ICD-10-CM | POA: Diagnosis not present

## 2022-12-28 DIAGNOSIS — R339 Retention of urine, unspecified: Secondary | ICD-10-CM | POA: Diagnosis not present

## 2022-12-28 DIAGNOSIS — N401 Enlarged prostate with lower urinary tract symptoms: Secondary | ICD-10-CM | POA: Diagnosis not present

## 2023-01-02 DIAGNOSIS — G4733 Obstructive sleep apnea (adult) (pediatric): Secondary | ICD-10-CM | POA: Diagnosis not present

## 2023-01-05 DIAGNOSIS — R972 Elevated prostate specific antigen [PSA]: Secondary | ICD-10-CM | POA: Diagnosis not present

## 2023-01-05 DIAGNOSIS — N401 Enlarged prostate with lower urinary tract symptoms: Secondary | ICD-10-CM | POA: Diagnosis not present

## 2023-01-05 DIAGNOSIS — R339 Retention of urine, unspecified: Secondary | ICD-10-CM | POA: Diagnosis not present

## 2023-01-19 ENCOUNTER — Other Ambulatory Visit: Payer: Self-pay

## 2023-01-19 DIAGNOSIS — N401 Enlarged prostate with lower urinary tract symptoms: Secondary | ICD-10-CM | POA: Insufficient documentation

## 2023-01-19 DIAGNOSIS — R338 Other retention of urine: Secondary | ICD-10-CM | POA: Insufficient documentation

## 2023-01-19 HISTORY — DX: Benign prostatic hyperplasia with lower urinary tract symptoms: N40.1

## 2023-01-19 HISTORY — DX: Other retention of urine: R33.8

## 2023-01-25 ENCOUNTER — Ambulatory Visit: Payer: Medicare PPO | Attending: Cardiology | Admitting: Cardiology

## 2023-01-25 ENCOUNTER — Encounter: Payer: Self-pay | Admitting: Cardiology

## 2023-01-25 VITALS — BP 132/70 | HR 75 | Ht 69.0 in | Wt 169.8 lb

## 2023-01-25 DIAGNOSIS — I2510 Atherosclerotic heart disease of native coronary artery without angina pectoris: Secondary | ICD-10-CM

## 2023-01-25 DIAGNOSIS — I10 Essential (primary) hypertension: Secondary | ICD-10-CM

## 2023-01-25 DIAGNOSIS — G4733 Obstructive sleep apnea (adult) (pediatric): Secondary | ICD-10-CM | POA: Diagnosis not present

## 2023-01-25 DIAGNOSIS — Q2111 Secundum atrial septal defect: Secondary | ICD-10-CM | POA: Diagnosis not present

## 2023-01-25 DIAGNOSIS — I7121 Aneurysm of the ascending aorta, without rupture: Secondary | ICD-10-CM | POA: Diagnosis not present

## 2023-01-25 HISTORY — DX: Secundum atrial septal defect: Q21.11

## 2023-01-25 MED ORDER — APIXABAN 5 MG PO TABS: 5.0000 mg | ORAL_TABLET | Freq: Two times a day (BID) | ORAL | 3 refills | Status: DC

## 2023-01-25 MED ORDER — AMIODARONE HCL 200 MG PO TABS: 100.0000 mg | ORAL_TABLET | Freq: Every day | ORAL | 3 refills | Status: DC

## 2023-01-25 NOTE — Patient Instructions (Signed)
Medication Instructions:  Your physician recommends that you continue on your current medications as directed. Please refer to the Current Medication list given to you today.  *If you need a refill on your cardiac medications before your next appointment, please call your pharmacy*   Lab Work: Your physician recommends that you return for lab work in: Today for a BMP, TSH, Liver Function and Lipid Panel  If you have labs (blood work) drawn today and your tests are completely normal, you will receive your results only by: MyChart Message (if you have MyChart) OR A paper copy in the mail If you have any lab test that is abnormal or we need to change your treatment, we will call you to review the results.   Testing/Procedures: Chest CT ordered to be done at [facility]: At [facility], you and your health needs are our priority.  As part of our continuing mission to provide you with exceptional heart care, we have created designated Provider Care Teams.  These Care Teams include your primary Cardiologist (physician) and Advanced Practice Providers (APPs -  Physician Assistants and Nurse Practitioners) who all work together to provide you with the care you need, when you need it.  We recommend signing up for the patient portal called "MyChart".  Sign up information is provided on this After Visit Summary.  MyChart is used to connect with patients for Virtual Visits (Telemedicine).  Patients are able to view lab/test results, encounter notes, upcoming appointments, etc.  Non-urgent messages can be sent to your provider as well.   To learn more about what you can do with MyChart, go to [email or web address].    Your next appointment:   9 month(s)  Provider:   Jyl Heinz, MD    Other Instructions

## 2023-01-25 NOTE — Progress Notes (Signed)
Cardiology Office Note:    Date:  01/25/2023   ID:  Vincent Wiley, DOB 10/07/1941, MRN [id number]  PCP:  Street, Sharon Mt, MD  Cardiologist:  Jenean Lindau, MD   Referring MD: [address], Sharon Mt, *    ASSESSMENT:    1. Atherosclerosis of native coronary artery of native heart without angina pectoris   2. Benign essential hypertension   3. Essential (primary) hypertension   4. Obstructive sleep apnea syndrome   5. Secundum atrial septal defect   6. Aneurysm of ascending aorta without rupture (HCC)    PLAN:    In order of problems listed above:  Coronary atherosclerosis: Secondary prevention stressed with the patient.  Importance of compliance with diet medication stressed any vocalized understanding.  He was advised to stay active and ambulate to the best of his ability. Ascending aortic aneurysm: Will do a CT scan to follow-up the size of the aneurysm.  He is agreeable. Atrial septal defect: Secundum type.  Asymptomatic.  He does not want any further evaluation of this I respect his wishes. Essential hypertension: Blood pressure stable and diet was emphasized. Mixed dyslipidemia: On lipid-lowering medications followed by primary care.  Lipids were reviewed from [facility] sheet and diet emphasized. Paroxysmal atrial fibrillation:I discussed with the patient atrial fibrillation, disease process. Management and therapy including rate and rhythm control, anticoagulation benefits and potential risks were discussed extensively with the patient. Patient had multiple questions which were answered to patient's satisfaction. Amiodarone therapy: I discussed benefits and risks and he mentions to me that his pulmonologist is aware of this.  Will do a Chem-7 liver lipid check today. Patient will be seen in follow-up appointment in 6 months or earlier if the patient has any concerns    Medication Adjustments/Labs and Tests Ordered: Current medicines are reviewed at length with the patient  today.  Concerns regarding medicines are outlined above.  No orders of the defined types were placed in this encounter.  No orders of the defined types were placed in this encounter.    No chief complaint on file.    History of Present Illness:    Vincent Wiley is a 82 y.o. male.  Patient has past medical history of ascending aortic aneurysm, essential hypertension, mixed dyslipidemia, pulmonary fibrosis and second atrial septal defect.  He denies any problems at this time and takes care of activities of daily living.  He is on amiodarone therapy and follows pulmonary on a regular basis.  He is on low-dose amiodarone.  He denies any chest pain orthopnea or PND.  At the time of my evaluation, the patient is alert awake oriented and in no distress.  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    Chronic GI losses, B12 Deficiency   Ascending aortic aneurysm ([place]) 06/08/2021   Benign essential hypertension    Benign prostatic hyperplasia 01/12/2022   Benign prostatic hyperplasia with lower urinary tract symptoms 01/19/2023   BPH with obstruction/lower urinary tract symptoms    Cardiac murmur 03/31/2021   Coronary atherosclerosis 06/08/2021   DOE (dyspnea on exertion)    Encounter for fitting and adjustment of hearing aid 04/19/2022   Encounter for immunization 04/19/2022   Essential (primary) hypertension 01/12/2022   Gastroesophageal reflux disease 01/12/2022   Hyperlipidemia    Idiopathic pulmonary fibrosis ([place]) 01/12/2022   Impacted cerumen, bilateral 04/19/2022   Kidney disease    Obstructive sleep apnea syndrome  04/30/2013   NPSG 01/2012:  AHI 27/hr with desat to 86%.   Other retention of urine 01/19/2023   Paroxysmal atrial fibrillation ([place]) 03/31/2021   Pernicious anemia    Pulmonary fibrosis ([place]) 03/31/2021   Seasonal allergies    Sensorineural hearing loss, bilateral 04/19/2022   Sleep apnea     Vitamin B12 deficiency     Past Surgical History:  Procedure Laterality Date   EYE SURGERY Bilateral    right eye realignment//cataract left eye   HERNIA REPAIR     umbilical   MULTIPLE TOOTH EXTRACTIONS     REPLACEMENT TOTAL KNEE Bilateral     Current Medications: Current Meds  Medication Sig   alfuzosin (UROXATRAL) 10 MG 24 hr tablet Take 10 mg by mouth daily with breakfast.   amiodarone (PACERONE) 200 MG tablet Take 1 tablet (200 mg total) by mouth daily. (Patient taking differently: Take 100 mg by mouth daily. Taking '100mg'$  daily)   apixaban (ELIQUIS) 5 MG TABS tablet Take 1 tablet (5 mg total) by mouth 2 (two) times daily.   bethanechol (URECHOLINE) 50 MG tablet Take 50 mg by mouth 2 (two) times daily.   carvedilol (COREG) 3.125 MG tablet Take 3.125 mg by mouth 2 (two) times daily.   Cholecalciferol (D3 2000) 50 MCG (2000 UT) CAPS Take 2,000 Units by mouth daily at 12 noon.   Coenzyme Q10 (COQ10) 100 MG CAPS Take 100 mg by mouth daily.   finasteride (PROSCAR) 5 MG tablet Take 5 mg by mouth daily.   fluticasone (FLONASE) 50 MCG/ACT nasal spray Place 1 spray into both nostrils 2 (two) times daily.   Multiple Vitamins-Minerals (VITAMIN D3 COMPLETE PO) Take 1 tablet by mouth daily.   Nintedanib (OFEV) 100 MG CAPS Take 1 capsule (100 mg total) by mouth 2 (two) times daily. ** note dose reduction **   pantoprazole (PROTONIX) 40 MG tablet Take 40 mg by mouth 2 (two) times daily.   Probiotic Product (PROBIOTIC DAILY PO) Take 1 tablet by mouth daily.   simvastatin (ZOCOR) 20 MG tablet Take 20 mg by mouth every evening.   sucralfate (CARAFATE) 1 GM/10ML suspension Take 1 g by mouth 2 (two) times daily.   telmisartan (MICARDIS) 20 MG tablet Take 10 mg by mouth daily.   valACYclovir (VALTREX) 500 MG tablet Take 500 mg by mouth 2 (two) times daily as needed for other (Fever blisters). Fever blisters     Allergies:   Sulfamethoxazole, Amlodipine, Ceftriaxone, Ciprofloxacin, Cortisone  acetate [cortisone], Elemental sulfur, Flomax [tamsulosin], Hibiclens [chlorhexidine gluconate], Lisinopril, Other, Penicillins, Probiata [lactobacillus], Rocephin [ceftriaxone sodium in dextrose], and Sulfa antibiotics   Social History   Socioeconomic History   Marital status: Unknown    Spouse name: Not on file   Number of children: Not on file   Years of education: Not on file   Highest education level: Not on file  Occupational History   Occupation: retired  Tobacco Use   Smoking status: Former    Packs/day: 1.50    Years: 25.00    Total pack years: 37.50    Types: Cigarettes    Quit date: 08/27/1983    Years since quitting: 39.4   Smokeless tobacco: Never  Substance and Sexual Activity   Alcohol use: No   Drug use: No   Sexual activity: Not on file  Other Topics Concern   Not on file  Social History Narrative   Not on file   Social Determinants of Health   Financial Resource Strain: Not on  file  Food Insecurity: Not on file  Transportation Needs: Not on file  Physical Activity: Not on file  Stress: Not on file  Social Connections: Not on file     Family History: The patient's family history includes AAA (abdominal aortic aneurysm) in his father; Aneurysm in his mother; Breast cancer in his sister and sister; Heart attack in his father.  ROS:   Please see the history of present illness.    All other systems reviewed and are negative.  EKGs/Labs/Other Studies Reviewed:    The following studies were reviewed today: EKG reveals sinus rhythm and nonspecific ST-T changes   Recent Labs: 02/16/2022: Hemoglobin 11.6; Platelets 213  Recent Lipid Panel No results found for: "CHOL", "TRIG", "HDL", "CHOLHDL", "VLDL", "LDLCALC", "LDLDIRECT"  Physical Exam:    VS:  BP 132/70   Pulse 75   Ht '5\' 9"'$  (1.753 m)   Wt 169 lb 12.8 oz (77 kg)   SpO2 93%   BMI 25.08 kg/m     Wt Readings from Last 3 Encounters:  01/25/23 169 lb 12.8 oz (77 kg)  10/17/22 167 lb (75.8 kg)   07/22/22 166 lb (75.3 kg)     GEN: Patient is in no acute distress HEENT: Normal NECK: No JVD; No carotid bruits LYMPHATICS: No lymphadenopathy CARDIAC: Hear sounds regular, 2/6 systolic murmur at the apex. RESPIRATORY:  Clear to auscultation without rales, wheezing or rhonchi  ABDOMEN: Soft, non-tender, non-distended MUSCULOSKELETAL:  No edema; No deformity  SKIN: Warm and dry NEUROLOGIC:  Alert and oriented x 3 PSYCHIATRIC:  Normal affect   Signed, Jenean Lindau, MD  01/25/2023 11:16 AM    [place]

## 2023-01-26 ENCOUNTER — Telehealth: Payer: Self-pay | Admitting: *Deleted

## 2023-01-26 DIAGNOSIS — N401 Enlarged prostate with lower urinary tract symptoms: Secondary | ICD-10-CM | POA: Diagnosis not present

## 2023-01-26 DIAGNOSIS — E538 Deficiency of other specified B group vitamins: Secondary | ICD-10-CM | POA: Diagnosis not present

## 2023-01-26 DIAGNOSIS — R339 Retention of urine, unspecified: Secondary | ICD-10-CM | POA: Diagnosis not present

## 2023-01-26 LAB — HEPATIC FUNCTION PANEL
ALT: 9 IU/L (ref 0–44)
AST: 10 IU/L (ref 0–40)
Albumin: 3.9 g/dL (ref 3.7–4.7)
Alkaline Phosphatase: 43 IU/L — ABNORMAL LOW (ref 44–121)
Bilirubin Total: 0.3 mg/dL (ref 0.0–1.2)
Bilirubin, Direct: <0.1 mg/dL (ref 0.00–0.40)
Total Protein: 6.4 g/dL (ref 6.0–8.5)

## 2023-01-26 LAB — BASIC METABOLIC PANEL
BUN/Creatinine Ratio: 16 (ref 10–24)
BUN: 19 mg/dL (ref 8–27)
CO2: 23 mmol/L (ref 20–29)
Calcium: 8.9 mg/dL (ref 8.6–10.2)
Chloride: 108 mmol/L — ABNORMAL HIGH (ref 96–106)
Creatinine, Ser: 1.2 mg/dL (ref 0.76–1.27)
Glucose: 96 mg/dL (ref 70–99)
Potassium: 4.1 mmol/L (ref 3.5–5.2)
Sodium: 143 mmol/L (ref 134–144)
eGFR: 61 mL/min/{1.73_m2} (ref >59)

## 2023-01-26 LAB — LIPID PANEL
Chol/HDL Ratio: 2.4 ratio (ref 0.0–5.0)
Cholesterol, Total: 124 mg/dL (ref 100–199)
HDL: 51 mg/dL (ref >39)
LDL Chol Calc (NIH): 59 mg/dL (ref 0–99)
Triglycerides: 70 mg/dL (ref 0–149)
VLDL Cholesterol Cal: 14 mg/dL (ref 5–40)

## 2023-01-26 LAB — TSH: TSH: 1.6 u[IU]/mL (ref 0.450–4.500)

## 2023-01-26 NOTE — Telephone Encounter (Signed)
Let pt know that the precert came back for Chest CT that Dr. Geraldo Pitter ordered and okay to call and schedule appt @ [facility]. Authorization Number is [id number], Exp. 02/24/23.

## 2023-01-27 ENCOUNTER — Telehealth: Payer: Self-pay | Admitting: Pharmacist

## 2023-01-27 NOTE — Telephone Encounter (Signed)
Submitted a Prior Authorization request to [employer] for OFEV via CoverMyMeds. Will update once we receive a response.  Key: Audrie Gallus, PharmD, MPH, BCPS, CPP Clinical Pharmacist (Rheumatology and Pulmonology)

## 2023-01-31 NOTE — Telephone Encounter (Signed)
Received notification from [place] regarding a prior authorization for [place]. Authorization has been APPROVED from 12/28/2022 to 01/28/2024.  Patient must continue to fill through [facility] (pulmonary fibrosis team). [phone]  Authorization # [id number]  Knox Saliva, PharmD, MPH, BCPS, CPP Clinical Pharmacist (Rheumatology and Pulmonology)

## 2023-02-01 DIAGNOSIS — I7121 Aneurysm of the ascending aorta, without rupture: Secondary | ICD-10-CM | POA: Diagnosis not present

## 2023-02-01 DIAGNOSIS — J479 Bronchiectasis, uncomplicated: Secondary | ICD-10-CM | POA: Diagnosis not present

## 2023-02-01 DIAGNOSIS — R599 Enlarged lymph nodes, unspecified: Secondary | ICD-10-CM | POA: Diagnosis not present

## 2023-02-01 DIAGNOSIS — J849 Interstitial pulmonary disease, unspecified: Secondary | ICD-10-CM | POA: Diagnosis not present

## 2023-02-02 DIAGNOSIS — G4733 Obstructive sleep apnea (adult) (pediatric): Secondary | ICD-10-CM | POA: Diagnosis not present

## 2023-02-03 ENCOUNTER — Ambulatory Visit (INDEPENDENT_AMBULATORY_CARE_PROVIDER_SITE_OTHER): Payer: Medicare PPO | Admitting: Pulmonary Disease

## 2023-02-03 ENCOUNTER — Encounter: Payer: Self-pay | Admitting: Pulmonary Disease

## 2023-02-03 VITALS — BP 130/72 | HR 68 | Temp 97.7°F | Ht 69.0 in | Wt 172.0 lb

## 2023-02-03 DIAGNOSIS — Z5181 Encounter for therapeutic drug level monitoring: Secondary | ICD-10-CM

## 2023-02-03 DIAGNOSIS — J84112 Idiopathic pulmonary fibrosis: Secondary | ICD-10-CM | POA: Diagnosis not present

## 2023-02-03 NOTE — Addendum Note (Signed)
Addended by: Elton Sin on: 02/03/2023 11:49 AM   Modules accepted: Orders

## 2023-02-03 NOTE — Patient Instructions (Signed)
I am glad you are stable with your breathing Your recent labs are normal Continue Ofev Will schedule high-res CT and PFTs in 6 months Return to clinic in 6 months

## 2023-02-03 NOTE — Progress Notes (Signed)
Vincent Wiley    [id number]    August 01, 1941  Primary Care Physician:Street, Sharon Mt, MD  Referring Physician: Venetia Maxon, Sharon Mt, MD [address] [place],  [place] [address]  Chief complaint: Follow-up for IPF.  HPI: 82 y.o.  with history of coronary artery disease, paroxysmal atrial fibrillation, hypertension, dyslipidemia. Follows with Dr. Geraldo Pitter, cardiology.  He has been referred for evaluation of interstitial lung disease, pulmonary fibrosis seen on CT scan  Complains of chronic dyspnea on exertion which is worsening over the past few years Denies any cough, sputum production, fevers, chills  Referred for pulmonary rehab at [facility] in late 2022 Started on Esbriet in October 2022.  This has been poorly tolerated with weight loss, loss of appetite, fatigue. Started Dickey Gave in February 2023.  Dose reduced to 100 mg twice daily in July 2023 due to diarrhea.  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  No feather pillows or comforters Smoking history: 26-pack-year smoker.  Quit in 1984 Travel history: No significant travel history Relevant family history: No family history of lung disease  Interim history: He is tolerating the low-dose well with occasional diarrhea Had  few episodes of headaches recently  Outpatient Encounter Medications as of 02/03/2023  Medication Sig   alfuzosin (UROXATRAL) 10 MG 24 hr tablet Take 10 mg by mouth daily with breakfast.   amiodarone (PACERONE) 200 MG tablet Take 0.5 tablets (100 mg total) by mouth daily.   apixaban (ELIQUIS) 5 MG TABS tablet Take 1 tablet (5 mg total) by mouth 2 (two) times daily.   bethanechol (URECHOLINE) 50 MG tablet Take 50 mg by mouth 2 (two) times daily.   Cholecalciferol (D3 2000) 50 MCG (2000 UT) CAPS Take 2,000 Units by mouth daily at 12 noon.   Coenzyme Q10 (COQ10) 100 MG CAPS Take 100 mg by mouth daily.    finasteride (PROSCAR) 5 MG tablet Take 5 mg by mouth daily.   fluticasone (FLONASE) 50 MCG/ACT nasal spray Place 1 spray into both nostrils 2 (two) times daily.   Multiple Vitamins-Minerals (VITAMIN D3 COMPLETE PO) Take 1 tablet by mouth daily.   Nintedanib (OFEV) 100 MG CAPS Take 1 capsule (100 mg total) by mouth 2 (two) times daily. ** note dose reduction **   pantoprazole (PROTONIX) 40 MG tablet Take 40 mg by mouth 2 (two) times daily.   Probiotic Product (PROBIOTIC DAILY PO) Take 1 tablet by mouth daily.   simvastatin (ZOCOR) 20 MG tablet Take 20 mg by mouth every evening.   sucralfate (CARAFATE) 1 GM/10ML suspension Take 1 g by mouth 2 (two) times daily.   telmisartan (MICARDIS) 20 MG tablet Take 10 mg by mouth daily.   valACYclovir (VALTREX) 500 MG tablet Take 500 mg by mouth 2 (two) times daily as needed for other (Fever blisters). Fever blisters (Patient not taking: Reported on 02/03/2023)   [DISCONTINUED] carvedilol (COREG) 3.125 MG tablet Take 3.125 mg by mouth 2 (two) times daily.   No facility-administered encounter medications on file as of 02/03/2023.    Physical Exam: Blood pressure 130/72, pulse 68, temperature 97.7 F (36.5 C), temperature source Oral, height 5' 9"$  (1.753 m), weight 172 lb (78 kg), SpO2 95 %. Gen:      No acute distress HEENT:  EOMI, sclera anicteric Neck:     No masses; no thyromegaly Lungs:    Bibasal crackles CV:  Regular rate and rhythm; no murmurs Abd:      + bowel sounds; soft, non-tender; no palpable masses, no distension Ext:    No edema; adequate peripheral perfusion Skin:      Warm and dry; no rash Neuro: alert and oriented x 3 Psych: normal mood and affect   Data Reviewed: Imaging: High-res CT 03/28/2021-pulmonary fibrosis in UIP pattern.  Honeycombing present, bronchial wall thickening, emphysema, coronary artery disease, aortic aneurysm, enlargement of pulmonary artery  CT chest 05/03/2021-redemonstrated pulmonary  fibrosis  High-resolution CT 05/09/2022-UIP pulmonary fibrosis with progression. I have reviewed the images personally.  Cardiac: Echocardiogram 04/03/2019- LVEF 60 to 65%, mild concentric LVH, grade 3 diastolic dysfunction, mildly elevated PA systolic pressure, ASD  PFTs: 09/03/2021 FVC 2.70 [102%], FEV1 3.22 [126%], F/F 87, TLC 5.21 [79%], DLCO 13.51 [59%] Severe diffusion defect  07/22/2022 FVC 3.28 [94%], FEV1 2.96 [117%], F/F 88, TLC 5.29 [80%], DLCO 10.29 [45%] Severe diffusion defect  Labs: CTD serologies 09/03/2021- negative  Hepatic panel from primary care dated 07/06/2022 Within normal limits except for low albumin of 3.3  Received hepatic panel from primary care dated 11/09/2022 Alk phos is borderline low at 37, rest of hepatic panel is within normal limits.  CMP 01/25/23-significant only for alk phos of 43  Cardiac Echocardiogram 04/02/2021-LVEF 60-65%, normal RV systolic size and function.  Mildly elevated PA systolic pressure.  Sleep: Overnight oximetry on CPAP and oxygen dated 04/21/2022 Duration of study 8 hours 46 minutes Nadir O2 sat of 90%.  Oxygen saturations remained around 96% for majority of the study  Assessment:  Pulmonary fibrosis, IPF I have reviewed prior CT scans and typical UIP pattern with honeycombing.  This is likely to be idiopathic pulmonary fibrosis in the absence of any exposures or signs and symptoms of connective tissue disease.  CTD serologies are negative  Discussed findings and diagnosis with patient No need for biopsy as he diagnosis of IPF is fairly certain Esbriet is poorly tolerated and he is now on Ofev Labs drawn last week showed normal hepatic panel Finished pulmonary rehab  Pulmonary hypertension He has mild elevation in PA systolic pressure on echocardiogram This is likely secondary to left heart disease with diastolic dysfunction and OSA  Obstructive sleep apnea Compliant with CPAP and 2 L oxygen at night.  Follows with Dr.  Maxwell Caul  Plan/Recommendations: Continue Ofev at reduced dose Monitor labs Follow-up CT and PFTs in 6 months  [name] MD [facility] 02/03/2023, 11:11 AM  CC: Street, [place], [place]

## 2023-02-06 DIAGNOSIS — L578 Other skin changes due to chronic exposure to nonionizing radiation: Secondary | ICD-10-CM | POA: Diagnosis not present

## 2023-02-06 DIAGNOSIS — L821 Other seborrheic keratosis: Secondary | ICD-10-CM | POA: Diagnosis not present

## 2023-02-06 DIAGNOSIS — L570 Actinic keratosis: Secondary | ICD-10-CM | POA: Diagnosis not present

## 2023-02-09 ENCOUNTER — Telehealth: Payer: Self-pay

## 2023-02-09 NOTE — Telephone Encounter (Signed)
Informed patient of CT chest results done at [facility] in which Dr. Geraldo Pitter stated it was stable and we will recheck in 1 year.  He thanked me for the call and had no additional questions.

## 2023-02-24 DIAGNOSIS — N401 Enlarged prostate with lower urinary tract symptoms: Secondary | ICD-10-CM | POA: Diagnosis not present

## 2023-02-24 DIAGNOSIS — E538 Deficiency of other specified B group vitamins: Secondary | ICD-10-CM | POA: Diagnosis not present

## 2023-02-24 DIAGNOSIS — R339 Retention of urine, unspecified: Secondary | ICD-10-CM | POA: Diagnosis not present

## 2023-03-03 DIAGNOSIS — G4733 Obstructive sleep apnea (adult) (pediatric): Secondary | ICD-10-CM | POA: Diagnosis not present

## 2023-03-06 DIAGNOSIS — N401 Enlarged prostate with lower urinary tract symptoms: Secondary | ICD-10-CM | POA: Diagnosis not present

## 2023-03-06 DIAGNOSIS — R972 Elevated prostate specific antigen [PSA]: Secondary | ICD-10-CM | POA: Diagnosis not present

## 2023-03-06 DIAGNOSIS — R339 Retention of urine, unspecified: Secondary | ICD-10-CM | POA: Diagnosis not present

## 2023-03-23 DIAGNOSIS — D500 Iron deficiency anemia secondary to blood loss (chronic): Secondary | ICD-10-CM | POA: Diagnosis not present

## 2023-03-25 DIAGNOSIS — G4733 Obstructive sleep apnea (adult) (pediatric): Secondary | ICD-10-CM | POA: Diagnosis not present

## 2023-03-27 DIAGNOSIS — N401 Enlarged prostate with lower urinary tract symptoms: Secondary | ICD-10-CM | POA: Diagnosis not present

## 2023-03-27 DIAGNOSIS — E538 Deficiency of other specified B group vitamins: Secondary | ICD-10-CM | POA: Diagnosis not present

## 2023-03-27 DIAGNOSIS — R339 Retention of urine, unspecified: Secondary | ICD-10-CM | POA: Diagnosis not present

## 2023-04-03 DIAGNOSIS — G4733 Obstructive sleep apnea (adult) (pediatric): Secondary | ICD-10-CM | POA: Diagnosis not present

## 2023-04-05 DIAGNOSIS — G4733 Obstructive sleep apnea (adult) (pediatric): Secondary | ICD-10-CM | POA: Diagnosis not present

## 2023-04-22 ENCOUNTER — Other Ambulatory Visit: Payer: Self-pay | Admitting: Pulmonary Disease

## 2023-04-26 DIAGNOSIS — D519 Vitamin B12 deficiency anemia, unspecified: Secondary | ICD-10-CM | POA: Diagnosis not present

## 2023-04-27 DIAGNOSIS — N401 Enlarged prostate with lower urinary tract symptoms: Secondary | ICD-10-CM | POA: Diagnosis not present

## 2023-04-27 DIAGNOSIS — R339 Retention of urine, unspecified: Secondary | ICD-10-CM | POA: Diagnosis not present

## 2023-05-03 DIAGNOSIS — G4733 Obstructive sleep apnea (adult) (pediatric): Secondary | ICD-10-CM | POA: Diagnosis not present

## 2023-05-15 DIAGNOSIS — D500 Iron deficiency anemia secondary to blood loss (chronic): Secondary | ICD-10-CM | POA: Diagnosis not present

## 2023-05-15 DIAGNOSIS — K2961 Other gastritis with bleeding: Secondary | ICD-10-CM | POA: Diagnosis not present

## 2023-05-15 DIAGNOSIS — R195 Other fecal abnormalities: Secondary | ICD-10-CM | POA: Diagnosis not present

## 2023-05-15 DIAGNOSIS — Z7901 Long term (current) use of anticoagulants: Secondary | ICD-10-CM | POA: Diagnosis not present

## 2023-05-26 DIAGNOSIS — J9611 Chronic respiratory failure with hypoxia: Secondary | ICD-10-CM | POA: Diagnosis not present

## 2023-05-26 DIAGNOSIS — Z79899 Other long term (current) drug therapy: Secondary | ICD-10-CM | POA: Diagnosis not present

## 2023-05-26 DIAGNOSIS — G4733 Obstructive sleep apnea (adult) (pediatric): Secondary | ICD-10-CM | POA: Diagnosis not present

## 2023-05-26 DIAGNOSIS — R7302 Impaired glucose tolerance (oral): Secondary | ICD-10-CM | POA: Diagnosis not present

## 2023-05-26 DIAGNOSIS — E782 Mixed hyperlipidemia: Secondary | ICD-10-CM | POA: Diagnosis not present

## 2023-05-26 DIAGNOSIS — E538 Deficiency of other specified B group vitamins: Secondary | ICD-10-CM | POA: Diagnosis not present

## 2023-05-26 DIAGNOSIS — G4734 Idiopathic sleep related nonobstructive alveolar hypoventilation: Secondary | ICD-10-CM | POA: Diagnosis not present

## 2023-05-26 DIAGNOSIS — J8410 Pulmonary fibrosis, unspecified: Secondary | ICD-10-CM | POA: Diagnosis not present

## 2023-05-26 DIAGNOSIS — I10 Essential (primary) hypertension: Secondary | ICD-10-CM | POA: Diagnosis not present

## 2023-05-29 DIAGNOSIS — N401 Enlarged prostate with lower urinary tract symptoms: Secondary | ICD-10-CM | POA: Diagnosis not present

## 2023-05-29 DIAGNOSIS — R339 Retention of urine, unspecified: Secondary | ICD-10-CM | POA: Diagnosis not present

## 2023-06-03 DIAGNOSIS — G4733 Obstructive sleep apnea (adult) (pediatric): Secondary | ICD-10-CM | POA: Diagnosis not present

## 2023-06-08 DIAGNOSIS — H02104 Unspecified ectropion of left upper eyelid: Secondary | ICD-10-CM | POA: Diagnosis not present

## 2023-06-08 DIAGNOSIS — H04121 Dry eye syndrome of right lacrimal gland: Secondary | ICD-10-CM | POA: Diagnosis not present

## 2023-06-14 DIAGNOSIS — R972 Elevated prostate specific antigen [PSA]: Secondary | ICD-10-CM | POA: Diagnosis not present

## 2023-06-14 DIAGNOSIS — N401 Enlarged prostate with lower urinary tract symptoms: Secondary | ICD-10-CM | POA: Diagnosis not present

## 2023-06-14 DIAGNOSIS — R339 Retention of urine, unspecified: Secondary | ICD-10-CM | POA: Diagnosis not present

## 2023-06-19 ENCOUNTER — Telehealth: Payer: Self-pay | Admitting: Pulmonary Disease

## 2023-06-19 NOTE — Telephone Encounter (Signed)
Vincent Wiley states needs the CT scan authorization for 07/05/2023. CT scan 06/20/2023. Vincent Wiley phone number is [phone].Fax number is [phone].

## 2023-06-19 NOTE — Telephone Encounter (Signed)
Sent teams message to Benna Dunks - per message pt scheduled for tomorrow but per her note on referral may not be due yet?  Will route message to her.

## 2023-06-23 DIAGNOSIS — G4733 Obstructive sleep apnea (adult) (pediatric): Secondary | ICD-10-CM | POA: Diagnosis not present

## 2023-06-26 DIAGNOSIS — R339 Retention of urine, unspecified: Secondary | ICD-10-CM | POA: Diagnosis not present

## 2023-06-26 DIAGNOSIS — N401 Enlarged prostate with lower urinary tract symptoms: Secondary | ICD-10-CM | POA: Diagnosis not present

## 2023-06-26 DIAGNOSIS — D519 Vitamin B12 deficiency anemia, unspecified: Secondary | ICD-10-CM | POA: Diagnosis not present

## 2023-06-27 ENCOUNTER — Other Ambulatory Visit: Payer: Self-pay | Admitting: Pulmonary Disease

## 2023-06-27 DIAGNOSIS — J849 Interstitial pulmonary disease, unspecified: Secondary | ICD-10-CM

## 2023-06-27 DIAGNOSIS — J84112 Idiopathic pulmonary fibrosis: Secondary | ICD-10-CM

## 2023-06-27 NOTE — Telephone Encounter (Signed)
CT rescheduled for August. Auth has been obtained and sent to [facility]

## 2023-07-03 DIAGNOSIS — G4733 Obstructive sleep apnea (adult) (pediatric): Secondary | ICD-10-CM | POA: Diagnosis not present

## 2023-07-04 DIAGNOSIS — N390 Urinary tract infection, site not specified: Secondary | ICD-10-CM | POA: Diagnosis not present

## 2023-07-04 NOTE — Telephone Encounter (Signed)
Refill sent for OFEV to [facility] (pulmonary fibrosis team). [phone]  Dose: 100 mg twice daily  Last OV: 02/03/23 Provider: Dr. Isaiah Serge  Next OV: 08/09/23  Chesley Mires, PharmD, MPH, BCPS Clinical Pharmacist (Rheumatology and Pulmonology)

## 2023-07-05 ENCOUNTER — Other Ambulatory Visit: Payer: Self-pay | Admitting: Pharmacist

## 2023-07-05 DIAGNOSIS — J84112 Idiopathic pulmonary fibrosis: Secondary | ICD-10-CM

## 2023-07-05 DIAGNOSIS — J849 Interstitial pulmonary disease, unspecified: Secondary | ICD-10-CM

## 2023-07-05 MED ORDER — OFEV 100 MG PO CAPS: 1.0000 | ORAL_CAPSULE | Freq: Two times a day (BID) | ORAL | 1 refills | Status: DC

## 2023-07-05 NOTE — Telephone Encounter (Signed)
Refill sent for OFEV to [facility] (pulmonary fibrosis team). [phone]  Dose: 100 mg twice daily  Last OV: 02/03/23 Provider: Dr. Isaiah Serge  Next OV: 08/09/23  Chesley Mires, PharmD, MPH, BCPS Clinical Pharmacist (Rheumatology and Pulmonology)

## 2023-07-27 DIAGNOSIS — R339 Retention of urine, unspecified: Secondary | ICD-10-CM | POA: Diagnosis not present

## 2023-07-27 DIAGNOSIS — N401 Enlarged prostate with lower urinary tract symptoms: Secondary | ICD-10-CM | POA: Diagnosis not present

## 2023-07-27 DIAGNOSIS — D519 Vitamin B12 deficiency anemia, unspecified: Secondary | ICD-10-CM | POA: Diagnosis not present

## 2023-08-01 ENCOUNTER — Telehealth: Payer: Self-pay | Admitting: Pulmonary Disease

## 2023-08-01 NOTE — Telephone Encounter (Signed)
Order is being faxed.

## 2023-08-02 DIAGNOSIS — J8410 Pulmonary fibrosis, unspecified: Secondary | ICD-10-CM | POA: Diagnosis not present

## 2023-08-02 DIAGNOSIS — J84112 Idiopathic pulmonary fibrosis: Secondary | ICD-10-CM | POA: Diagnosis not present

## 2023-08-02 DIAGNOSIS — R590 Localized enlarged lymph nodes: Secondary | ICD-10-CM | POA: Diagnosis not present

## 2023-08-02 DIAGNOSIS — R918 Other nonspecific abnormal finding of lung field: Secondary | ICD-10-CM | POA: Diagnosis not present

## 2023-08-03 DIAGNOSIS — G4733 Obstructive sleep apnea (adult) (pediatric): Secondary | ICD-10-CM | POA: Diagnosis not present

## 2023-08-07 DIAGNOSIS — L570 Actinic keratosis: Secondary | ICD-10-CM | POA: Diagnosis not present

## 2023-08-07 DIAGNOSIS — L219 Seborrheic dermatitis, unspecified: Secondary | ICD-10-CM | POA: Diagnosis not present

## 2023-08-07 DIAGNOSIS — L578 Other skin changes due to chronic exposure to nonionizing radiation: Secondary | ICD-10-CM | POA: Diagnosis not present

## 2023-08-09 ENCOUNTER — Encounter: Payer: Self-pay | Admitting: Pulmonary Disease

## 2023-08-09 ENCOUNTER — Ambulatory Visit (INDEPENDENT_AMBULATORY_CARE_PROVIDER_SITE_OTHER): Payer: Medicare PPO | Admitting: Pulmonary Disease

## 2023-08-09 VITALS — BP 110/60 | HR 86 | Temp 98.4°F | Ht 69.0 in | Wt 176.6 lb

## 2023-08-09 DIAGNOSIS — Z5181 Encounter for therapeutic drug level monitoring: Secondary | ICD-10-CM

## 2023-08-09 DIAGNOSIS — R0609 Other forms of dyspnea: Secondary | ICD-10-CM | POA: Diagnosis not present

## 2023-08-09 DIAGNOSIS — J84112 Idiopathic pulmonary fibrosis: Secondary | ICD-10-CM | POA: Diagnosis not present

## 2023-08-09 MED ORDER — TRELEGY ELLIPTA 100-62.5-25 MCG/ACT IN AEPB: 1.0000 | INHALATION_SPRAY | Freq: Every day | RESPIRATORY_TRACT | 11 refills | Status: DC

## 2023-08-09 NOTE — Patient Instructions (Addendum)
I am glad you are doing well with your breathing We will prescribe Trelegy 100 inhaler Check complaints metabolic panel and proBNP Follow-up in 6 months

## 2023-08-09 NOTE — Progress Notes (Signed)
Vincent Wiley    [id number]    1941/07/20  Primary Care Physician:Street, Stephanie Coup, MD  Referring Physician: Casper Harrison, Stephanie Coup, MD [address] [place],  [place] [address]  Chief complaint: Follow-up for IPF.  HPI: 82 y.o.  with history of coronary artery disease, paroxysmal atrial fibrillation, hypertension, dyslipidemia. Follows with Dr. Tomie China, cardiology.  He has been referred for evaluation of interstitial lung disease, pulmonary fibrosis seen on CT scan  Complains of chronic dyspnea on exertion which is worsening over the past few years Denies any cough, sputum production, fevers, chills  Referred for pulmonary rehab at [facility] in late 2022 Started on Esbriet in October 2022.  This has been poorly tolerated with weight loss, loss of appetite, fatigue. Started Durel Salts in February 2023.  Dose reduced to 100 mg twice daily in July 2023 due to diarrhea.  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  No feather pillows or comforters Smoking history: 26-pack-year smoker.  Quit in 1984 Travel history: No significant travel history Relevant family history: No family history of lung disease  Interim history: He is tolerating the low-dose well with occasional diarrhea Had  few episodes of headaches recently He is here for review of recent CT scan  Outpatient Encounter Medications as of 08/09/2023  Medication Sig   alfuzosin (UROXATRAL) 10 MG 24 hr tablet Take 10 mg by mouth daily with breakfast.   amiodarone (PACERONE) 200 MG tablet Take 0.5 tablets (100 mg total) by mouth daily.   apixaban (ELIQUIS) 5 MG TABS tablet Take 1 tablet (5 mg total) by mouth 2 (two) times daily.   bethanechol (URECHOLINE) 50 MG tablet Take 50 mg by mouth 2 (two) times daily.   Cholecalciferol (D3 2000) 50 MCG (2000 UT) CAPS Take 2,000 Units by mouth daily at 12 noon.   Coenzyme Q10  (COQ10) 100 MG CAPS Take 100 mg by mouth daily.   finasteride (PROSCAR) 5 MG tablet Take 5 mg by mouth daily.   fluticasone (FLONASE) 50 MCG/ACT nasal spray Place 1 spray into both nostrils 2 (two) times daily.   methenamine (HIPREX) 1 g tablet Take 1 g by mouth every morning.   Multiple Vitamins-Minerals (VITAMIN D3 COMPLETE PO) Take 1 tablet by mouth daily.   Nintedanib (OFEV) 100 MG CAPS Take 1 capsule (100 mg total) by mouth 2 (two) times daily.   pantoprazole (PROTONIX) 40 MG tablet Take 40 mg by mouth 2 (two) times daily.   Probiotic Product (PROBIOTIC DAILY PO) Take 1 tablet by mouth daily.   simvastatin (ZOCOR) 20 MG tablet Take 20 mg by mouth every evening.   sucralfate (CARAFATE) 1 GM/10ML suspension Take 1 g by mouth 2 (two) times daily.   telmisartan (MICARDIS) 20 MG tablet Take 10 mg by mouth daily.   valACYclovir (VALTREX) 500 MG tablet Take 500 mg by mouth 2 (two) times daily as needed for other (Fever blisters). Fever blisters   No facility-administered encounter medications on file as of 08/09/2023.    Physical Exam: Blood pressure 110/60, pulse 86, temperature 98.4 F (36.9 C), temperature source Oral, height 5\' 9"  (1.753 m), weight 176 lb 9.6 oz (80.1 kg), SpO2 94%. Gen:      No acute distress HEENT:  EOMI, sclera anicteric Neck:     No masses; no thyromegaly Lungs:    Clear to auscultation bilaterally; normal respiratory effort CV:  Regular rate and rhythm; no murmurs Abd:      + bowel sounds; soft, non-tender; no palpable masses, no distension Ext:    No edema; adequate peripheral perfusion Skin:      Warm and dry; no rash Neuro: alert and oriented x 3 Psych: normal mood and affect   Data Reviewed: Imaging: High-res CT 03/28/2021-pulmonary fibrosis in UIP pattern.  Honeycombing present, bronchial wall thickening, emphysema, coronary artery disease, aortic aneurysm, enlargement of pulmonary artery  CT chest 05/03/2021-redemonstrated pulmonary  fibrosis  High-resolution CT 05/09/2022-UIP pulmonary fibrosis with progression.  High resolution CT 08/02/2023-stable pattern of UIP pulmonary fibrosis.  I have reviewed the images personally.  Cardiac: Echocardiogram 04/03/2019- LVEF 60 to 65%, mild concentric LVH, grade 3 diastolic dysfunction, mildly elevated PA systolic pressure, ASD  PFTs: 12/29/863 FVC 2.70 [102%], FEV1 3.22 [126%], F/F 87, TLC 5.21 [79%], DLCO 13.51 [59%] Severe diffusion defect  07/22/2022 FVC 3.28 [94%], FEV1 2.96 [117%], F/F 88, TLC 5.29 [80%], DLCO 10.29 [45%] Severe diffusion defect  Labs: CTD serologies 09/03/2021- negative  Hepatic panel from primary care dated 07/06/2022 Within normal limits except for low albumin of 3.3  Received hepatic panel from primary care dated 11/09/2022 Alk phos is borderline low at 37, rest of hepatic panel is within normal limits.  CMP 01/25/23-significant only for alk phos of 43  Cardiac Echocardiogram 04/02/2021-LVEF 60-65%, normal RV systolic size and function.  Mildly elevated PA systolic pressure.  Sleep: Overnight oximetry on CPAP and oxygen dated 04/21/2022 Duration of study 8 hours 46 minutes Nadir O2 sat of 90%.  Oxygen saturations remained around 96% for majority of the study  Assessment:  Pulmonary fibrosis, IPF I have reviewed prior CT scans and typical UIP pattern with honeycombing.  This is likely to be idiopathic pulmonary fibrosis in the absence of any exposures or signs and symptoms of connective tissue disease.  CTD serologies are negative  Discussed findings and diagnosis with patient No need for biopsy as he diagnosis of IPF is fairly certain Esbriet is poorly tolerated and he is now on Ofev Check Hepatic panel High-res CT stable Finished pulmonary rehab  We took him off Trelegy but his cough returned and improved again when he really resumed the inhaler.  He wants a prescription to continue the same.  Pulmonary hypertension He has mild  elevation in PA systolic pressure on echocardiogram This is likely secondary to left heart disease with diastolic dysfunction and OSA Check proBNP  Obstructive sleep apnea Compliant with CPAP and 2 L oxygen at night.  Follows with [facility]  Plan/Recommendations: Continue Ofev at reduced dose Monitor labs Restart Trelegy 100   Chilton Greathouse MD [facility] 08/09/2023, 3:01 PM  CC: Street, [place], *

## 2023-08-10 LAB — COMPREHENSIVE METABOLIC PANEL
ALT: 10 U/L (ref 0–53)
AST: 15 U/L (ref 0–37)
Albumin: 3.9 g/dL (ref 3.5–5.2)
Alkaline Phosphatase: 36 U/L — ABNORMAL LOW (ref 39–117)
BUN: 22 mg/dL (ref 6–23)
CO2: 27 meq/L (ref 19–32)
Calcium: 9 mg/dL (ref 8.4–10.5)
Chloride: 105 meq/L (ref 96–112)
Creatinine, Ser: 1.24 mg/dL (ref 0.40–1.50)
GFR: 54.19 mL/min — ABNORMAL LOW (ref >60.00)
Glucose, Bld: 95 mg/dL (ref 70–99)
Potassium: 4.1 meq/L (ref 3.5–5.1)
Sodium: 139 meq/L (ref 135–145)
Total Bilirubin: 0.4 mg/dL (ref 0.2–1.2)
Total Protein: 7.1 g/dL (ref 6.0–8.3)

## 2023-08-10 LAB — PRO B NATRIURETIC PEPTIDE: NT-Pro BNP: 359 pg/mL (ref 0–486)

## 2023-08-21 ENCOUNTER — Telehealth: Payer: Self-pay | Admitting: Pulmonary Disease

## 2023-08-21 NOTE — Telephone Encounter (Signed)
Pt. Calling [facility] saying they need additional info to get him his Trelegy please advise pt.

## 2023-08-22 ENCOUNTER — Other Ambulatory Visit: Payer: Self-pay

## 2023-08-22 NOTE — Telephone Encounter (Signed)
Per test claim PA is not needed, medication has been filled 08/27 via mail order

## 2023-08-27 DIAGNOSIS — N401 Enlarged prostate with lower urinary tract symptoms: Secondary | ICD-10-CM | POA: Diagnosis not present

## 2023-08-27 DIAGNOSIS — R339 Retention of urine, unspecified: Secondary | ICD-10-CM | POA: Diagnosis not present

## 2023-08-29 DIAGNOSIS — D519 Vitamin B12 deficiency anemia, unspecified: Secondary | ICD-10-CM | POA: Diagnosis not present

## 2023-09-01 NOTE — Telephone Encounter (Signed)
Called pt. Back and he did receive the inhaler with [facility]

## 2023-09-03 DIAGNOSIS — G4733 Obstructive sleep apnea (adult) (pediatric): Secondary | ICD-10-CM | POA: Diagnosis not present

## 2023-09-04 ENCOUNTER — Telehealth: Payer: Self-pay | Admitting: Pulmonary Disease

## 2023-09-04 NOTE — Telephone Encounter (Signed)
Patient states Trelegy caused rash. Would like something else called into pharmacy. Pharmacy is [facility]. Patient phone number is [phone].

## 2023-09-14 NOTE — Telephone Encounter (Signed)
Patient is calling back stating that he has a rash from his medication trelegy. He stopped the medication but thinks now that it might be from Ofvev.. Please call and advise [phone]

## 2023-09-14 NOTE — Telephone Encounter (Signed)
Called and spoke with pt, he started trelegy last week and 3 days later and he had a rash. It is an ingredient in the trelegy that th ept is allergic to. He stop taking the trelegy and the hives was clearing up but now they are back. Pt now believes it may be ofvev, but is not really sure.

## 2023-09-15 NOTE — Telephone Encounter (Signed)
Called patient and left message to call back Okay to hold off for now When he calls back please make a follow-up appointment at next available with me or APP

## 2023-09-15 NOTE — Telephone Encounter (Signed)
Patient was notified of message and appointment was made for 09/21/23 with Rubye Oaks NP.

## 2023-09-21 ENCOUNTER — Ambulatory Visit: Payer: Medicare PPO | Admitting: Adult Health

## 2023-09-21 ENCOUNTER — Encounter: Payer: Self-pay | Admitting: Adult Health

## 2023-09-21 VITALS — BP 118/68 | HR 91 | Temp 97.9°F | Ht 69.0 in | Wt 176.6 lb

## 2023-09-21 DIAGNOSIS — J9611 Chronic respiratory failure with hypoxia: Secondary | ICD-10-CM | POA: Diagnosis not present

## 2023-09-21 DIAGNOSIS — I5032 Chronic diastolic (congestive) heart failure: Secondary | ICD-10-CM | POA: Diagnosis not present

## 2023-09-21 DIAGNOSIS — I5030 Unspecified diastolic (congestive) heart failure: Secondary | ICD-10-CM

## 2023-09-21 DIAGNOSIS — J84112 Idiopathic pulmonary fibrosis: Secondary | ICD-10-CM

## 2023-09-21 DIAGNOSIS — G4733 Obstructive sleep apnea (adult) (pediatric): Secondary | ICD-10-CM | POA: Diagnosis not present

## 2023-09-21 DIAGNOSIS — R21 Rash and other nonspecific skin eruption: Secondary | ICD-10-CM | POA: Diagnosis not present

## 2023-09-21 DIAGNOSIS — J439 Emphysema, unspecified: Secondary | ICD-10-CM | POA: Insufficient documentation

## 2023-09-21 HISTORY — DX: Chronic respiratory failure with hypoxia: J96.11

## 2023-09-21 HISTORY — DX: Unspecified diastolic (congestive) heart failure: I50.30

## 2023-09-21 HISTORY — DX: Rash and other nonspecific skin eruption: R21

## 2023-09-21 HISTORY — DX: Emphysema, unspecified: J43.9

## 2023-09-21 MED ORDER — STIOLTO RESPIMAT 2.5-2.5 MCG/ACT IN AERS: 2.0000 | INHALATION_SPRAY | Freq: Every day | RESPIRATORY_TRACT | 3 refills | Status: DC

## 2023-09-21 MED ORDER — STIOLTO RESPIMAT 2.5-2.5 MCG/ACT IN AERS: 2.0000 | INHALATION_SPRAY | Freq: Every day | RESPIRATORY_TRACT | Status: DC

## 2023-09-21 NOTE — Assessment & Plan Note (Signed)
Emphysema complicated by pulmonary fibrosis.  Patient has significant symptom burden with shortness of breath and cough.  Most likely due to combination of both emphysema and pulmonary fibrosis.  However has had perceived benefit on Trelegy.  Unclear if patient's rash was a side effect of Trelegy.  For now remain off of Trelegy.  Will try Stiolto.  Check PFTs with DLCO return  Plan  Patient Instructions  Remain off Trelegy and OFEV Begin Stiolto 2 puffs daily  Activity as tolerated.  Continue on Oxygen 2l/m At bedtime   If rash does not resolve need to see Primary MD.  Delsym 2 tsp Twice daily As needed  cough.  Follow up with Dr. Isaiah Serge or Olimpia Tinch NP in 2 months  with Spirometry with [facility].  Please contact office for sooner follow up if symptoms do not improve or worsen or seek emergency care

## 2023-09-21 NOTE — Assessment & Plan Note (Signed)
Rash ? Etiology unclear . Seems to be resolving .  Would be unusual that this would be related to [facility] or Trelegy.  Rash does seem to be resolving.  Will remain off of Trelegy and Ofev for now.  If rash does not totally resolve or reappears he will need to follow-up with his primary care provider.

## 2023-09-21 NOTE — Progress Notes (Signed)
Patient seen in the office today and instructed on use of Stiolto.  Patient expressed understanding and demonstrated technique.

## 2023-09-21 NOTE — Progress Notes (Signed)
@Patient  ID: Vincent Wiley, male    DOB: 1941-12-21, 82 y.o.   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  No feather pillows or comforters Smoking history: 26-pack-year smoker.  Quit in 1984 Travel history: No significant travel history Relevant family history: No family history of lung disease  High-res CT 03/28/2021-pulmonary fibrosis in UIP pattern.  Honeycombing present, bronchial wall thickening, emphysema, coronary artery disease, aortic aneurysm, enlargement of pulmonary artery  CT chest 05/03/2021-redemonstrated pulmonary fibrosis   High-resolution CT 05/09/2022-UIP pulmonary fibrosis with progression.   High resolution CT 08/02/2023-stable pattern of UIP pulmonary fibrosis.      Cardiac: Echocardiogram 04/03/2019- LVEF 60 to 65%, mild concentric LVH, grade 3 diastolic dysfunction, mildly elevated PA systolic pressure, ASD-secundum type    PFTs: 09/03/2021 FVC 2.70 [102%], FEV1 3.22 [126%], F/F 87, TLC 5.21 [79%], DLCO 13.51 [59%] Severe diffusion defect   07/22/2022 FVC 3.28 [94%], FEV1 2.96 [117%], F/F 88, TLC 5.29 [80%], DLCO 10.29 [45%] Severe diffusion defect   Labs: CTD serologies 09/03/2021- negative   Hepatic panel from primary care dated 07/06/2022 Within normal limits except for low albumin of 3.3   Received hepatic panel from primary care dated 11/09/2022 Alk phos is borderline low at 37, rest of hepatic panel is within normal limits.   CMP 01/25/23-significant only for alk phos of 43    Cardiac Echocardiogram 04/02/2021-LVEF 60-65%, normal RV systolic size and function.  Mildly elevated PA systolic pressure.   Sleep: Overnight oximetry on CPAP and oxygen dated 04/21/2022 Duration of study 8 hours 46 minutes Nadir O2 sat of 90%.  Oxygen saturations remained around 96% for majority of the study  09/21/2023 Follow up : IPF (UIP pattern on CT )  Patient presents for a work in visit.  Patient is followed for IPF with progressive changes on CT scan.  Patient has previously been on Esbriet but was unable to tolerate.  He was changed over to [facility] in February 2023.-Had his dose decreased due to GI side effects with diarrhea.  Last month patient was restarted on Trelegy due to ongoing cough.  He does have significant emphysematous changes with previous smoking history.  Patient says that around 2 weeks ago he developed a rash with scattered red spots that burned and itched along his lower extremities a few scattered on his arms and torso.  Patient for stopped his Trelegy and then stopped Ofev.  Rash is starting to improve.  Patient also was having increased diarrhea and upset stomach.  Patient says he continues to have shortness of breath with minimum activities.  He does have significant diastolic heart failure, A-fib and is on amiodarone and anticoagulation therapy. He remains on oxygen 2 L at bedtime.  He has a minimally productive dry cough.  No fever, discolored mucus, hemoptysis.  High-resolution CT chest August 2024 showed stable pattern of UIP fibrosis and emphysema. Diarrhea has resolved since stopping Ofev.  Rash is nearly gone    Allergies  Allergen Reactions   Sulfamethoxazole Other (See Comments)  Severe reaction   Amlodipine     Tingling hands   Ceftriaxone     Other reaction(s): Unknown   Ciprofloxacin Itching    Edema of ear   Cortisone Acetate [Cortisone] Hives    Hive reaction to injectable Kenalog and topical reaction to Cortisporin   Elemental Sulfur     Flomax [Tamsulosin] Other (See Comments)    Dizziness, blurred vision and Headache   Hibiclens [Chlorhexidine Gluconate]    Lisinopril    Other     Steroid Injections   Penicillins    Probiata [Lactobacillus]    Rocephin [Ceftriaxone Sodium In Dextrose]    Sulfa Antibiotics     Other reaction(s): Unknown    Immunization History  Administered Date(s) Administered   Fluad Quad(high Dose 65+) 09/25/2021   Influenza Split 09/25/2012   Influenza-Unspecified 09/25/2002, 10/26/2004, 10/29/2021, 09/26/2022   Moderna Sars-Covid-2 Vaccination 03/31/2020, 04/28/2020, 11/06/2020   PNEUMOCOCCAL CONJUGATE-20 01/02/2023   Pneumococcal Conjugate-13 04/10/2014   Pneumococcal Polysaccharide-23 01/16/2012   Pneumococcal-Unspecified 05/15/2006   Td 03/28/1990   Tdap 01/16/2012, 12/31/2021   Zoster Recombinant(Shingrix) 12/31/2021, 04/06/2022    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    Chronic GI losses, B12 Deficiency   Ascending aortic aneurysm (HCC) 06/08/2021   Benign essential hypertension    Benign prostatic hyperplasia 01/12/2022   Benign prostatic hyperplasia with lower urinary tract symptoms 01/19/2023   BPH with obstruction/lower urinary tract symptoms    Cardiac murmur 03/31/2021   Coronary atherosclerosis 06/08/2021   DOE (dyspnea on exertion)    Encounter for fitting and adjustment of hearing aid 04/19/2022   Encounter for immunization 04/19/2022   Essential (primary) hypertension 01/12/2022   Gastroesophageal reflux disease 01/12/2022   Hyperlipidemia    Idiopathic pulmonary fibrosis (HCC) 01/12/2022   Impacted cerumen, bilateral 04/19/2022   Kidney disease    Obstructive sleep apnea syndrome 04/30/2013   NPSG 01/2012:  AHI 27/hr with desat to 86%.   Other retention of urine 01/19/2023   Paroxysmal atrial fibrillation (HCC) 03/31/2021   Pernicious anemia    Pulmonary fibrosis (HCC)  03/31/2021   Seasonal allergies    Sensorineural hearing loss, bilateral 04/19/2022   Sleep apnea    Vitamin B12 deficiency     Tobacco History: Social History   Tobacco Use  Smoking Status Former   Current packs/day: 0.00   Average packs/day: 1.5 packs/day for 25.0 years (37.5 ttl pk-yrs)   Types: Cigarettes   Start date: 08/26/1958   Quit date: 08/27/1983   Years since quitting: 40.0  Smokeless Tobacco Never   Counseling given: Not Answered   Outpatient Medications Prior to Visit  Medication Sig Dispense Refill   alfuzosin (UROXATRAL) 10 MG 24 hr tablet Take 10 mg by mouth daily with breakfast.     amiodarone (PACERONE) 200 MG tablet Take 0.5 tablets (100 mg total) by mouth daily. 45 tablet 3   apixaban (ELIQUIS) 5 MG TABS tablet Take 1 tablet (5 mg total) by mouth 2 (two) times daily. 180 tablet 3   bethanechol (URECHOLINE) 50 MG tablet Take 50 mg by mouth 2 (two) times daily.     Cholecalciferol (D3 2000) 50 MCG (2000 UT) CAPS Take 2,000 Units by mouth daily at 12 noon.     Coenzyme Q10 (COQ10) 100 MG CAPS Take 100 mg by mouth daily.     finasteride (PROSCAR) 5 MG tablet Take 5 mg by mouth daily.  fluticasone (FLONASE) 50 MCG/ACT nasal spray Place 1 spray into both nostrils 2 (two) times daily.     methenamine (HIPREX) 1 g tablet Take 1 g by mouth every morning.     Multiple Vitamins-Minerals (VITAMIN D3 COMPLETE PO) Take 1 tablet by mouth daily.     pantoprazole (PROTONIX) 40 MG tablet Take 40 mg by mouth 2 (two) times daily.     Probiotic Product (PROBIOTIC DAILY PO) Take 1 tablet by mouth daily.     simvastatin (ZOCOR) 20 MG tablet Take 20 mg by mouth every evening.     sucralfate (CARAFATE) 1 GM/10ML suspension Take 1 g by mouth 2 (two) times daily.     telmisartan (MICARDIS) 20 MG tablet Take 10 mg by mouth daily.     valACYclovir (VALTREX) 500 MG tablet Take 500 mg by mouth 2 (two) times daily as needed for other (Fever blisters). Fever blisters     Nintedanib (OFEV)  100 MG CAPS Take 1 capsule (100 mg total) by mouth 2 (two) times daily. (Patient not taking: Reported on 09/21/2023) 60 capsule 1   Fluticasone-Umeclidin-Vilant (TRELEGY ELLIPTA) 100-62.5-25 MCG/ACT AEPB Inhale 1 puff into the lungs daily. (Patient not taking: Reported on 09/21/2023) 1 each 11   No facility-administered medications prior to visit.     Review of Systems:   Constitutional:   No  weight loss, night sweats,  Fevers, chills, fatigue, or  lassitude.  HEENT:   No headaches,  Difficulty swallowing,  Tooth/dental problems, or  Sore throat,                No sneezing, itching, ear ache, nasal congestion, post nasal drip,   CV:  No chest pain,  Orthopnea, PND, swelling in lower extremities, anasarca, dizziness, palpitations, syncope.   GI  No heartburn, indigestion, abdominal pain, nausea, vomiting, diarrhea, change in bowel habits, loss of appetite, bloody stools.   Resp:  No chest wall deformity  Skin: no rash or lesions.  GU: no dysuria, change in color of urine, no urgency or frequency.  No flank pain, no hematuria   MS:  No joint pain or swelling.  No decreased range of motion.  No back pain.    Physical Exam  BP 118/68 (BP Location: Left Arm, Patient Position: Sitting, Cuff Size: Normal)   Pulse 91   Temp 97.9 F (36.6 C) (Oral)   Ht 5\' 9"  (1.753 m)   Wt 176 lb 9.6 oz (80.1 kg)   SpO2 92%   BMI 26.08 kg/m   GEN: A/Ox3; pleasant , NAD, well nourished .elderly    HEENT:  /AT,  EACs-clear, TMs-wnl, NOSE-clear, THROAT-clear, no lesions, no postnasal drip or exudate noted.   NECK:  Supple w/ fair ROM; no JVD; normal carotid impulses w/o bruits; no thyromegaly or nodules palpated; no lymphadenopathy.    RESP BB crackles  no accessory muscle use, no dullness to percussion  CARD:  RRR, no m/r/g, no peripheral edema, pulses intact, no cyanosis or clubbing.  GI:   Soft & nt; nml bowel sounds; no organomegaly or masses detected.   Musco: Warm bil, no deformities  or joint swelling noted.   Neuro: alert, no focal deficits noted.    Skin: Warm, few scattered small red punctate lesions     Lab Results:  CBC     BNP No results found for: "BNP"  ProBNP   Imaging: No results found.  Administration History     None  Latest Ref Rng & Units 07/22/2022    1:45 PM 09/03/2021   11:44 AM  PFT Results  FVC-Pre L 3.37  3.73   FVC-Predicted Pre % 94  103   FVC-Post L 3.38  3.70   FVC-Predicted Post % 94  102   Pre FEV1/FVC % % 87  86   Post FEV1/FCV % % 88  87   FEV1-Pre L 2.93  3.20   FEV1-Predicted Pre % 116  125   FEV1-Post L 2.96  3.22   DLCO uncorrected ml/min/mmHg 10.29  13.51   DLCO UNC% % 45  59   DLCO corrected ml/min/mmHg 10.29  13.51   DLCO COR %Predicted % 45  59   DLVA Predicted % 62  70   TLC L 5.29  5.21   TLC % Predicted % 80  79   RV % Predicted % 58  56     No results found for: "NITRICOXIDE"      Assessment & Plan:   Idiopathic pulmonary fibrosis (HCC) IPF with UIP changes on CT scan.  Patient has been intolerant to Esbriet in the past.  Previously tried on lower dose Ofev.  But was having difficulties.  Unclear if rash was related to [facility] for now will remain off of this due to GI side effects.  On return visit can decide if we want to rechallenge at lower dose.  Check spirometry with [facility]CO on return visit.  Also need to continue to monitor closely as patient is on amiodarone  Plan  Patient Instructions  Remain off Trelegy and OFEV Begin Stiolto 2 puffs daily  Activity as tolerated.  Continue on Oxygen 2l/m At bedtime   If rash does not resolve need to see Primary MD.  Delsym 2 tsp Twice daily As needed  cough.  Follow up with Dr. Isaiah Serge or Janyth Riera NP in 2 months  with Spirometry with DLCO.  Please contact office for sooner follow up if symptoms do not improve or worsen or seek emergency care       Chronic respiratory failure with hypoxia (HCC) Patient remains on oxygen at bedtime with  his CPAP.  Continue follow-up with [facility] for sleep apnea  Diastolic heart failure (HCC) Diastolic heart failure -appears euvolemic on exam .continue on current regimen and follow up with cardiology  Rash Rash ? Etiology unclear . Seems to be resolving .  Would be unusual that this would be related to [facility] or Trelegy.  Rash does seem to be resolving.  Will remain off of Trelegy and Ofev for now.  If rash does not totally resolve or reappears he will need to follow-up with his primary care provider.  Emphysema lung (HCC) Emphysema complicated by pulmonary fibrosis.  Patient has significant symptom burden with shortness of breath and cough.  Most likely due to combination of both emphysema and pulmonary fibrosis.  However has had perceived benefit on Trelegy.  Unclear if patient's rash was a side effect of Trelegy.  For now remain off of Trelegy.  Will try Stiolto.  Check PFTs with DLCO return  Plan  Patient Instructions  Remain off Trelegy and OFEV Begin Stiolto 2 puffs daily  Activity as tolerated.  Continue on Oxygen 2l/m At bedtime   If rash does not resolve need to see Primary MD.  Delsym 2 tsp Twice daily As needed  cough.  Follow up with Dr. Isaiah Serge or Romelle Reiley NP in 2 months  with Spirometry with DLCO.  Please contact office for sooner follow  up if symptoms do not improve or worsen or seek emergency care         I spent   43 minutes dedicated to the care of this patient on the date of this encounter to include pre-visit review of records, face-to-face time with the patient discussing conditions above, post visit ordering of testing, clinical documentation with the electronic health record, making appropriate referrals as documented, and communicating necessary findings to members of the patients care team.   Rubye Oaks, NP 09/21/2023

## 2023-09-21 NOTE — Assessment & Plan Note (Signed)
Diastolic heart failure -appears euvolemic on exam .continue on current regimen and follow up with cardiology

## 2023-09-21 NOTE — Patient Instructions (Signed)
Remain off Trelegy and OFEV Begin Stiolto 2 puffs daily  Activity as tolerated.  Continue on Oxygen 2l/m At bedtime   If rash does not resolve need to see Primary MD.  Delsym 2 tsp Twice daily As needed  cough.  Follow up with Dr. Isaiah Serge or Keyante Durio NP in 2 months  with Spirometry with DLCO.  Please contact office for sooner follow up if symptoms do not improve or worsen or seek emergency care

## 2023-09-21 NOTE — Assessment & Plan Note (Signed)
Patient remains on oxygen at bedtime with his CPAP.  Continue follow-up with [facility] for sleep apnea

## 2023-09-21 NOTE — Assessment & Plan Note (Signed)
IPF with UIP changes on CT scan.  Patient has been intolerant to Esbriet in the past.  Previously tried on lower dose Ofev.  But was having difficulties.  Unclear if rash was related to [facility] for now will remain off of this due to GI side effects.  On return visit can decide if we want to rechallenge at lower dose.  Check spirometry with DLCO on return visit.  Also need to continue to monitor closely as patient is on amiodarone  Plan  Patient Instructions  Remain off Trelegy and OFEV Begin Stiolto 2 puffs daily  Activity as tolerated.  Continue on Oxygen 2l/m At bedtime   If rash does not resolve need to see Primary MD.  Delsym 2 tsp Twice daily As needed  cough.  Follow up with Dr. Isaiah Serge or Amanie Mcculley NP in 2 months  with Spirometry with DLCO.  Please contact office for sooner follow up if symptoms do not improve or worsen or seek emergency care

## 2023-09-22 ENCOUNTER — Other Ambulatory Visit: Payer: Self-pay | Admitting: Pulmonary Disease

## 2023-09-22 DIAGNOSIS — J84112 Idiopathic pulmonary fibrosis: Secondary | ICD-10-CM

## 2023-09-22 DIAGNOSIS — J849 Interstitial pulmonary disease, unspecified: Secondary | ICD-10-CM

## 2023-09-25 NOTE — Telephone Encounter (Signed)
Received refill request from [facility] for Ofev. Per review of OV on 09/21/23, patient is holding Ofev currently. ATC patient to confirm - unable to reach. Left VM that refill is being denied and if he is pllaning to restart, he will need to call clinic back to confirm this  Chesley Mires, PharmD, MPH, BCPS, CPP Clinical Pharmacist (Rheumatology and Pulmonology)

## 2023-09-26 DIAGNOSIS — E538 Deficiency of other specified B group vitamins: Secondary | ICD-10-CM | POA: Diagnosis not present

## 2023-09-26 DIAGNOSIS — R339 Retention of urine, unspecified: Secondary | ICD-10-CM | POA: Diagnosis not present

## 2023-09-26 DIAGNOSIS — N401 Enlarged prostate with lower urinary tract symptoms: Secondary | ICD-10-CM | POA: Diagnosis not present

## 2023-09-26 DIAGNOSIS — Z23 Encounter for immunization: Secondary | ICD-10-CM | POA: Diagnosis not present

## 2023-10-03 DIAGNOSIS — G4733 Obstructive sleep apnea (adult) (pediatric): Secondary | ICD-10-CM | POA: Diagnosis not present

## 2023-10-09 DIAGNOSIS — N401 Enlarged prostate with lower urinary tract symptoms: Secondary | ICD-10-CM | POA: Diagnosis not present

## 2023-10-11 ENCOUNTER — Telehealth: Payer: Self-pay | Admitting: Cardiology

## 2023-10-11 DIAGNOSIS — H02105 Unspecified ectropion of left lower eyelid: Secondary | ICD-10-CM | POA: Diagnosis not present

## 2023-10-11 DIAGNOSIS — H02401 Unspecified ptosis of right eyelid: Secondary | ICD-10-CM | POA: Diagnosis not present

## 2023-10-11 NOTE — Telephone Encounter (Signed)
[facility] called and said that they just sent a referral via fax and they want to know if you if received it. Please call back at [phone]

## 2023-10-12 ENCOUNTER — Telehealth: Payer: Self-pay

## 2023-10-12 NOTE — Telephone Encounter (Signed)
Pre-operative Risk Assessment    Patient Name: Vincent Wiley  DOB: 1941-12-23 MRN: [id number]      Request for Surgical Clearance    Procedure:   ectropion repair  Date of Surgery:  Clearance 10/27/23                                 Surgeon:  Dr. Rozanna Box Surgeon's Group or Practice Name:  [facility] Phone number:   Fax number:  [phone]  4. What type of clearance is requested?  Medical or Cardiac Clearance only?  Pharmacy Clearance Only (Request is to hold medication only)?  Or Both?  Press F2 and select the clearance requested.  If both are needed, select both from the drop down list.     :1}  Type of Clearance Requested:   - Pharmacy:  Hold Apixaban (Eliquis) 2 days prior to surgery   Type of Anesthesia:  Not Indicated   Additional requests/questions:    Signed, Dione Housekeeper   10/12/2023, 10:38 AM

## 2023-10-12 NOTE — Telephone Encounter (Signed)
Patient with diagnosis of afib on Eliquis for anticoagulation.    Procedure: ectropion repair Date of procedure: 10/27/23  CHA2DS2-VASc Score = 5  This indicates a 7.2% annual risk of stroke. The patient's score is based upon: CHF History: 1 HTN History: 1 Diabetes History: 0 Stroke History: 0 Vascular Disease History: 1 Age Score: 2 Gender Score: 0   CrCl 20mL/min Platelet count 213K  Patient can hold Eliquis for 2 days prior to procedure as requested.    **This guidance is not considered finalized until pre-operative APP has relayed final recommendations.**

## 2023-10-12 NOTE — Telephone Encounter (Signed)
Patient Name: Vincent Wiley  DOB: Jun 29, 1941 MRN: [id number]  Primary Cardiologist: None  Chart reviewed as part of pre-operative protocol coverage. Pre-op clearance already addressed by colleagues in earlier phone notes. To summarize recommendations:  -Patient can hold Eliquis for 2 days prior to procedure as requested.  Please resume when medically safe to do so.  No medical clearance was requested.  Will route this bundled recommendation to requesting provider via Epic fax function and remove from pre-op pool. Please call with questions.  Sharlene Dory, PA-C 10/12/2023, 12:16 PM

## 2023-10-12 NOTE — Telephone Encounter (Signed)
Left message at [facility] that fax had been received.

## 2023-10-24 ENCOUNTER — Ambulatory Visit: Payer: Medicare PPO | Attending: Cardiology | Admitting: Cardiology

## 2023-10-24 ENCOUNTER — Encounter: Payer: Self-pay | Admitting: Cardiology

## 2023-10-24 VITALS — BP 110/68 | HR 81 | Ht 69.0 in | Wt 177.0 lb

## 2023-10-24 DIAGNOSIS — I480 Paroxysmal atrial fibrillation: Secondary | ICD-10-CM

## 2023-10-24 DIAGNOSIS — Q2111 Secundum atrial septal defect: Secondary | ICD-10-CM

## 2023-10-24 DIAGNOSIS — I7121 Aneurysm of the ascending aorta, without rupture: Secondary | ICD-10-CM | POA: Diagnosis not present

## 2023-10-24 DIAGNOSIS — I2510 Atherosclerotic heart disease of native coronary artery without angina pectoris: Secondary | ICD-10-CM | POA: Diagnosis not present

## 2023-10-24 DIAGNOSIS — I10 Essential (primary) hypertension: Secondary | ICD-10-CM

## 2023-10-24 NOTE — Patient Instructions (Signed)
Medication Instructions:  Your physician recommends that you continue on your current medications as directed. Please refer to the Current Medication list given to you today.  *If you need a refill on your cardiac medications before your next appointment, please call your pharmacy*   Lab Work: None Ordered If you have labs (blood work) drawn today and your tests are completely normal, you will receive your results only by: MyChart Message (if you have MyChart) OR A paper copy in the mail If you have any lab test that is abnormal or we need to change your treatment, we will call you to review the results.   Testing/Procedures: None Ordered   Follow-Up: At [facility], you and your health needs are our priority.  As part of our continuing mission to provide you with exceptional heart care, we have created designated Provider Care Teams.  These Care Teams include your primary Cardiologist (physician) and Advanced Practice Providers (APPs -  Physician Assistants and Nurse Practitioners) who all work together to provide you with the care you need, when you need it.  We recommend signing up for the patient portal called "MyChart".  Sign up information is provided on this After Visit Summary.  MyChart is used to connect with patients for Virtual Visits (Telemedicine).  Patients are able to view lab/test results, encounter notes, upcoming appointments, etc.  Non-urgent messages can be sent to your provider as well.   To learn more about what you can do with MyChart, go to [email or web address].    Your next appointment:   9 month(s)  The format for your next appointment:   In Person  Provider:   Rajan Revankar, MD    Other Instructions NA  

## 2023-10-24 NOTE — Progress Notes (Signed)
Cardiology Office Note:    Date:  10/24/2023   ID:  Vincent Wiley, DOB 07-15-1941, MRN [id number]  PCP:  Street, Stephanie Coup, MD  Cardiologist:  Garwin Brothers, MD   Referring MD: [address] Stephanie Coup, *    ASSESSMENT:    1. Atherosclerosis of native coronary artery of native heart without angina pectoris   2. Benign essential hypertension   3. Secundum atrial septal defect   4. Aneurysm of ascending aorta without rupture (HCC)   5. Paroxysmal atrial fibrillation (HCC)    PLAN:    In order of problems listed above:  Coronary atherosclerosis: Secondary prevention stressed to the patient.  Importance of compliance with diet medication stressed and vocalized understanding.  He was advised to ambulate to the best of his ability.  Given his pulmonary issues. Essential hypertension: Blood pressure stable and diet was emphasized.  Lifestyle modification urged. Mixed dyslipidemia: On lipid-lowering medications followed by primary care.  Lipids noted and discussed with the patient at length. Pulmonary fibrosis: These issues are handled by pulmonologist and he is constantly and closely monitored by them.  He is a very compliant gentleman. I told him to be ambulatory to the best of his ability and he promises to do so.  I encouraged him against weight gain. Patient will be seen in follow-up appointment in 6 months or earlier if the patient has any concerns.    Medication Adjustments/Labs and Tests Ordered: Current medicines are reviewed at length with the patient today.  Concerns regarding medicines are outlined above.  Orders Placed This Encounter  Procedures   EKG 12-Lead   No orders of the defined types were placed in this encounter.    No chief complaint on file.    History of Present Illness:    Vincent Wiley is a 82 y.o. male.  Patient has past medical history of coronary atherosclerosis, essential hypertension, mixed dyslipidemia.  He leads a sedentary lifestyle.  He  is generally active.  His lung problems are a challenge for him.  At the time of my evaluation, the patient is alert awake oriented and in no distress.  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    Chronic GI losses, B12 Deficiency   Ascending aortic aneurysm (HCC) 06/08/2021   Benign essential hypertension    Benign prostatic hyperplasia 01/12/2022   Benign prostatic hyperplasia with lower urinary tract symptoms 01/19/2023   BPH with obstruction/lower urinary tract symptoms    Cardiac murmur 03/31/2021   Coronary atherosclerosis 06/08/2021   DOE (dyspnea on exertion)    Encounter for fitting and adjustment of hearing aid 04/19/2022   Encounter for immunization 04/19/2022   Essential (primary) hypertension 01/12/2022   Gastroesophageal reflux disease 01/12/2022   Hyperlipidemia    Idiopathic pulmonary fibrosis (HCC) 01/12/2022   Impacted cerumen, bilateral 04/19/2022   Kidney disease    Obstructive sleep apnea syndrome 04/30/2013   NPSG 01/2012:  AHI 27/hr with desat to 86%.   Other retention of urine 01/19/2023   Paroxysmal atrial fibrillation (HCC) 03/31/2021   Pernicious anemia    Pulmonary fibrosis (HCC) 03/31/2021   Seasonal allergies    Sensorineural hearing loss, bilateral 04/19/2022   Sleep apnea    Vitamin B12 deficiency     Past Surgical History:  Procedure Laterality Date   EYE SURGERY Bilateral    right eye realignment//cataract left eye   HERNIA REPAIR  umbilical   MULTIPLE TOOTH EXTRACTIONS     REPLACEMENT TOTAL KNEE Bilateral     Current Medications: Current Meds  Medication Sig   alfuzosin (UROXATRAL) 10 MG 24 hr tablet Take 10 mg by mouth daily with breakfast.   amiodarone (PACERONE) 200 MG tablet Take 0.5 tablets (100 mg total) by mouth daily.   apixaban (ELIQUIS) 5 MG TABS tablet Take 1 tablet (5 mg total) by mouth 2 (two) times daily.   bethanechol (URECHOLINE) 50 MG  tablet Take 50 mg by mouth 2 (two) times daily.   Cholecalciferol (D3 2000) 50 MCG (2000 UT) CAPS Take 2,000 Units by mouth daily at 12 noon.   Coenzyme Q10 (COQ10) 100 MG CAPS Take 100 mg by mouth daily.   finasteride (PROSCAR) 5 MG tablet Take 5 mg by mouth daily.   fluticasone (FLONASE) 50 MCG/ACT nasal spray Place 1 spray into both nostrils 2 (two) times daily.   methenamine (HIPREX) 1 g tablet Take 1 g by mouth every morning.   Multiple Vitamins-Minerals (VITAMIN D3 COMPLETE PO) Take 1 tablet by mouth daily.   Nintedanib (OFEV) 100 MG CAPS Take 1 capsule (100 mg total) by mouth 2 (two) times daily.   pantoprazole (PROTONIX) 40 MG tablet Take 40 mg by mouth 2 (two) times daily.   Probiotic Product (PROBIOTIC DAILY PO) Take 1 tablet by mouth daily.   simvastatin (ZOCOR) 20 MG tablet Take 20 mg by mouth every evening.   sucralfate (CARAFATE) 1 GM/10ML suspension Take 1 g by mouth 2 (two) times daily.   telmisartan (MICARDIS) 20 MG tablet Take 10 mg by mouth daily.   Tiotropium Bromide-Olodaterol (STIOLTO RESPIMAT) 2.5-2.5 MCG/ACT AERS Inhale 2 puffs into the lungs daily.   valACYclovir (VALTREX) 500 MG tablet Take 500 mg by mouth 2 (two) times daily as needed for other (Fever blisters). Fever blisters   [DISCONTINUED] Tiotropium Bromide-Olodaterol (STIOLTO RESPIMAT) 2.5-2.5 MCG/ACT AERS Inhale 2 puffs into the lungs daily.     Allergies:   Sulfamethoxazole, Amlodipine, Ceftriaxone, Ciprofloxacin, Cortisone acetate [cortisone], Elemental sulfur, Flomax [tamsulosin], Hibiclens [chlorhexidine gluconate], Lisinopril, Other, Penicillins, Probiata [lactobacillus], Rocephin [ceftriaxone sodium in dextrose], and Sulfa antibiotics   Social History   Socioeconomic History   Marital status: Unknown    Spouse name: Not on file   Number of children: Not on file   Years of education: Not on file   Highest education level: Not on file  Occupational History   Occupation: retired  Tobacco Use    Smoking status: Former    Current packs/day: 0.00    Average packs/day: 1.5 packs/day for 25.0 years (37.5 ttl pk-yrs)    Types: Cigarettes    Start date: 08/26/1958    Quit date: 08/27/1983    Years since quitting: 40.1   Smokeless tobacco: Never  Substance and Sexual Activity   Alcohol use: No   Drug use: No   Sexual activity: Not on file  Other Topics Concern   Not on file  Social History Narrative   Not on file   Social Determinants of Health   Financial Resource Strain: Not on file  Food Insecurity: Not on file  Transportation Needs: Not on file  Physical Activity: Not on file  Stress: Not on file  Social Connections: Not on file     Family History: The patient's family history includes AAA (abdominal aortic aneurysm) in his father; Aneurysm in his mother; Breast cancer in his sister and sister; Heart attack in his father.  ROS:   Please  see the history of present illness.    All other systems reviewed and are negative.  EKGs/Labs/Other Studies Reviewed:    The following studies were reviewed today: .Marland KitchenEKG Interpretation Date/Time:  Tuesday October 24 2023 11:34:19 EDT Ventricular Rate:  81 PR Interval:  170 QRS Duration:  104 QT Interval:  400 QTC Calculation: 464 R Axis:   12  Text Interpretation: Normal sinus rhythm Normal ECG No previous ECGs available Confirmed by Belva Crome [phone]) on 10/24/2023 11:46:08 AM     Recent Labs: 01/25/2023: TSH 1.600 08/09/2023: ALT 10; BUN 22; Creatinine, Ser 1.24; NT-Pro BNP 359; Potassium 4.1; Sodium 139  Recent Lipid Panel    Component Value Date/Time   CHOL 124 01/25/2023 1142   TRIG 70 01/25/2023 1142   HDL 51 01/25/2023 1142   CHOLHDL 2.4 01/25/2023 1142   LDLCALC 59 01/25/2023 1142    Physical Exam:    VS:  BP 110/68   Pulse 81   Ht 5\' 9"  (1.753 m)   Wt 177 lb (80.3 kg)   SpO2 96%   BMI 26.14 kg/m     Wt Readings from Last 3 Encounters:  10/24/23 177 lb (80.3 kg)  09/21/23 176 lb 9.6 oz (80.1 kg)   08/09/23 176 lb 9.6 oz (80.1 kg)     GEN: Patient is in no acute distress HEENT: Normal NECK: No JVD; No carotid bruits LYMPHATICS: No lymphadenopathy CARDIAC: Hear sounds regular, 2/6 systolic murmur at the apex. RESPIRATORY:  Clear to auscultation without rales, wheezing or rhonchi  ABDOMEN: Soft, non-tender, non-distended MUSCULOSKELETAL:  No edema; No deformity  SKIN: Warm and dry NEUROLOGIC:  Alert and oriented x 3 PSYCHIATRIC:  Normal affect   Signed, Garwin Brothers, MD  10/24/2023 11:45 AM    [facility]

## 2023-10-27 DIAGNOSIS — R339 Retention of urine, unspecified: Secondary | ICD-10-CM | POA: Diagnosis not present

## 2023-10-27 DIAGNOSIS — H02115 Cicatricial ectropion of left lower eyelid: Secondary | ICD-10-CM | POA: Diagnosis not present

## 2023-10-27 DIAGNOSIS — H02105 Unspecified ectropion of left lower eyelid: Secondary | ICD-10-CM | POA: Diagnosis not present

## 2023-10-27 DIAGNOSIS — N401 Enlarged prostate with lower urinary tract symptoms: Secondary | ICD-10-CM | POA: Diagnosis not present

## 2023-10-27 DIAGNOSIS — H02401 Unspecified ptosis of right eyelid: Secondary | ICD-10-CM | POA: Diagnosis not present

## 2023-11-03 DIAGNOSIS — G4733 Obstructive sleep apnea (adult) (pediatric): Secondary | ICD-10-CM | POA: Diagnosis not present

## 2023-11-27 DIAGNOSIS — E538 Deficiency of other specified B group vitamins: Secondary | ICD-10-CM | POA: Diagnosis not present

## 2023-11-30 DIAGNOSIS — N401 Enlarged prostate with lower urinary tract symptoms: Secondary | ICD-10-CM | POA: Diagnosis not present

## 2023-11-30 DIAGNOSIS — R339 Retention of urine, unspecified: Secondary | ICD-10-CM | POA: Diagnosis not present

## 2023-12-03 DIAGNOSIS — G4733 Obstructive sleep apnea (adult) (pediatric): Secondary | ICD-10-CM | POA: Diagnosis not present

## 2023-12-15 ENCOUNTER — Ambulatory Visit: Payer: Medicare PPO | Admitting: Adult Health

## 2023-12-15 ENCOUNTER — Ambulatory Visit: Payer: Medicare PPO | Admitting: Pulmonary Disease

## 2023-12-15 ENCOUNTER — Encounter: Payer: Self-pay | Admitting: Adult Health

## 2023-12-15 VITALS — BP 130/70 | HR 70 | Temp 97.5°F | Ht 67.5 in | Wt 178.0 lb

## 2023-12-15 DIAGNOSIS — J84112 Idiopathic pulmonary fibrosis: Secondary | ICD-10-CM | POA: Diagnosis not present

## 2023-12-15 DIAGNOSIS — J9611 Chronic respiratory failure with hypoxia: Secondary | ICD-10-CM

## 2023-12-15 DIAGNOSIS — Z5181 Encounter for therapeutic drug level monitoring: Secondary | ICD-10-CM

## 2023-12-15 DIAGNOSIS — J439 Emphysema, unspecified: Secondary | ICD-10-CM

## 2023-12-15 DIAGNOSIS — R21 Rash and other nonspecific skin eruption: Secondary | ICD-10-CM

## 2023-12-15 LAB — PULMONARY FUNCTION TEST
DL/VA % pred: 69 %
DL/VA: 2.71 ml/min/mmHg/L
DLCO cor % pred: 50 %
DLCO cor: 11.2 ml/min/mmHg
DLCO unc % pred: 50 %
DLCO unc: 11.2 ml/min/mmHg
FEF 25-75 Pre: 3.92 L/s
FEF2575-%Pred-Pre: 238 %
FEV1-%Pred-Pre: 109 %
FEV1-Pre: 2.7 L
FEV1FVC-%Pred-Pre: 119 %
FEV6-%Pred-Pre: 97 %
FEV6-Pre: 3.17 L
FEV6FVC-%Pred-Pre: 108 %
FVC-%Pred-Pre: 89 %
FVC-Pre: 3.18 L
Pre FEV1/FVC ratio: 85 %
Pre FEV6/FVC Ratio: 100 %

## 2023-12-15 NOTE — Progress Notes (Signed)
@Patient  ID: Vincent Wiley, male    DOB: [birth year], 82 y.o.   MRN: [id number]  Chief Complaint  Patient presents with   Follow-up   Discussed the use of AI scribe software for clinical note transcription with the patient, who gave verbal consent to proceed.   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  No feather pillows or comforters Smoking history: 26-pack-year smoker.  Quit in 1984 Travel history: No significant travel history Relevant family history: No family history of lung disease  SH: Lives at home with wife, drives , light activities, O2 At bedtime . Retired -   High-res CT 03/28/2021-pulmonary fibrosis in UIP pattern.  Honeycombing present, bronchial wall thickening, emphysema, coronary artery disease, aortic aneurysm, enlargement of pulmonary artery  CT chest 05/03/2021-redemonstrated pulmonary fibrosis   High-resolution CT 05/09/2022-UIP pulmonary fibrosis with progression.   High resolution CT 08/02/2023-stable pattern of UIP pulmonary fibrosis.       Cardiac: Echocardiogram 04/03/2019- LVEF 60 to 65%, mild concentric LVH, grade 3 diastolic dysfunction, mildly elevated PA systolic pressure, ASD-secundum type    PFTs: 09/03/2021 FVC 2.70 [102%], FEV1 3.22 [126%], F/F 87, TLC 5.21 [79%], DLCO 13.51 [59%] Severe diffusion defect   07/22/2022 FVC 3.28 [94%], FEV1 2.96 [117%], F/F 88, TLC 5.29 [80%], DLCO 10.29 [45%] Severe diffusion defect   Labs: CTD serologies 09/03/2021- negative   Hepatic panel from primary care dated 07/06/2022 Within normal limits except for low  albumin of 3.3   Received hepatic panel from primary care dated 11/09/2022 Alk phos is borderline low at 37, rest of hepatic panel is within normal limits.   CMP 01/25/23-significant only for alk phos of 43   Cardiac Echocardiogram 04/02/2021-LVEF 60-65%, normal RV systolic size and function.  Mildly elevated PA systolic pressure.   Sleep: Overnight oximetry on CPAP and oxygen dated 04/21/2022 Duration of study 8 hours 46 minutes Nadir O2 sat of 90%. Oxygen saturations remained around 96% for majority of the study   12/15/2023 Follow up : IPF , emphysema, O2 RF  Patient returns for a 10-month follow-up.  He has a history of emphysema and pulmonary fibrosis.  Patient was seen last visit with the development of a rash with scattered red spots along the upper and lower extremities and torso.  Patient was taken off of Ofev and Trelegy.  Changed over to [employer].  He does feel that Stiolto is working well.  Since last visit rash is totally resolved.   The patient's breathing has remained stable, with no perceived worsening. Pulmonary function tests done on December 15, 2023 showed stable results with a slight improvement in diffusing capacity FVC 89%, FEV1 109%, ratio 85, DLCO is at 50%.  Previously was at 45%.  A CT scan from earlier in the year also showed stable findings.  The patient uses oxygen at night and occasionally during the day when exerting themselves. They monitor their oxygen levels, which typically remain around 94-95%, but can drop to 88-89% with heavier activities   The patient also reports a daily intermittent dry cough.  Is some better since last visit.    Patient does have a  history of A-fib and is on amiodarone and Eliquis   The patient lives independently, maintains an active lifestyle, and continues to drive. Despite their lung and heart conditions, they are able to perform light chores and activities, taking breaks as needed.        Allergies  Allergen Reactions    Sulfamethoxazole Other (See Comments)    Severe reaction   Amlodipine     Tingling hands   Ceftriaxone     Other reaction(s): Unknown   Ciprofloxacin Itching    Edema of ear   Cortisone Acetate [Cortisone] Hives    Hive reaction to injectable Kenalog and topical reaction to Cortisporin   Elemental Sulfur    Flomax [Tamsulosin] Other (See Comments)    Dizziness, blurred vision and Headache   Hibiclens [Chlorhexidine Gluconate]    Lisinopril    Other     Steroid Injections   Penicillins    Probiata [Lactobacillus]    Rocephin [Ceftriaxone Sodium In Dextrose]    Sulfa Antibiotics     Other reaction(s): Unknown    Immunization History  Administered Date(s) Administered   Fluad Quad(high Dose 65+) 09/25/2021   Influenza Split 09/25/2012   Influenza-Unspecified 09/25/2002, 10/26/2004, 10/29/2021, 09/26/2022   Moderna Sars-Covid-2 Vaccination 03/31/2020, 04/28/2020, 11/06/2020   PNEUMOCOCCAL CONJUGATE-20 01/02/2023   Pneumococcal Conjugate-13 04/10/2014   Pneumococcal Polysaccharide-23 01/16/2012   Pneumococcal-Unspecified 05/15/2006   Td 03/28/1990   Tdap 01/16/2012, 12/31/2021   Zoster Recombinant(Shingrix) 12/31/2021, 04/06/2022    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    Chronic GI losses, B12 Deficiency   Ascending aortic aneurysm (HCC) 06/08/2021   Benign essential hypertension    Benign prostatic hyperplasia 01/12/2022   Benign prostatic hyperplasia with lower urinary tract symptoms 01/19/2023   BPH with obstruction/lower urinary tract symptoms    Cardiac murmur 03/31/2021   Coronary atherosclerosis 06/08/2021   DOE (dyspnea on exertion)    Encounter for fitting and adjustment of hearing aid 04/19/2022   Encounter for immunization 04/19/2022   Essential (primary) hypertension 01/12/2022   Gastroesophageal reflux disease 01/12/2022   Hyperlipidemia    Idiopathic pulmonary  fibrosis (HCC) 01/12/2022   Impacted cerumen, bilateral 04/19/2022   Kidney disease    Obstructive sleep apnea syndrome 04/30/2013   NPSG 01/2012:  AHI 27/hr with desat to 86%.   Other retention of urine 01/19/2023   Paroxysmal atrial fibrillation (HCC) 03/31/2021   Pernicious anemia    Pulmonary fibrosis (HCC) 03/31/2021   Seasonal allergies    Sensorineural hearing loss, bilateral 04/19/2022   Sleep apnea    Vitamin B12 deficiency     Tobacco History: Social History   Tobacco Use  Smoking Status Former   Current packs/day: 0.00   Average packs/day: 1.5 packs/day for 25.0 years (37.5 ttl pk-yrs)   Types: Cigarettes   Start date: 08/26/1958   Quit date: 08/27/1983   Years since quitting: 40.3  Smokeless Tobacco Never   Counseling given: Not Answered   Outpatient Medications Prior to Visit  Medication Sig Dispense Refill   alfuzosin (UROXATRAL) 10 MG 24 hr tablet Take 10 mg by mouth daily with breakfast.     amiodarone (PACERONE) 200 MG tablet Take 0.5 tablets (100 mg total) by mouth daily. 45 tablet 3   apixaban (ELIQUIS) 5 MG TABS tablet Take 1 tablet (5 mg total) by mouth 2 (two) times daily. 180 tablet 3   bethanechol (URECHOLINE) 50 MG tablet  Take 50 mg by mouth 2 (two) times daily.     Cholecalciferol (D3 2000) 50 MCG (2000 UT) CAPS Take 2,000 Units by mouth daily at 12 noon.     Coenzyme Q10 (COQ10) 100 MG CAPS Take 100 mg by mouth daily.     finasteride (PROSCAR) 5 MG tablet Take 5 mg by mouth daily.     fluticasone (FLONASE) 50 MCG/ACT nasal spray Place 1 spray into both nostrils 2 (two) times daily.     methenamine (HIPREX) 1 g tablet Take 1 g by mouth every morning.     Multiple Vitamins-Minerals (VITAMIN D3 COMPLETE PO) Take 1 tablet by mouth daily.     Nintedanib (OFEV) 100 MG CAPS Take 1 capsule (100 mg total) by mouth 2 (two) times daily. 60 capsule 1   pantoprazole (PROTONIX) 40 MG tablet Take 40 mg by mouth 2 (two) times daily.     Probiotic Product (PROBIOTIC  DAILY PO) Take 1 tablet by mouth daily.     simvastatin (ZOCOR) 20 MG tablet Take 20 mg by mouth every evening.     sucralfate (CARAFATE) 1 GM/10ML suspension Take 1 g by mouth 2 (two) times daily.     telmisartan (MICARDIS) 20 MG tablet Take 10 mg by mouth daily.     Tiotropium Bromide-Olodaterol (STIOLTO RESPIMAT) 2.5-2.5 MCG/ACT AERS Inhale 2 puffs into the lungs daily. 3 each 3   valACYclovir (VALTREX) 500 MG tablet Take 500 mg by mouth 2 (two) times daily as needed for other (Fever blisters). Fever blisters     No facility-administered medications prior to visit.     Review of Systems:   Constitutional:   No  weight loss, night sweats,  Fevers, chills, +fatigue, or  lassitude.  HEENT:   No headaches,  Difficulty swallowing,  Tooth/dental problems, or  Sore throat,                No sneezing, itching, ear ache, nasal congestion, post nasal drip,   CV:  No chest pain,  Orthopnea, PND, swelling in lower extremities, anasarca, dizziness, palpitations, syncope.   GI  No heartburn, indigestion, abdominal pain, nausea, vomiting, diarrhea, change in bowel habits, loss of appetite, bloody stools.   Resp: .  No chest wall deformity  Skin: no rash or lesions.  GU: no dysuria, change in color of urine, no urgency or frequency.  No flank pain, no hematuria   MS:  No joint pain or swelling.  No decreased range of motion.  No back pain.    Physical Exam  BP 130/70 (BP Location: Left Arm, Patient Position: Sitting, Cuff Size: Normal)   Pulse 70   Temp (!) 97.5 F (36.4 C) (Oral)   Ht 5' 7.5" (1.715 m)   Wt 178 lb (80.7 kg)   SpO2 92%   BMI 27.47 kg/m   GEN: A/Ox3; pleasant , NAD, well nourished    HEENT:  [place]/AT,  EACs-clear, TMs-wnl, NOSE-clear, THROAT-clear, no lesions, no postnasal drip or exudate noted.   NECK:  Supple w/ fair ROM; no JVD; normal carotid impulses w/o bruits; no thyromegaly or nodules palpated; no lymphadenopathy.    RESP bibasilar crackles no accessory  muscle use, no dullness to percussion  CARD:  RRR, no m/r/g, no peripheral edema, pulses intact, no cyanosis or clubbing.  GI:   Soft & nt; nml bowel sounds; no organomegaly or masses detected.   Musco: Warm bil, no deformities or joint swelling noted.   Neuro: alert, no focal deficits noted.  Skin: Warm, no lesions or rashes    Lab Results:  CBC   BMET   BNP   Imaging: No results found.  Administration History     None          Latest Ref Rng & Units 12/15/2023    9:11 AM 07/22/2022    1:45 PM 09/03/2021   11:44 AM  PFT Results  FVC-Pre L 3.18  P 3.37  3.73   FVC-Predicted Pre % 89  P 94  103   FVC-Post L  3.38  3.70   FVC-Predicted Post %  94  102   Pre FEV1/FVC % % 85  P 87  86   Post FEV1/FCV % %  88  87   FEV1-Pre L 2.70  P 2.93  3.20   FEV1-Predicted Pre % 109  P 116  125   FEV1-Post L  2.96  3.22   DLCO uncorrected ml/min/mmHg 11.20  P 10.29  13.51   DLCO UNC% % 50  P 45  59   DLCO corrected ml/min/mmHg 11.20  P 10.29  13.51   DLCO COR %Predicted % 50  P 45  59   DLVA Predicted % 69  P 62  70   TLC L  5.29  5.21   TLC % Predicted %  80  79   RV % Predicted %  58  56     P Preliminary result    No results found for: "NITRICOXIDE"      Assessment & Plan:  Assessment and Plan    Pulmonary Fibrosis- Probable IPF with UIP changes on CT chest.  Patient was intolerant to Esbriet in the past due to GI side effects.  Was maintaining OFEV at lower dosing.  Patient did develop a rash while taking this along with Trelegy.  His rash entirely resolved.  Will rechallenge with OFEV at lower dose beginning with 100 mg once daily for 1 to 2 weeks then increase to twice daily.  Patient is advised if his rash returns he is to immediately discontinue. PFT shows stable lung function.  He will need to return in 6 weeks to have liver function testing.  Emphysema Emphysema-continue on Stiolto.  Does have perceived benefit.  Breathing test results show  stability. They will continue the Stiolto inhaler, monitor symptoms, and use Delsym as needed for cough.  Chronic respiratory failure continue on oxygen 2 L at bedtime.  General Health Maintenance Vaccines up-to-date  History of A-fib continue follow-up with cardiology.  Continue to monitor closely as he is on amiodarone.  Follow-up They will return for labs in six weeks and have a follow-up appointment in three months.          Rubye Oaks, NP 12/15/2023

## 2023-12-15 NOTE — Progress Notes (Signed)
Spirometry/DLCO performed today. 

## 2023-12-15 NOTE — Patient Instructions (Signed)
Spirometry/DLCO performed today. 

## 2023-12-15 NOTE — Patient Instructions (Addendum)
Continue on Stiolto 2 puffs daily  Activity as tolerated.  Continue on Oxygen 2l/m At bedtime   Delsym 2 tsp Twice daily As needed  cough.  Restart Ofev 100mg   daily for 1 week then twice daily . (If rash returns stop immediately and call our office)  Return to office for lab only in 6 weeks  Follow up with Dr. Isaiah Serge in 3  months.   Please contact office for sooner follow up if symptoms do not improve or worsen or seek emergency care

## 2023-12-21 DIAGNOSIS — G4733 Obstructive sleep apnea (adult) (pediatric): Secondary | ICD-10-CM | POA: Diagnosis not present

## 2023-12-26 ENCOUNTER — Other Ambulatory Visit: Payer: Self-pay | Admitting: Cardiology

## 2023-12-26 NOTE — Telephone Encounter (Signed)
Pt last saw Dr Tomie China 10/24/23, last labs 08/09/23 Creat 1.24, age 82, weight 80.7kg, based on specified criteria pt is on appropriate dosage of Eliquis 5mg  BID for afib.  Will refill rx.

## 2023-12-28 DIAGNOSIS — D519 Vitamin B12 deficiency anemia, unspecified: Secondary | ICD-10-CM | POA: Diagnosis not present

## 2023-12-28 DIAGNOSIS — N401 Enlarged prostate with lower urinary tract symptoms: Secondary | ICD-10-CM | POA: Diagnosis not present

## 2023-12-28 DIAGNOSIS — R339 Retention of urine, unspecified: Secondary | ICD-10-CM | POA: Diagnosis not present

## 2024-01-03 DIAGNOSIS — G4733 Obstructive sleep apnea (adult) (pediatric): Secondary | ICD-10-CM | POA: Diagnosis not present

## 2024-01-15 ENCOUNTER — Telehealth: Payer: Self-pay | Admitting: Pulmonary Disease

## 2024-01-15 DIAGNOSIS — J849 Interstitial pulmonary disease, unspecified: Secondary | ICD-10-CM

## 2024-01-15 DIAGNOSIS — Z5181 Encounter for therapeutic drug level monitoring: Secondary | ICD-10-CM

## 2024-01-15 DIAGNOSIS — J84112 Idiopathic pulmonary fibrosis: Secondary | ICD-10-CM

## 2024-01-15 NOTE — Telephone Encounter (Signed)
PT is out of Ofev. Pharm is [facility] (mail order) . Pharm has been trying to contact us.

## 2024-01-17 DIAGNOSIS — Z5181 Encounter for therapeutic drug level monitoring: Secondary | ICD-10-CM | POA: Diagnosis not present

## 2024-01-17 DIAGNOSIS — J84112 Idiopathic pulmonary fibrosis: Secondary | ICD-10-CM | POA: Diagnosis not present

## 2024-01-17 NOTE — Telephone Encounter (Signed)
Patient will need repeat LFTs. Order placed for [facility]. Patient will have lab drawn today. Will send Ofev refill once labs results  Chesley Mires, PharmD, MPH, BCPS, CPP Clinical Pharmacist (Rheumatology and Pulmonology)

## 2024-01-17 NOTE — Telephone Encounter (Signed)
NFN 

## 2024-01-18 LAB — HEPATIC FUNCTION PANEL
ALT: 12 [IU]/L (ref 0–44)
AST: 14 [IU]/L (ref 0–40)
Albumin: 4 g/dL (ref 3.7–4.7)
Alkaline Phosphatase: 55 [IU]/L (ref 44–121)
Bilirubin Total: 0.4 mg/dL (ref 0.0–1.2)
Bilirubin, Direct: 0.16 mg/dL (ref 0.00–0.40)
Total Protein: 6.9 g/dL (ref 6.0–8.5)

## 2024-01-19 MED ORDER — OFEV 100 MG PO CAPS: 1.0000 | ORAL_CAPSULE | Freq: Two times a day (BID) | ORAL | 1 refills | Status: DC

## 2024-01-19 NOTE — Telephone Encounter (Signed)
LFTs wnl on 01/17/2024  Refill for Ofev 100mg  twice daily sent to [facility] today  Chesley Mires, PharmD, MPH, BCPS, CPP Clinical Pharmacist (Rheumatology and Pulmonology)

## 2024-01-19 NOTE — Addendum Note (Signed)
Addended by: Murrell Redden on: 01/19/2024 11:23 AM   Modules accepted: Orders

## 2024-01-29 DIAGNOSIS — R339 Retention of urine, unspecified: Secondary | ICD-10-CM | POA: Diagnosis not present

## 2024-01-29 DIAGNOSIS — D519 Vitamin B12 deficiency anemia, unspecified: Secondary | ICD-10-CM | POA: Diagnosis not present

## 2024-01-29 DIAGNOSIS — N401 Enlarged prostate with lower urinary tract symptoms: Secondary | ICD-10-CM | POA: Diagnosis not present

## 2024-01-31 ENCOUNTER — Other Ambulatory Visit: Payer: Self-pay | Admitting: Cardiology

## 2024-02-01 DIAGNOSIS — R070 Pain in throat: Secondary | ICD-10-CM | POA: Diagnosis not present

## 2024-02-01 DIAGNOSIS — R519 Headache, unspecified: Secondary | ICD-10-CM | POA: Diagnosis not present

## 2024-02-01 DIAGNOSIS — R051 Acute cough: Secondary | ICD-10-CM | POA: Diagnosis not present

## 2024-02-01 DIAGNOSIS — R0981 Nasal congestion: Secondary | ICD-10-CM | POA: Diagnosis not present

## 2024-02-03 DIAGNOSIS — G4733 Obstructive sleep apnea (adult) (pediatric): Secondary | ICD-10-CM | POA: Diagnosis not present

## 2024-02-12 DIAGNOSIS — L570 Actinic keratosis: Secondary | ICD-10-CM | POA: Diagnosis not present

## 2024-02-12 DIAGNOSIS — L821 Other seborrheic keratosis: Secondary | ICD-10-CM | POA: Diagnosis not present

## 2024-02-12 DIAGNOSIS — L578 Other skin changes due to chronic exposure to nonionizing radiation: Secondary | ICD-10-CM | POA: Diagnosis not present

## 2024-02-14 ENCOUNTER — Telehealth: Payer: Self-pay | Admitting: Pharmacist

## 2024-02-14 NOTE — Telephone Encounter (Signed)
Submitted a Prior Authorization request to [employer] for OFEV via CoverMyMeds. Will update once we receive a response.  Key: Doreatha Lew

## 2024-02-15 NOTE — Telephone Encounter (Signed)
Received notification from [employer] regarding a prior authorization for OFEV. Authorization has been APPROVED from 01/15/2024 to 02/14/2025.   Patient must continue to fill through [facility] (pulmonary fibrosis team). [phone]  Authorization # [id number]  Chesley Mires, PharmD, MPH, BCPS, CPP Clinical Pharmacist (Rheumatology and Pulmonology)

## 2024-02-16 DIAGNOSIS — N390 Urinary tract infection, site not specified: Secondary | ICD-10-CM | POA: Diagnosis not present

## 2024-02-24 DIAGNOSIS — N401 Enlarged prostate with lower urinary tract symptoms: Secondary | ICD-10-CM | POA: Diagnosis not present

## 2024-02-24 DIAGNOSIS — R339 Retention of urine, unspecified: Secondary | ICD-10-CM | POA: Diagnosis not present

## 2024-02-25 DIAGNOSIS — N401 Enlarged prostate with lower urinary tract symptoms: Secondary | ICD-10-CM | POA: Diagnosis not present

## 2024-02-25 DIAGNOSIS — R339 Retention of urine, unspecified: Secondary | ICD-10-CM | POA: Diagnosis not present

## 2024-02-26 DIAGNOSIS — D519 Vitamin B12 deficiency anemia, unspecified: Secondary | ICD-10-CM | POA: Diagnosis not present

## 2024-03-01 ENCOUNTER — Encounter: Payer: Self-pay | Admitting: Pulmonary Disease

## 2024-03-01 ENCOUNTER — Ambulatory Visit: Payer: Medicare PPO | Admitting: Pulmonary Disease

## 2024-03-01 VITALS — BP 138/72 | HR 73 | Ht 69.0 in | Wt 180.8 lb

## 2024-03-01 DIAGNOSIS — G4733 Obstructive sleep apnea (adult) (pediatric): Secondary | ICD-10-CM

## 2024-03-01 DIAGNOSIS — Z5181 Encounter for therapeutic drug level monitoring: Secondary | ICD-10-CM | POA: Diagnosis not present

## 2024-03-01 DIAGNOSIS — J84112 Idiopathic pulmonary fibrosis: Secondary | ICD-10-CM

## 2024-03-01 DIAGNOSIS — R0609 Other forms of dyspnea: Secondary | ICD-10-CM

## 2024-03-01 DIAGNOSIS — I272 Pulmonary hypertension, unspecified: Secondary | ICD-10-CM

## 2024-03-01 NOTE — Progress Notes (Addendum)
 Vincent Wiley    [id number]    [birth year]  Primary Care Physician:Street, Stephanie Coup, MD  Referring Physician: Casper Harrison, Stephanie Coup, MD [address] [place],  [place] [address]  Chief complaint: Follow-up for IPF.  HPI: 83 y.o.  with history of coronary artery disease, paroxysmal atrial fibrillation, hypertension, dyslipidemia. Follows with Dr. Tomie China, cardiology.  He has been referred for evaluation of interstitial lung disease, pulmonary fibrosis seen on CT scan  Complains of chronic dyspnea on exertion which is worsening over the past few years Denies any cough, sputum production, fevers, chills  Referred for pulmonary rehab at [facility] in late 2022 Started on Esbriet in October 2022.  This has been poorly tolerated with weight loss, loss of appetite, fatigue. Started Durel Salts in February 2023.  Dose reduced to 100 mg twice daily in July 2023 due to diarrhea.  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  No feather pillows or comforters Smoking history: 26-pack-year smoker.  Quit in 1984 Travel history: No significant travel history Relevant family history: No family history of lung disease  Interim history: Discussed the use of AI scribe software for clinical note transcription with the patient, who gave verbal consent to proceed.  The patient, with idiopathic pulmonary fibrosis, presents with worsening shortness of breath.  He experiences worsening shortness of breath, particularly when in crowds, talking a lot, getting excited, or walking too fast. His breathing is gradually worsening over time.  He is currently being treated for idiopathic pulmonary fibrosis and is on Ofev at a reduced dose of 100 mg twice daily. He is doing well on this medication without experiencing diarrhea, which was a previous side effect. His lung function tests from December were  stable.  His oxygen levels drop when he becomes too short of breath, and his heart rate increases during these episodes. The last ultrasound of his heart was conducted in 2022, and he has a cardiologist whom he visits approximately every nine months.  He is also using a Stiolto inhaler but does not notice a significant difference in his symptoms with its use.   Outpatient Encounter Medications as of 03/01/2024  Medication Sig   alfuzosin (UROXATRAL) 10 MG 24 hr tablet Take 10 mg by mouth daily with breakfast.   amiodarone (PACERONE) 200 MG tablet Take 0.5 tablets (100 mg total) by mouth daily.   apixaban (ELIQUIS) 5 MG TABS tablet TAKE 1 TABLET TWICE A DAY   bethanechol (URECHOLINE) 50 MG tablet Take 50 mg by mouth 2 (two) times daily.   Cholecalciferol (D3 2000) 50 MCG (2000 UT) CAPS Take 2,000 Units by mouth daily at 12 noon.   Coenzyme Q10 (COQ10) 100 MG CAPS Take 100 mg by mouth daily.   finasteride (PROSCAR) 5 MG tablet Take 5 mg by mouth daily.   fluticasone (FLONASE) 50 MCG/ACT nasal spray Place 1 spray into both nostrils 2 (two) times daily.   methenamine (HIPREX) 1 g tablet Take 1 g by mouth every morning.   Multiple Vitamins-Minerals (VITAMIN D3 COMPLETE PO) Take 1 tablet by mouth daily.   Nintedanib (OFEV) 100 MG CAPS Take 1 capsule (100 mg total) by mouth 2 (two) times daily.   pantoprazole (PROTONIX) 40 MG tablet Take 40 mg by mouth 2 (two) times daily.   Probiotic Product (PROBIOTIC DAILY PO) Take 1 tablet by mouth daily.   simvastatin (ZOCOR) 20  MG tablet Take 20 mg by mouth every evening.   sucralfate (CARAFATE) 1 GM/10ML suspension Take 1 g by mouth 2 (two) times daily.   telmisartan (MICARDIS) 20 MG tablet Take 10 mg by mouth daily.   Tiotropium Bromide-Olodaterol (STIOLTO RESPIMAT) 2.5-2.5 MCG/ACT AERS Inhale 2 puffs into the lungs daily.   valACYclovir (VALTREX) 500 MG tablet Take 500 mg by mouth 2 (two) times daily as needed for other (Fever blisters). Fever blisters    No facility-administered encounter medications on file as of 03/01/2024.    Physical Exam: Blood pressure 138/72, pulse 73, height 5\' 9"  (1.753 m), weight 180 lb 12.8 oz (82 kg), SpO2 96%. Gen:      No acute distress HEENT:  EOMI, sclera anicteric Neck:     No masses; no thyromegaly Lungs:    Bibasal crackles CV:         Regular rate and rhythm; no murmurs Abd:      + bowel sounds; soft, non-tender; no palpable masses, no distension Ext:    No edema; adequate peripheral perfusion Skin:      Warm and dry; no rash Neuro: alert and oriented x 3 Psych: normal mood and affect   Data Reviewed: Imaging: High-res CT 03/28/2021-pulmonary fibrosis in UIP pattern.  Honeycombing present, bronchial wall thickening, emphysema, coronary artery disease, aortic aneurysm, enlargement of pulmonary artery  CT chest 05/03/2021-redemonstrated pulmonary fibrosis  High-resolution CT 05/09/2022-UIP pulmonary fibrosis with progression.  High resolution CT 08/02/2023-stable pattern of UIP pulmonary fibrosis.  I have reviewed the images personally.  Cardiac: Echocardiogram 04/03/2019- LVEF 60 to 65%, mild concentric LVH, grade 3 diastolic dysfunction, mildly elevated PA systolic pressure, ASD  PFTs: 05/01/8468 FVC 2.70 [102%], FEV1 3.22 [126%], F/F 87, TLC 5.21 [79%], DLCO 13.51 [59%] Severe diffusion defect  07/22/2022 FVC 3.28 [94%], FEV1 2.96 [117%], F/F 88, TLC 5.29 [80%], DLCO 10.29 [45%] Severe diffusion defect  12/15/2023 FVC 3.18 [89%], FEV1 2.70 [109%], F/F85, DLCO 11.20 [50%] Moderate diffusion defect  Labs: CTD serologies 09/03/2021- negative Hepatic panel 01/17/2024-within normal limits  Cardiac Echocardiogram 04/02/2021-LVEF 60-65%, normal RV systolic size and function.  Mildly elevated PA systolic pressure.  Sleep: Overnight oximetry on CPAP and oxygen dated 04/21/2022 Duration of study 8 hours 46 minutes Nadir O2 sat of 90%.  Oxygen saturations remained around 96% for majority of the  study  Assessment:  Pulmonary fibrosis, IPF I have reviewed prior CT scans and typical UIP pattern with honeycombing.  This is likely to be idiopathic pulmonary fibrosis in the absence of any exposures or signs and symptoms of connective tissue disease.  CTD serologies are negative  Discussed findings and diagnosis with patient No need for biopsy as he diagnosis of IPF is fairly certain Esbriet is poorly tolerated and he is now on Ofev  Reports worsening dyspnea in crowds, during conversation, or physical exertion. IPF is slowly progressive. Currently on Ofev (nintedanib) 100 mg twice daily to slow disease progression. Lung function tests from December indicate stability. Using Stiolto inhaler without significant perceived benefit. Crackles present on lung auscultation, expected due to lung scarring.  - Continue Ofev 100 mg twice daily.  Liver test in January 2025 are normal - Continue Stiolto inhaler - Order CT scan in six months to reassess lung condition  Pulmonary hypertension He has mild elevation in PA systolic pressure on echocardiogram in 2022 Experiences decreased oxygen saturation and tachycardia when dyspneic, suggesting potential pulmonary hypertension due to group 3 disease.  - Contact Dr. Tomie China to arrange an echocardiogram to  assess for cardiac strain     Obstructive sleep apnea Compliant with CPAP and 2 L oxygen at night.  Follows with [facility]  Plan/Recommendations: Continue Ofev at reduced dose Monitor labs Arrange for echocardiogram to evaluate pulmonary hypertension  Chilton Greathouse MD [facility] 03/01/2024, 11:01 AM  CC: Street, [place], *

## 2024-03-01 NOTE — Addendum Note (Signed)
 Addended by: Eleonore Chiquito on: 03/01/2024 12:59 PM   Modules accepted: Orders

## 2024-03-01 NOTE — Patient Instructions (Signed)
 VISIT SUMMARY:  During today's visit, we discussed your worsening shortness of breath, particularly in certain situations such as being in crowds, talking a lot, getting excited, or walking too fast. We reviewed your current treatment for idiopathic pulmonary fibrosis and your cardiac health.  YOUR PLAN:  -IDIOPATHIC PULMONARY FIBROSIS (IPF): Idiopathic Pulmonary Fibrosis is a condition where the lungs become scarred and breathing becomes increasingly difficult. You are currently taking Ofev 100 mg twice daily to help slow the progression of the disease. Your lung function tests from December show stability. You are also using a Stiolto inhaler, although you haven't noticed much improvement with it. We will continue with your current medications and schedule a CT scan in six months to reassess your lung condition.  -CARDIAC EVALUATION: Your episodes of decreased oxygen levels and increased heart rate when short of breath may indicate cardiac strain due to your lung condition. We will contact your cardiologist, Dr. Normajean Baxter, to arrange an echocardiogram to check for any cardiac strain.  INSTRUCTIONS:  Please continue taking Ofev 100 mg twice daily and using your Stiolto inhaler as prescribed. We will schedule a CT scan in six months to reassess your lung condition. Additionally, we will arrange an echocardiogram with your cardiologist, Dr. Normajean Baxter, to evaluate for any cardiac strain. If you experience any new or worsening symptoms, please contact our office.

## 2024-03-02 DIAGNOSIS — G4733 Obstructive sleep apnea (adult) (pediatric): Secondary | ICD-10-CM | POA: Diagnosis not present

## 2024-03-20 DIAGNOSIS — G4733 Obstructive sleep apnea (adult) (pediatric): Secondary | ICD-10-CM | POA: Diagnosis not present

## 2024-03-26 ENCOUNTER — Ambulatory Visit: Attending: Cardiology

## 2024-03-26 DIAGNOSIS — R0609 Other forms of dyspnea: Secondary | ICD-10-CM | POA: Diagnosis not present

## 2024-03-26 DIAGNOSIS — R339 Retention of urine, unspecified: Secondary | ICD-10-CM | POA: Diagnosis not present

## 2024-03-26 DIAGNOSIS — N401 Enlarged prostate with lower urinary tract symptoms: Secondary | ICD-10-CM | POA: Diagnosis not present

## 2024-03-26 LAB — ECHOCARDIOGRAM COMPLETE
Area-P 1/2: 4.8 cm2
MV M vel: 6.46 m/s
MV Peak grad: 166.9 mmHg
S' Lateral: 3.3 cm

## 2024-03-28 DIAGNOSIS — D519 Vitamin B12 deficiency anemia, unspecified: Secondary | ICD-10-CM | POA: Diagnosis not present

## 2024-03-29 ENCOUNTER — Telehealth: Payer: Self-pay

## 2024-03-29 DIAGNOSIS — I7121 Aneurysm of the ascending aorta, without rupture: Secondary | ICD-10-CM

## 2024-03-29 NOTE — Telephone Encounter (Signed)
-----   Message from Aundra Dubin Revankar sent at 03/26/2024  5:32 PM EDT ----- Echo is fine.  Needs CT of the chest for aneurysm.  Copy primary Garwin Brothers, MD 03/26/2024 5:32 PM

## 2024-03-29 NOTE — Telephone Encounter (Signed)
Results reviewed with pt as per Dr. Revankar's note.  Pt verbalized understanding and had no additional questions. Routed to PCP.  

## 2024-04-02 ENCOUNTER — Ambulatory Visit (INDEPENDENT_AMBULATORY_CARE_PROVIDER_SITE_OTHER)
Admission: RE | Admit: 2024-04-02 | Discharge: 2024-04-02 | Disposition: A | Discharge status: Home / Self Care | Source: Ambulatory Visit | Attending: Cardiology | Admitting: Cardiology

## 2024-04-02 DIAGNOSIS — I7121 Aneurysm of the ascending aorta, without rupture: Secondary | ICD-10-CM

## 2024-04-02 DIAGNOSIS — J849 Interstitial pulmonary disease, unspecified: Secondary | ICD-10-CM | POA: Diagnosis not present

## 2024-04-02 DIAGNOSIS — G4733 Obstructive sleep apnea (adult) (pediatric): Secondary | ICD-10-CM | POA: Diagnosis not present

## 2024-04-02 DIAGNOSIS — J432 Centrilobular emphysema: Secondary | ICD-10-CM | POA: Diagnosis not present

## 2024-04-02 DIAGNOSIS — R0602 Shortness of breath: Secondary | ICD-10-CM

## 2024-04-04 DIAGNOSIS — G4733 Obstructive sleep apnea (adult) (pediatric): Secondary | ICD-10-CM | POA: Diagnosis not present

## 2024-04-08 ENCOUNTER — Telehealth: Payer: Self-pay | Admitting: Cardiology

## 2024-04-08 NOTE — Telephone Encounter (Signed)
 Patient called to follow up on CT CHEST WO CONTRAST results.

## 2024-04-08 NOTE — Telephone Encounter (Signed)
 Advised that results are not completed.

## 2024-04-10 DIAGNOSIS — R339 Retention of urine, unspecified: Secondary | ICD-10-CM | POA: Diagnosis not present

## 2024-04-10 DIAGNOSIS — R051 Acute cough: Secondary | ICD-10-CM | POA: Diagnosis not present

## 2024-04-10 DIAGNOSIS — R972 Elevated prostate specific antigen [PSA]: Secondary | ICD-10-CM | POA: Diagnosis not present

## 2024-04-10 DIAGNOSIS — N401 Enlarged prostate with lower urinary tract symptoms: Secondary | ICD-10-CM | POA: Diagnosis not present

## 2024-04-10 DIAGNOSIS — R070 Pain in throat: Secondary | ICD-10-CM | POA: Diagnosis not present

## 2024-04-10 DIAGNOSIS — R519 Headache, unspecified: Secondary | ICD-10-CM | POA: Diagnosis not present

## 2024-04-10 DIAGNOSIS — R509 Fever, unspecified: Secondary | ICD-10-CM | POA: Diagnosis not present

## 2024-04-10 DIAGNOSIS — R0981 Nasal congestion: Secondary | ICD-10-CM | POA: Diagnosis not present

## 2024-04-16 DIAGNOSIS — R339 Retention of urine, unspecified: Secondary | ICD-10-CM | POA: Diagnosis not present

## 2024-04-19 NOTE — Telephone Encounter (Signed)
 Patient is calling to see if results are in. Please advise

## 2024-04-22 NOTE — Telephone Encounter (Signed)
 Called the patient and informed him that the results of the test had not been interpreted at this time. Patient stated that he would check back with the office in a couple of days.

## 2024-04-24 NOTE — Telephone Encounter (Signed)
 See result note.

## 2024-04-24 NOTE — Telephone Encounter (Signed)
 Vincent Wiley states that she will escalate the reading for the CT scan.

## 2024-04-25 DIAGNOSIS — R339 Retention of urine, unspecified: Secondary | ICD-10-CM | POA: Diagnosis not present

## 2024-04-25 DIAGNOSIS — N401 Enlarged prostate with lower urinary tract symptoms: Secondary | ICD-10-CM | POA: Diagnosis not present

## 2024-04-26 DIAGNOSIS — G4733 Obstructive sleep apnea (adult) (pediatric): Secondary | ICD-10-CM | POA: Diagnosis not present

## 2024-04-29 DIAGNOSIS — E538 Deficiency of other specified B group vitamins: Secondary | ICD-10-CM | POA: Diagnosis not present

## 2024-05-02 DIAGNOSIS — G4733 Obstructive sleep apnea (adult) (pediatric): Secondary | ICD-10-CM | POA: Diagnosis not present

## 2024-05-09 DIAGNOSIS — D6869 Other thrombophilia: Secondary | ICD-10-CM | POA: Diagnosis not present

## 2024-05-09 DIAGNOSIS — Z6826 Body mass index (BMI) 26.0-26.9, adult: Secondary | ICD-10-CM | POA: Diagnosis not present

## 2024-05-09 DIAGNOSIS — G4734 Idiopathic sleep related nonobstructive alveolar hypoventilation: Secondary | ICD-10-CM | POA: Diagnosis not present

## 2024-05-09 DIAGNOSIS — J8410 Pulmonary fibrosis, unspecified: Secondary | ICD-10-CM | POA: Diagnosis not present

## 2024-05-09 DIAGNOSIS — J9611 Chronic respiratory failure with hypoxia: Secondary | ICD-10-CM | POA: Diagnosis not present

## 2024-05-09 DIAGNOSIS — G4733 Obstructive sleep apnea (adult) (pediatric): Secondary | ICD-10-CM | POA: Diagnosis not present

## 2024-05-09 DIAGNOSIS — Z9981 Dependence on supplemental oxygen: Secondary | ICD-10-CM | POA: Diagnosis not present

## 2024-05-09 DIAGNOSIS — E663 Overweight: Secondary | ICD-10-CM | POA: Diagnosis not present

## 2024-05-09 DIAGNOSIS — I480 Paroxysmal atrial fibrillation: Secondary | ICD-10-CM | POA: Diagnosis not present

## 2024-05-26 DIAGNOSIS — R339 Retention of urine, unspecified: Secondary | ICD-10-CM | POA: Diagnosis not present

## 2024-05-26 DIAGNOSIS — N401 Enlarged prostate with lower urinary tract symptoms: Secondary | ICD-10-CM | POA: Diagnosis not present

## 2024-05-27 DIAGNOSIS — Z131 Encounter for screening for diabetes mellitus: Secondary | ICD-10-CM | POA: Diagnosis not present

## 2024-05-27 DIAGNOSIS — E782 Mixed hyperlipidemia: Secondary | ICD-10-CM | POA: Diagnosis not present

## 2024-05-27 DIAGNOSIS — Z79899 Other long term (current) drug therapy: Secondary | ICD-10-CM | POA: Diagnosis not present

## 2024-05-27 DIAGNOSIS — I10 Essential (primary) hypertension: Secondary | ICD-10-CM | POA: Diagnosis not present

## 2024-05-27 DIAGNOSIS — E538 Deficiency of other specified B group vitamins: Secondary | ICD-10-CM | POA: Diagnosis not present

## 2024-05-27 DIAGNOSIS — J9611 Chronic respiratory failure with hypoxia: Secondary | ICD-10-CM | POA: Diagnosis not present

## 2024-05-27 DIAGNOSIS — Z0000 Encounter for general adult medical examination without abnormal findings: Secondary | ICD-10-CM | POA: Diagnosis not present

## 2024-05-27 DIAGNOSIS — G4733 Obstructive sleep apnea (adult) (pediatric): Secondary | ICD-10-CM | POA: Diagnosis not present

## 2024-05-27 DIAGNOSIS — I25119 Atherosclerotic heart disease of native coronary artery with unspecified angina pectoris: Secondary | ICD-10-CM | POA: Diagnosis not present

## 2024-05-27 DIAGNOSIS — J8410 Pulmonary fibrosis, unspecified: Secondary | ICD-10-CM | POA: Diagnosis not present

## 2024-05-27 LAB — LAB REPORT - SCANNED
A1c: 5.5
EGFR: 60

## 2024-05-30 DIAGNOSIS — D519 Vitamin B12 deficiency anemia, unspecified: Secondary | ICD-10-CM | POA: Diagnosis not present

## 2024-06-02 DIAGNOSIS — G4733 Obstructive sleep apnea (adult) (pediatric): Secondary | ICD-10-CM | POA: Diagnosis not present

## 2024-06-13 DIAGNOSIS — H18791 Other corneal deformities, right eye: Secondary | ICD-10-CM | POA: Diagnosis not present

## 2024-06-13 DIAGNOSIS — H02125 Mechanical ectropion of left lower eyelid: Secondary | ICD-10-CM | POA: Diagnosis not present

## 2024-06-13 DIAGNOSIS — H04121 Dry eye syndrome of right lacrimal gland: Secondary | ICD-10-CM | POA: Diagnosis not present

## 2024-06-18 DIAGNOSIS — G4733 Obstructive sleep apnea (adult) (pediatric): Secondary | ICD-10-CM | POA: Diagnosis not present

## 2024-06-25 DIAGNOSIS — N401 Enlarged prostate with lower urinary tract symptoms: Secondary | ICD-10-CM | POA: Diagnosis not present

## 2024-06-25 DIAGNOSIS — R339 Retention of urine, unspecified: Secondary | ICD-10-CM | POA: Diagnosis not present

## 2024-06-26 DIAGNOSIS — G4733 Obstructive sleep apnea (adult) (pediatric): Secondary | ICD-10-CM | POA: Diagnosis not present

## 2024-06-27 DIAGNOSIS — Z20828 Contact with and (suspected) exposure to other viral communicable diseases: Secondary | ICD-10-CM | POA: Diagnosis not present

## 2024-07-01 DIAGNOSIS — D519 Vitamin B12 deficiency anemia, unspecified: Secondary | ICD-10-CM | POA: Diagnosis not present

## 2024-07-02 DIAGNOSIS — G4733 Obstructive sleep apnea (adult) (pediatric): Secondary | ICD-10-CM | POA: Diagnosis not present

## 2024-07-10 ENCOUNTER — Ambulatory Visit: Payer: Self-pay

## 2024-07-10 NOTE — Telephone Encounter (Signed)
 FYI Only or Action Required?: Action required by provider: clinical question for provider.  Patient is followed in Pulmonology for pulmonary fibrosis, last seen on 03/01/2024 by Mannam, Praveen, MD.  Called Nurse Triage reporting Medication Question.  Symptoms began about a month ago.  Interventions attempted: Other: anti diarrheal medication.  Symptoms are: unchanged.  Triage Disposition: Call PCP When Office is Open  Patient/caregiver understands and will follow disposition?: Yes   Copied from CRM [id number]. Topic: Clinical - Red Word Triage >> Jul 10, 2024  5:01 PM Rilla B wrote: [facility] that prompted transfer to Nurse Triage: Headache, weakness, some diff breathing following new med Ofev .   Reason for Disposition  [1] Caller has NON-URGENT medicine question about med that PCP prescribed AND [2] triager unable to answer question  Answer Assessment - Initial Assessment Questions 1. NAME of MEDICINE: What medicine(s) are you calling about?     Ofev  2. QUESTION: What is your question? (e.g., double dose of medicine, side effect)     Does Dr. Darlean want him to stop taking due to his symptoms  3. PRESCRIBER: Who prescribed the medicine? Reason: if prescribed by specialist, call should be referred to that group.     Dr. Darlean 4. SYMPTOMS: Do you have any symptoms? If Yes, ask: What symptoms are you having?  How bad are the symptoms (e.g., mild, moderate, severe)     Diarrhea, headache, mild weakness  Answer Assessment - Initial Assessment Questions 1. DIARRHEA SEVERITY: How bad is the diarrhea? How many more stools have you had in the past 24 hours than normal?      None in the last 2 days 2. ONSET: When did the diarrhea begin?      1 month  3. STOOL DESCRIPTION:  How loose or watery is the diarrhea? What is the stool color? Is there any blood or mucous in the stool?     Loose to watery  4. VOMITING: Are you also vomiting? If Yes, ask: How many times in  the past 24 hours?      No 5. ABDOMEN PAIN: Are you having any abdomen pain? If Yes, ask: What does it feel like? (e.g., crampy, dull, intermittent, constant)      No 6. ABDOMEN PAIN SEVERITY: If present, ask: How bad is the pain?  (e.g., Scale 1-10; mild, moderate, or severe)     0/10 7. ORAL INTAKE: If vomiting, Have you been able to drink liquids? How much liquids have you had in the past 24 hours?     No 8. HYDRATION: Any signs of dehydration? (e.g., dry mouth [not just dry lips], too weak to stand, dizziness, new weight loss) When did you last urinate?     No 9. EXPOSURE: Have you traveled to a foreign country recently? Have you been exposed to anyone with diarrhea? Could you have eaten any food that was spoiled?     No 10. ANTIBIOTIC USE: Are you taking antibiotics now or have you taken antibiotics in the past 2 months?       No 11. OTHER SYMPTOMS: Do you have any other symptoms? (e.g., fever, blood in stool)       Headache, mild weakness, becomes short of breath easy  Protocols used: Diarrhea-A-AH, Medication Question Call-A-AH

## 2024-07-11 NOTE — Telephone Encounter (Signed)
 Dr Theophilus, Please advise.

## 2024-07-12 ENCOUNTER — Telehealth: Payer: Self-pay

## 2024-07-12 NOTE — Telephone Encounter (Signed)
 Copied from CRM [id number]. Topic: General - Other >> Jul 11, 2024  4:58 PM Rilla B wrote: Reason for CRM: Patient call in and would like to speak with Dr Jiles nurse. I told patient the clinical staff was not available and he wanted to hold. Would not give info, but I did tell patient he would get a call back. Please call patient.  Called and spoke with the patient- got disconnected.  Tried calling the patient again, no answer- lvm to call back.

## 2024-07-12 NOTE — Telephone Encounter (Signed)
 I called and discussed with patient. For the past few weeks he has had worsening diarrhea, loss of appetite and headaches. I instructed him to stop the Ofev  for 2 to 3 weeks and resume slowly with 1 tablet a day. If symptoms recur then we will discuss alternatives at his scheduled follow-up visit in September.  Nothing further needed.

## 2024-07-18 DIAGNOSIS — G4733 Obstructive sleep apnea (adult) (pediatric): Secondary | ICD-10-CM | POA: Diagnosis not present

## 2024-07-22 DIAGNOSIS — K219 Gastro-esophageal reflux disease without esophagitis: Secondary | ICD-10-CM | POA: Diagnosis not present

## 2024-07-22 DIAGNOSIS — R195 Other fecal abnormalities: Secondary | ICD-10-CM | POA: Diagnosis not present

## 2024-07-23 ENCOUNTER — Ambulatory Visit: Attending: Cardiology | Admitting: Cardiology

## 2024-07-23 ENCOUNTER — Encounter: Payer: Self-pay | Admitting: Cardiology

## 2024-07-23 VITALS — BP 126/70 | HR 94 | Ht 69.6 in | Wt 181.2 lb

## 2024-07-23 DIAGNOSIS — I2510 Atherosclerotic heart disease of native coronary artery without angina pectoris: Secondary | ICD-10-CM | POA: Diagnosis not present

## 2024-07-23 DIAGNOSIS — I10 Essential (primary) hypertension: Secondary | ICD-10-CM | POA: Diagnosis not present

## 2024-07-23 DIAGNOSIS — R0602 Shortness of breath: Secondary | ICD-10-CM

## 2024-07-23 DIAGNOSIS — I480 Paroxysmal atrial fibrillation: Secondary | ICD-10-CM

## 2024-07-23 DIAGNOSIS — E782 Mixed hyperlipidemia: Secondary | ICD-10-CM

## 2024-07-23 DIAGNOSIS — I7121 Aneurysm of the ascending aorta, without rupture: Secondary | ICD-10-CM

## 2024-07-23 NOTE — Progress Notes (Signed)
 Cardiology Office Note:    Date:  07/23/2024   ID:  Vincent Wiley, DOB 06/13/1941, MRN [id number]  PCP:  Street, Lonni HERO, MD  Cardiologist:  Jennifer JONELLE Crape, MD   Referring MD: [address], Lonni HERO, *    ASSESSMENT:    1. Atherosclerosis of native coronary artery of native heart without angina pectoris   2. Ascending aortic aneurysm, unspecified whether ruptured (HCC)   3. Benign essential hypertension   4. Essential (primary) hypertension   5. Paroxysmal atrial fibrillation (HCC)   6. Mixed hyperlipidemia    PLAN:    In order of problems listed above:  Primary prevention stressed with the patient.  Importance of compliance with diet medication stressed and patient verbalized standing. Paroxysmal atrial fibrillation: Patient is now back in atrial fibrillation and wants to feel better.  He has dyspnea on exertion probably related to atrial fibrillation.  I increase amiodarone  to 400 mg daily.  I have discussed with him about cardioversion benefits and potential risks and he is agreeable and we will set him up with our clinic at [facility] hospital.  He mentions to me that over the past 3 to 4 weeks he has never missed a dose of Eliquis .  I confirmed this by asking him question twice. Essential hypertension: Blood pressure stable and diet was emphasized Mixed dyslipidemia: On lipid-lowering medications followed by primary care. Amiodarone  therapy: Benefits and potential risks explained and he vocalized understanding He will  be seen in follow-up after the cardioversion.   Medication Adjustments/Labs and Tests Ordered: Current medicines are reviewed at length with the patient today.  Concerns regarding medicines are outlined above.  Orders Placed This Encounter  Procedures   EKG 12-Lead   No orders of the defined types were placed in this encounter.    No chief complaint on file.    History of Present Illness:    Vincent Wiley is a 83 y.o. male.  Patient has past  medical history of paroxysmal atrial fibrillation, ascending aortic aneurysm, essential hypertension and mixed dyslipidemia he mentions to me that in the past several days he has been noticing shortness of breath on exertion.  No chest pain orthopnea or PND.  This has affected his quality of life and he wants to feel better.  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    Chronic GI losses, B12 Deficiency   Ascending aortic aneurysm (HCC) 06/08/2021   Benign essential hypertension    Benign prostatic hyperplasia 01/12/2022   Benign prostatic hyperplasia with lower urinary tract symptoms 01/19/2023   BPH with obstruction/lower urinary tract symptoms    Cardiac murmur 03/31/2021   Chronic respiratory failure with hypoxia (HCC) 09/21/2023   Coronary atherosclerosis 06/08/2021   Diastolic heart failure (HCC) 09/21/2023   DOE (dyspnea on exertion)    Emphysema lung (HCC) 09/21/2023   Encounter for fitting and adjustment of hearing aid 04/19/2022   Encounter for immunization 04/19/2022   Essential (primary) hypertension 01/12/2022   Gastroesophageal reflux disease 01/12/2022   Hyperlipidemia    Idiopathic pulmonary fibrosis (HCC) 01/12/2022   Impacted cerumen, bilateral 04/19/2022   Kidney disease    Obstructive sleep apnea syndrome 04/30/2013   NPSG 01/2012:  AHI 27/hr with desat to 86%.   Other retention of urine 01/19/2023   Paroxysmal atrial fibrillation (HCC) 03/31/2021   Pernicious anemia    Pulmonary fibrosis (HCC) 03/31/2021   Rash 09/21/2023   Seasonal  allergies    Secundum atrial septal defect 01/25/2023   Sensorineural hearing loss, bilateral 04/19/2022   Sleep apnea    Vitamin B12 deficiency     Past Surgical History:  Procedure Laterality Date   EYE SURGERY Bilateral    right eye realignment//cataract left eye   HERNIA REPAIR     umbilical   MULTIPLE TOOTH EXTRACTIONS     REPLACEMENT  TOTAL KNEE Bilateral     Current Medications: Current Meds  Medication Sig   alfuzosin (UROXATRAL) 10 MG 24 hr tablet Take 10 mg by mouth daily with breakfast.   amiodarone  (PACERONE ) 200 MG tablet Take 0.5 tablets (100 mg total) by mouth daily.   apixaban  (ELIQUIS ) 5 MG TABS tablet TAKE 1 TABLET TWICE A DAY   bethanechol (URECHOLINE) 50 MG tablet Take 50 mg by mouth 2 (two) times daily.   Cholecalciferol (D3 2000) 50 MCG (2000 UT) CAPS Take 2,000 Units by mouth daily at 12 noon.   Coenzyme Q10 (COQ10) 100 MG CAPS Take 100 mg by mouth daily.   finasteride (PROSCAR) 5 MG tablet Take 5 mg by mouth daily.   fluticasone (FLONASE) 50 MCG/ACT nasal spray Place 1 spray into both nostrils 2 (two) times daily.   methenamine (HIPREX) 1 g tablet Take 1 g by mouth every morning.   Multiple Vitamins-Minerals (VITAMIN D3 COMPLETE PO) Take 1 tablet by mouth daily.   pantoprazole (PROTONIX) 40 MG tablet Take 40 mg by mouth 2 (two) times daily.   Probiotic Product (PROBIOTIC DAILY PO) Take 1 tablet by mouth daily.   simvastatin (ZOCOR) 20 MG tablet Take 20 mg by mouth every evening.   sucralfate (CARAFATE) 1 GM/10ML suspension Take 1 g by mouth 2 (two) times daily.   telmisartan (MICARDIS) 20 MG tablet Take 10 mg by mouth daily.   Tiotropium Bromide-Olodaterol (STIOLTO RESPIMAT ) 2.5-2.5 MCG/ACT AERS Inhale 2 puffs into the lungs daily.   valACYclovir (VALTREX) 500 MG tablet Take 500 mg by mouth 2 (two) times daily as needed for other (Fever blisters). Fever blisters     Allergies:   Sulfamethoxazole, Amlodipine, Ceftriaxone, Chlorhexidine gluconate, Ciprofloxacin, Cortisone acetate [cortisone], Elemental sulfur, Flomax [tamsulosin], Hibiclens [chlorhexidine gluconate], Lisinopril, Other, Penicillins, Probiata [lactobacillus], Rocephin [ceftriaxone sodium in dextrose], and Sulfa antibiotics   Social History   Socioeconomic History   Marital status: Unknown    Spouse name: Not on file   Number of  children: Not on file   Years of education: Not on file   Highest education level: Not on file  Occupational History   Occupation: retired  Tobacco Use   Smoking status: Former    Current packs/day: 0.00    Average packs/day: 1.5 packs/day for 25.0 years (37.5 ttl pk-yrs)    Types: Cigarettes    Start date: 08/26/1958    Quit date: 08/27/1983    Years since quitting: 40.9   Smokeless tobacco: Never  Substance and Sexual Activity   Alcohol use: No   Drug use: No   Sexual activity: Not on file  Other Topics Concern   Not on file  Social History Narrative   Not on file   Social Drivers of Health   Financial Resource Strain: Not on file  Food Insecurity: Not on file  Transportation Needs: Not on file  Physical Activity: Not on file  Stress: Not on file  Social Connections: Not on file     Family History: The patient's family history includes AAA (abdominal aortic aneurysm) in his father; Aneurysm in his  mother; Breast cancer in his sister and sister; Heart attack in his father.  ROS:   Please see the history of present illness.    All other systems reviewed and are negative.  EKGs/Labs/Other Studies Reviewed:    The following studies were reviewed today: .SABRAEKG Interpretation Date/Time:  Tuesday July 23 2024 11:07:52 EDT Ventricular Rate:  94 PR Interval:    QRS Duration:  98 QT Interval:  380 QTC Calculation: 475 R Axis:   -7  Text Interpretation: Atrial fibrillation Incomplete right bundle branch block Left ventricular hypertrophy Nonspecific ST abnormality Abnormal ECG When compared with ECG of 24-Oct-2023 11:34, Atrial fibrillation has replaced Sinus rhythm Incomplete right bundle branch block is now Present Confirmed by Edwyna Backers [phone]) on 07/23/2024 11:25:44 AM     Recent Labs: 08/09/2023: BUN 22; Creatinine, Ser 1.24; NT-Pro BNP 359; Potassium 4.1; Sodium 139 01/17/2024: ALT 12  Recent Lipid Panel    Component Value Date/Time   CHOL 124 01/25/2023 1142    TRIG 70 01/25/2023 1142   HDL 51 01/25/2023 1142   CHOLHDL 2.4 01/25/2023 1142   LDLCALC 59 01/25/2023 1142    Physical Exam:    VS:  BP 126/70   Pulse 94   Ht 5' 9.6 (1.768 m)   Wt 181 lb 3.2 oz (82.2 kg)   SpO2 93%   BMI 26.30 kg/m     Wt Readings from Last 3 Encounters:  07/23/24 181 lb 3.2 oz (82.2 kg)  03/01/24 180 lb 12.8 oz (82 kg)  12/15/23 178 lb (80.7 kg)     GEN: Patient is in no acute distress HEENT: Normal NECK: No JVD; No carotid bruits LYMPHATICS: No lymphadenopathy CARDIAC: Hear sounds irregular, 2/6 systolic murmur at the apex. RESPIRATORY:  Clear to auscultation without rales, wheezing or rhonchi  ABDOMEN: Soft, non-tender, non-distended MUSCULOSKELETAL:  No edema; No deformity  SKIN: Warm and dry NEUROLOGIC:  Alert and oriented x 3 PSYCHIATRIC:  Normal affect   Signed, Backers JONELLE Edwyna, MD  07/23/2024 11:27 AM    [facility]

## 2024-07-23 NOTE — Patient Instructions (Signed)
 Medication Instructions:  Your physician has recommended you make the following change in your medication:   Increase your Amiodarone  to 400 mg daily til you have your cardioversion 08/25/24.  *If you need a refill on your cardiac medications before your next appointment, please call your pharmacy*   Lab Work: Your physician recommends that you have a CMP, CBC and ProBNP today in the office.  If you have labs (blood work) drawn today and your tests are completely normal, you will receive your results only by: MyChart Message (if you have MyChart) OR A paper copy in the mail If you have any lab test that is abnormal or we need to change your treatment, we will call you to review the results.   Testing/Procedures:            PATIENT INSTRUCTIONS    CARDIOVERSION/TEE CARDIOVERSION     [facility]               [address]               [place], [place] [address]   Patient Name: [place] Dube  Diagnosis:    ICD-10-CM   1. Atherosclerosis of native coronary artery of native heart without angina pectoris  I25.10 EKG 12-Lead    2. Ascending aortic aneurysm, unspecified whether ruptured (HCC)  I71.21     3. Benign essential hypertension  I10     4. Essential (primary) hypertension  I10     5. Paroxysmal atrial fibrillation (HCC)  I48.0     6. Mixed hyperlipidemia  E78.2       DATE AND TIME OF PROCEDURE: 07/25/24 [facility] will call you on 07/24/24 with arrival time.     Please register at the Admitting Department at [facility] will call you on 07/24/24 with arrival time.    .  DO NOT EAT OR DRINK ANYTHING after midnight prior to your procedure.  You should take your medications as usual with a sip of water.  If you are diabetic, DO NOT TAKE YOUR DIABETIC MEDICATIONS the morning OF THE PROCEDURE.  DO NOT STOP any blood thinners (Eliquis ) that you may be taking.  Have your blood drawn as intructed.  You will need someone with you to drive you home after  the procedure.  If you have any questions, please call our office at [phone].   Follow-Up: At [facility], you and your health needs are our priority.  As part of our continuing mission to provide you with exceptional heart care, we have created designated Provider Care Teams.  These Care Teams include your primary Cardiologist (physician) and Advanced Practice Providers (APPs -  Physician Assistants and Nurse Practitioners) who all work together to provide you with the care you need, when you need it.  We recommend signing up for the patient portal called MyChart.  Sign up information is provided on this After Visit Summary.  MyChart is used to connect with patients for Virtual Visits (Telemedicine).  Patients are able to view lab/test results, encounter notes, upcoming appointments, etc.  Non-urgent messages can be sent to your provider as well.   To learn more about what you can do with MyChart, go to [email or web address].    Your next appointment:   1 month(s)  The format for your next appointment:   In Person  Provider:   Jennifer Crape, MD    Other Instructions none  Important Information About Sugar

## 2024-07-24 ENCOUNTER — Ambulatory Visit: Payer: Self-pay | Admitting: Cardiology

## 2024-07-24 LAB — COMPREHENSIVE METABOLIC PANEL WITH GFR
ALT: 8 IU/L (ref 0–44)
AST: 13 IU/L (ref 0–40)
Albumin: 3.7 g/dL (ref 3.7–4.7)
Alkaline Phosphatase: 47 IU/L (ref 44–121)
BUN/Creatinine Ratio: 15 (ref 10–24)
BUN: 16 mg/dL (ref 8–27)
Bilirubin Total: 0.4 mg/dL (ref 0.0–1.2)
CO2: 20 mmol/L (ref 20–29)
Calcium: 8.5 mg/dL — ABNORMAL LOW (ref 8.6–10.2)
Chloride: 107 mmol/L — ABNORMAL HIGH (ref 96–106)
Creatinine, Ser: 1.04 mg/dL (ref 0.76–1.27)
Globulin, Total: 2.5 g/dL (ref 1.5–4.5)
Glucose: 97 mg/dL (ref 70–99)
Potassium: 3.9 mmol/L (ref 3.5–5.2)
Sodium: 144 mmol/L (ref 134–144)
Total Protein: 6.2 g/dL (ref 6.0–8.5)
eGFR: 71 mL/min/1.73 (ref >59)

## 2024-07-24 LAB — CBC WITH DIFFERENTIAL/PLATELET
Basophils Absolute: 0.1 x10E3/uL (ref 0.0–0.2)
Basos: 1 %
EOS (ABSOLUTE): 0.1 x10E3/uL (ref 0.0–0.4)
Eos: 1 %
Hematocrit: 36.8 % — ABNORMAL LOW (ref 37.5–51.0)
Hemoglobin: 12 g/dL — ABNORMAL LOW (ref 13.0–17.7)
Immature Grans (Abs): 0 x10E3/uL (ref 0.0–0.1)
Immature Granulocytes: 0 %
Lymphocytes Absolute: 1.2 x10E3/uL (ref 0.7–3.1)
Lymphs: 10 %
MCH: 32.9 pg (ref 26.6–33.0)
MCHC: 32.6 g/dL (ref 31.5–35.7)
MCV: 101 fL — ABNORMAL HIGH (ref 79–97)
Monocytes Absolute: 1.1 x10E3/uL — ABNORMAL HIGH (ref 0.1–0.9)
Monocytes: 10 %
Neutrophils Absolute: 8.8 x10E3/uL — ABNORMAL HIGH (ref 1.4–7.0)
Neutrophils: 78 %
Platelets: 226 x10E3/uL (ref 150–450)
RBC: 3.65 x10E6/uL — ABNORMAL LOW (ref 4.14–5.80)
RDW: 12.6 % (ref 11.6–15.4)
WBC: 11.3 x10E3/uL — ABNORMAL HIGH (ref 3.4–10.8)

## 2024-07-24 LAB — PRO B NATRIURETIC PEPTIDE: NT-Pro BNP: 2433 pg/mL — ABNORMAL HIGH (ref 0–486)

## 2024-07-25 ENCOUNTER — Telehealth: Payer: Self-pay

## 2024-07-25 DIAGNOSIS — R0602 Shortness of breath: Secondary | ICD-10-CM

## 2024-07-25 DIAGNOSIS — I480 Paroxysmal atrial fibrillation: Secondary | ICD-10-CM

## 2024-07-25 DIAGNOSIS — I4581 Long QT syndrome: Secondary | ICD-10-CM | POA: Diagnosis not present

## 2024-07-25 DIAGNOSIS — I4892 Unspecified atrial flutter: Secondary | ICD-10-CM | POA: Diagnosis not present

## 2024-07-25 MED ORDER — FUROSEMIDE 20 MG PO TABS: 20.0000 mg | ORAL_TABLET | Freq: Every day | ORAL | 0 refills | Status: DC

## 2024-07-25 NOTE — Telephone Encounter (Signed)
 Recommendations reviewed with pt as per Dr. Madireddy's note.  Pt verbalized understanding and had no additional questions.  Dr. Posey patient, cancerlled outpatient cardioversion at [facility] I discussed with them extensively. Sent you a separate note but below is the gist as he needs meds set to pharmacy. thank you. -Hold off on cardioversion and continue with rate control. -Stop amiodarone  given risk for pulmonary fibrosis -Start low-dose furosemide  20 mg once daily, please send this prescription out to [facility] in [place] and subsequent prescriptions to [facility]. -Repeat blood work BMP and magnesium in 7 days - Stop telmisartan given relatively soft blood pressures. -Had not come to the office for nurse visit in 7 days when he is coming in for his blood work to check up his heart rate and rhythm and fluid status. -Keep follow-up appointment with Dr. Edwyna as previously recommended

## 2024-07-26 ENCOUNTER — Other Ambulatory Visit: Payer: Self-pay

## 2024-07-26 DIAGNOSIS — R339 Retention of urine, unspecified: Secondary | ICD-10-CM | POA: Diagnosis not present

## 2024-07-26 DIAGNOSIS — N401 Enlarged prostate with lower urinary tract symptoms: Secondary | ICD-10-CM | POA: Diagnosis not present

## 2024-07-26 DIAGNOSIS — R0602 Shortness of breath: Secondary | ICD-10-CM

## 2024-07-26 DIAGNOSIS — I480 Paroxysmal atrial fibrillation: Secondary | ICD-10-CM

## 2024-07-26 MED ORDER — FUROSEMIDE 20 MG PO TABS: 20.0000 mg | ORAL_TABLET | Freq: Every day | ORAL | 3 refills | Status: DC

## 2024-07-27 DIAGNOSIS — G4733 Obstructive sleep apnea (adult) (pediatric): Secondary | ICD-10-CM | POA: Diagnosis not present

## 2024-07-31 ENCOUNTER — Encounter: Payer: Self-pay | Admitting: Cardiology

## 2024-07-31 ENCOUNTER — Ambulatory Visit: Attending: Cardiology | Admitting: Cardiology

## 2024-07-31 VITALS — BP 128/72 | HR 98 | Ht 70.0 in | Wt 177.4 lb

## 2024-07-31 DIAGNOSIS — I10 Essential (primary) hypertension: Secondary | ICD-10-CM

## 2024-07-31 DIAGNOSIS — J84112 Idiopathic pulmonary fibrosis: Secondary | ICD-10-CM | POA: Diagnosis not present

## 2024-07-31 DIAGNOSIS — I7121 Aneurysm of the ascending aorta, without rupture: Secondary | ICD-10-CM

## 2024-07-31 DIAGNOSIS — I480 Paroxysmal atrial fibrillation: Secondary | ICD-10-CM | POA: Diagnosis not present

## 2024-07-31 DIAGNOSIS — I2510 Atherosclerotic heart disease of native coronary artery without angina pectoris: Secondary | ICD-10-CM

## 2024-07-31 DIAGNOSIS — R0609 Other forms of dyspnea: Secondary | ICD-10-CM

## 2024-07-31 DIAGNOSIS — G4733 Obstructive sleep apnea (adult) (pediatric): Secondary | ICD-10-CM | POA: Diagnosis not present

## 2024-07-31 MED ORDER — METOPROLOL TARTRATE 100 MG PO TABS: 100.0000 mg | ORAL_TABLET | Freq: Once | ORAL | 0 refills | Status: DC

## 2024-07-31 NOTE — Patient Instructions (Signed)
 Medication Instructions:  Your physician recommends that you continue on your current medications as directed. Please refer to the Current Medication list given to you today.  *If you need a refill on your cardiac medications before your next appointment, please call your pharmacy*   Lab Work: Your physician recommends that you have a BMP and ProBN today in the office.  If you have labs (blood work) drawn today and your tests are completely normal, you will receive your results only by: MyChart Message (if you have MyChart) OR A paper copy in the mail If you have any lab test that is abnormal or we need to change your treatment, we will call you to review the results.   Testing/Procedures:   Your cardiac CT will be scheduled at one of the below locations:   [facility] [address]  [place], [place] [address]   If scheduled at the Heart and Vascular Tower at [address], please enter the parking lot using the [address] entrance and use the FREE valet service at the patient drop-off area. Enter the building and check-in with registration on the main floor.  Please follow these instructions carefully (unless otherwise directed):  An IV will be required for this test and Nitroglycerin will be given.  Hold all erectile dysfunction medications at least 3 days (72 hrs) prior to test. (Ie viagra, cialis, sildenafil, tadalafil, etc)   On the Night Before the Test: Be sure to Drink plenty of water. Do not consume any caffeinated/decaffeinated beverages or chocolate 12 hours prior to your test. Do not take any antihistamines 12 hours prior to your test.  On the Day of the Test: Drink plenty of water until 1 hour prior to the test. Do not eat any food 1 hour prior to test. You may take your regular medications prior to the test.  Take metoprolol  (Lopressor ) two hours prior to test. If you take Furosemid, please HOLD on the morning of the  test.      After the Test: Drink plenty of water. After receiving IV contrast, you may experience a mild flushed feeling. This is normal. On occasion, you may experience a mild rash up to 24 hours after the test. This is not dangerous. If this occurs, you can take Benadryl  25 mg, Zyrtec, Claritin, or Allegra and increase your fluid intake. (Patients taking Tikosyn should avoid Benadryl , and may take Zyrtec, Claritin, or Allegra) If you experience trouble breathing, this can be serious. If it is severe call 911 IMMEDIATELY. If it is mild, please call our office.  We will call to schedule your test 2-4 weeks out understanding that some insurance companies will need an authorization prior to the service being performed.   For more information and frequently asked questions, please visit our website : [email or web address]  For non-scheduling related questions, please contact the cardiac imaging nurse navigator should you have any questions/concerns: Cardiac Imaging Nurse Navigators Direct Office Dial: [phone]   For scheduling needs, including cancellations and rescheduling, please call [place], [phone].    Follow-Up: At [facility], you and your health needs are our priority.  As part of our continuing mission to provide you with exceptional heart care, we have created designated Provider Care Teams.  These Care Teams include your primary Cardiologist (physician) and Advanced Practice Providers (APPs -  Physician Assistants and Nurse Practitioners) who all work together to provide you with the care you need, when you need it.  We recommend signing  up for the patient portal called MyChart.  Sign up information is provided on this After Visit Summary.  MyChart is used to connect with patients for Virtual Visits (Telemedicine).  Patients are able to view lab/test results, encounter notes, upcoming appointments, etc.  Non-urgent messages can be sent to your provider  as well.   To learn more about what you can do with MyChart, go to [email or web address].    Your next appointment:   1 month(s)  The format for your next appointment:   In Person  Provider:   Jennifer Crape, MD    Other Instructions none  Important Information About Sugar

## 2024-07-31 NOTE — Progress Notes (Signed)
 Cardiology Office Note:    Date:  07/31/2024   ID:  Vincent Wiley, DOB 10/16/1941, MRN [id number]  PCP:  Street, Lonni HERO, MD  Cardiologist:  Jennifer JONELLE Crape, MD   Referring MD: [address], Lonni HERO, *    ASSESSMENT:    1. Atherosclerosis of native coronary artery of native heart without angina pectoris   2. Essential (primary) hypertension   3. Paroxysmal atrial fibrillation (HCC)   4. Aneurysm of ascending aorta without rupture (HCC)   5. Idiopathic pulmonary fibrosis (HCC)   6. Obstructive sleep apnea syndrome    PLAN:    In order of problems listed above:  Dyspnea on exertion: Multifactorial in origin possibly I discussed this with the patient at extensive length.  He also has pulmonary issues of interstitial fibrosis.  I would like to rule out ischemic substrate by doing a CT coronary angiography with FFR and he is agreeable.  I also want to set him up for electrophysiology appointment with our colleague to see if he is atrial fibrillation can be fine doing better to help him symptomatically.  He is on consistent anticoagulation. Persistent atrial fibrillation:I discussed with the patient atrial fibrillation, disease process. Management and therapy including rate and rhythm control, anticoagulation benefits and potential risks were discussed extensively with the patient. Patient had multiple questions which were answered to patient's satisfaction.  My question for electrophysiology colleague is to see if he would be a candidate for cardioversion.  It is to be noted that he has some left atrial enlargement. Mixed dyslipidemia: On lipid-lowering medications followed by primary care. Dilated ascending aorta: Stable.  Patient is aware. Patient will be seen in follow-up appointment in 4 weeks or earlier if the patient has any concerns.    Medication Adjustments/Labs and Tests Ordered: Current medicines are reviewed at length with the patient today.  Concerns regarding medicines  are outlined above.  Orders Placed This Encounter  Procedures   EKG 12-Lead   No orders of the defined types were placed in this encounter.    No chief complaint on file.    History of Present Illness:    Vincent Wiley is a 83 y.o. male.  Patient has past medical history of coronary atherosclerosis, ascending aortic aneurysm, essential hypertension, interstitial fibrosis and mixed dyslipidemia.  Recently he has been complaining of shortness of breath on exertion or minimal exertion related.  He has atrial fibrillation.  He denies any chest pain orthopnea or PND.  At the time of my evaluation, the patient is alert awake oriented and in no distress.  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    Chronic GI losses, B12 Deficiency   Ascending aortic aneurysm (HCC) 06/08/2021   Benign essential hypertension    Benign prostatic hyperplasia 01/12/2022   Benign prostatic hyperplasia with lower urinary tract symptoms 01/19/2023   BPH with obstruction/lower urinary tract symptoms    Cardiac murmur 03/31/2021   Chronic respiratory failure with hypoxia (HCC) 09/21/2023   Coronary atherosclerosis 06/08/2021   Diastolic heart failure (HCC) 09/21/2023   DOE (dyspnea on exertion)    Emphysema lung (HCC) 09/21/2023   Encounter for fitting and adjustment of hearing aid 04/19/2022   Encounter for immunization 04/19/2022   Essential (primary) hypertension 01/12/2022   Gastroesophageal reflux disease 01/12/2022   Hyperlipidemia    Idiopathic pulmonary fibrosis (HCC) 01/12/2022   Impacted cerumen, bilateral 04/19/2022   Kidney disease  Obstructive sleep apnea syndrome 04/30/2013   NPSG 01/2012:  AHI 27/hr with desat to 86%.   Other retention of urine 01/19/2023   Paroxysmal atrial fibrillation (HCC) 03/31/2021   Pernicious anemia    Pulmonary fibrosis (HCC) 03/31/2021   Rash 09/21/2023   Seasonal allergies     Secundum atrial septal defect 01/25/2023   Sensorineural hearing loss, bilateral 04/19/2022   Sleep apnea    Vitamin B12 deficiency     Past Surgical History:  Procedure Laterality Date   EYE SURGERY Bilateral    right eye realignment//cataract left eye   HERNIA REPAIR     umbilical   MULTIPLE TOOTH EXTRACTIONS     REPLACEMENT TOTAL KNEE Bilateral     Current Medications: Current Meds  Medication Sig   alfuzosin (UROXATRAL) 10 MG 24 hr tablet Take 10 mg by mouth daily with breakfast.   apixaban  (ELIQUIS ) 5 MG TABS tablet TAKE 1 TABLET TWICE A DAY   bethanechol (URECHOLINE) 50 MG tablet Take 50 mg by mouth 2 (two) times daily.   Cholecalciferol (D3 2000) 50 MCG (2000 UT) CAPS Take 2,000 Units by mouth daily at 12 noon.   Coenzyme Q10 (COQ10) 100 MG CAPS Take 100 mg by mouth daily.   finasteride (PROSCAR) 5 MG tablet Take 5 mg by mouth daily.   fluticasone (FLONASE) 50 MCG/ACT nasal spray Place 1 spray into both nostrils 2 (two) times daily.   furosemide  (LASIX ) 20 MG tablet Take 1 tablet (20 mg total) by mouth daily.   methenamine (HIPREX) 1 g tablet Take 1 g by mouth every morning.   Multiple Vitamins-Minerals (VITAMIN D3 COMPLETE PO) Take 1 tablet by mouth daily.   Nintedanib (OFEV ) 100 MG CAPS Take 1 capsule (100 mg total) by mouth 2 (two) times daily.   pantoprazole (PROTONIX) 40 MG tablet Take 40 mg by mouth 2 (two) times daily.   Probiotic Product (PROBIOTIC DAILY PO) Take 1 tablet by mouth daily.   simvastatin (ZOCOR) 20 MG tablet Take 20 mg by mouth every evening.   sucralfate (CARAFATE) 1 GM/10ML suspension Take 1 g by mouth 2 (two) times daily.   Tiotropium Bromide-Olodaterol (STIOLTO RESPIMAT ) 2.5-2.5 MCG/ACT AERS Inhale 2 puffs into the lungs daily.   valACYclovir (VALTREX) 500 MG tablet Take 500 mg by mouth 2 (two) times daily as needed for other (Fever blisters). Fever blisters     Allergies:   Sulfamethoxazole, Amlodipine, Ceftriaxone, Chlorhexidine gluconate,  Ciprofloxacin, Cortisone acetate [cortisone], Elemental sulfur, Flomax [tamsulosin], Hibiclens [chlorhexidine gluconate], Lisinopril, Other, Penicillins, Probiata [lactobacillus], Rocephin [ceftriaxone sodium in dextrose], and Sulfa antibiotics   Social History   Socioeconomic History   Marital status: Unknown    Spouse name: Not on file   Number of children: Not on file   Years of education: Not on file   Highest education level: Not on file  Occupational History   Occupation: retired  Tobacco Use   Smoking status: Former    Current packs/day: 0.00    Average packs/day: 1.5 packs/day for 25.0 years (37.5 ttl pk-yrs)    Types: Cigarettes    Start date: 08/26/1958    Quit date: 08/27/1983    Years since quitting: 40.9   Smokeless tobacco: Never  Substance and Sexual Activity   Alcohol use: No   Drug use: No   Sexual activity: Not on file  Other Topics Concern   Not on file  Social History Narrative   Not on file   Social Drivers of Corporate investment banker  Strain: Not on file  Food Insecurity: Not on file  Transportation Needs: Not on file  Physical Activity: Not on file  Stress: Not on file  Social Connections: Not on file     Family History: The patient's family history includes AAA (abdominal aortic aneurysm) in his father; Aneurysm in his mother; Breast cancer in his sister and sister; Heart attack in his father.  ROS:   Please see the history of present illness.    All other systems reviewed and are negative.  EKGs/Labs/Other Studies Reviewed:    The following studies were reviewed today: EKG reveals atrial fibrillation with well-controlled ventricular rate.   Recent Labs: 07/23/2024: ALT 8; BUN 16; Creatinine, Ser 1.04; Hemoglobin 12.0; NT-Pro BNP 2,433; Platelets 226; Potassium 3.9; Sodium 144  Recent Lipid Panel    Component Value Date/Time   CHOL 124 01/25/2023 1142   TRIG 70 01/25/2023 1142   HDL 51 01/25/2023 1142   CHOLHDL 2.4 01/25/2023 1142    LDLCALC 59 01/25/2023 1142    Physical Exam:    VS:  BP 128/72   Pulse 98   Ht 5' 10 (1.778 m)   Wt 177 lb 6.4 oz (80.5 kg)   SpO2 93%   BMI 25.45 kg/m     Wt Readings from Last 3 Encounters:  07/31/24 177 lb 6.4 oz (80.5 kg)  07/23/24 181 lb 3.2 oz (82.2 kg)  03/01/24 180 lb 12.8 oz (82 kg)     GEN: Patient is in no acute distress HEENT: Normal NECK: No JVD; No carotid bruits LYMPHATICS: No lymphadenopathy CARDIAC: Hear sounds regular, 2/6 systolic murmur at the apex. RESPIRATORY:  Clear to auscultation without rales, wheezing or rhonchi  ABDOMEN: Soft, non-tender, non-distended MUSCULOSKELETAL:  No edema; No deformity  SKIN: Warm and dry NEUROLOGIC:  Alert and oriented x 3 PSYCHIATRIC:  Normal affect   Signed, Jennifer JONELLE Crape, MD  07/31/2024 1:12 PM    [facility]

## 2024-08-01 ENCOUNTER — Ambulatory Visit: Payer: Self-pay | Admitting: Cardiology

## 2024-08-01 DIAGNOSIS — N289 Disorder of kidney and ureter, unspecified: Secondary | ICD-10-CM

## 2024-08-01 LAB — BASIC METABOLIC PANEL WITH GFR
BUN/Creatinine Ratio: 16 (ref 10–24)
BUN: 23 mg/dL (ref 8–27)
CO2: 22 mmol/L (ref 20–29)
Calcium: 8.9 mg/dL (ref 8.6–10.2)
Chloride: 103 mmol/L (ref 96–106)
Creatinine, Ser: 1.45 mg/dL — ABNORMAL HIGH (ref 0.76–1.27)
Glucose: 134 mg/dL — ABNORMAL HIGH (ref 70–99)
Potassium: 3.7 mmol/L (ref 3.5–5.2)
Sodium: 141 mmol/L (ref 134–144)
eGFR: 48 mL/min/1.73 — ABNORMAL LOW (ref >59)

## 2024-08-01 LAB — PRO B NATRIURETIC PEPTIDE: NT-Pro BNP: 1712 pg/mL — ABNORMAL HIGH (ref 0–486)

## 2024-08-01 NOTE — Telephone Encounter (Signed)
-----   Message from [place] Revankar sent at 08/01/2024  8:40 AM EDT ----- I am not sure with that renal function but I want him to get CT scan at this point.  Lets hold off on the CT coronary angiography.  Not sure when it was scheduled to happen I want him to get a Chem-7  in a week's time.  Jennifer JONELLE Crape, MD 08/01/2024 8:39 AM  ----- Message ----- From: Rebecka Memos Lab Results In Sent: 08/01/2024   5:37 AM EDT To: Jennifer JONELLE Crape, MD

## 2024-08-01 NOTE — Telephone Encounter (Signed)
Results reviewed with pt as per Dr. Revankar's note.  Pt verbalized understanding and had no additional questions. Routed to PCP.  

## 2024-08-02 DIAGNOSIS — G4733 Obstructive sleep apnea (adult) (pediatric): Secondary | ICD-10-CM | POA: Diagnosis not present

## 2024-08-02 DIAGNOSIS — D519 Vitamin B12 deficiency anemia, unspecified: Secondary | ICD-10-CM | POA: Diagnosis not present

## 2024-08-05 DIAGNOSIS — N289 Disorder of kidney and ureter, unspecified: Secondary | ICD-10-CM | POA: Diagnosis not present

## 2024-08-06 LAB — BASIC METABOLIC PANEL WITH GFR
BUN/Creatinine Ratio: 15 (ref 10–24)
BUN: 19 mg/dL (ref 8–27)
CO2: 19 mmol/L — ABNORMAL LOW (ref 20–29)
Calcium: 8.6 mg/dL (ref 8.6–10.2)
Chloride: 105 mmol/L (ref 96–106)
Creatinine, Ser: 1.29 mg/dL — ABNORMAL HIGH (ref 0.76–1.27)
Glucose: 111 mg/dL — ABNORMAL HIGH (ref 70–99)
Potassium: 3.8 mmol/L (ref 3.5–5.2)
Sodium: 143 mmol/L (ref 134–144)
eGFR: 55 mL/min/1.73 — ABNORMAL LOW (ref >59)

## 2024-08-08 ENCOUNTER — Ambulatory Visit (HOSPITAL_COMMUNITY): Admission: RE | Admit: 2024-08-08 | Source: Ambulatory Visit | Attending: Cardiology | Admitting: Cardiology

## 2024-08-13 DIAGNOSIS — L578 Other skin changes due to chronic exposure to nonionizing radiation: Secondary | ICD-10-CM | POA: Diagnosis not present

## 2024-08-13 DIAGNOSIS — L570 Actinic keratosis: Secondary | ICD-10-CM | POA: Diagnosis not present

## 2024-08-13 DIAGNOSIS — L821 Other seborrheic keratosis: Secondary | ICD-10-CM | POA: Diagnosis not present

## 2024-08-14 ENCOUNTER — Encounter: Payer: Self-pay | Admitting: Cardiology

## 2024-08-14 DIAGNOSIS — R972 Elevated prostate specific antigen [PSA]: Secondary | ICD-10-CM | POA: Diagnosis not present

## 2024-08-14 DIAGNOSIS — N401 Enlarged prostate with lower urinary tract symptoms: Secondary | ICD-10-CM | POA: Diagnosis not present

## 2024-08-14 DIAGNOSIS — R339 Retention of urine, unspecified: Secondary | ICD-10-CM | POA: Diagnosis not present

## 2024-08-15 ENCOUNTER — Telehealth (HOSPITAL_COMMUNITY): Payer: Self-pay | Admitting: Emergency Medicine

## 2024-08-15 NOTE — Telephone Encounter (Signed)
 Reaching out to patient to offer assistance regarding upcoming cardiac imaging study; pt verbalizes understanding of appt date/time, parking situation and where to check in, pre-test NPO status and medications ordered, and verified current allergies; name and call back number provided for further questions should they arise Rockwell Alexandria RN Navigator Cardiac Imaging [facility] [phone] office [phone] cell

## 2024-08-16 ENCOUNTER — Ambulatory Visit (HOSPITAL_COMMUNITY)
Admission: RE | Admit: 2024-08-16 | Discharge: 2024-08-16 | Disposition: A | Discharge status: Home / Self Care | Source: Ambulatory Visit | Attending: Cardiology | Admitting: Cardiology

## 2024-08-16 VITALS — BP 110/81 | HR 68

## 2024-08-16 DIAGNOSIS — I700 Atherosclerosis of aorta: Secondary | ICD-10-CM | POA: Diagnosis not present

## 2024-08-16 DIAGNOSIS — K449 Diaphragmatic hernia without obstruction or gangrene: Secondary | ICD-10-CM | POA: Insufficient documentation

## 2024-08-16 DIAGNOSIS — R931 Abnormal findings on diagnostic imaging of heart and coronary circulation: Secondary | ICD-10-CM | POA: Insufficient documentation

## 2024-08-16 DIAGNOSIS — I517 Cardiomegaly: Secondary | ICD-10-CM | POA: Insufficient documentation

## 2024-08-16 DIAGNOSIS — I2510 Atherosclerotic heart disease of native coronary artery without angina pectoris: Secondary | ICD-10-CM | POA: Diagnosis not present

## 2024-08-16 DIAGNOSIS — R0609 Other forms of dyspnea: Secondary | ICD-10-CM

## 2024-08-16 MED ORDER — DIPHENHYDRAMINE HCL 50 MG/ML IJ SOLN
50.0000 mg | Freq: Once | INTRAMUSCULAR | Status: AC
  Administered 2024-08-16: 50 mg via INTRAVENOUS

## 2024-08-16 MED ORDER — IOHEXOL 350 MG/ML SOLN
100.0000 mL | Freq: Once | INTRAVENOUS | Status: AC | PRN
  Administered 2024-08-16: 100 mL via INTRAVENOUS

## 2024-08-16 MED ORDER — NITROGLYCERIN 0.4 MG SL SUBL
0.8000 mg | SUBLINGUAL_TABLET | Freq: Once | SUBLINGUAL | Status: AC
  Administered 2024-08-16: 0.8 mg via SUBLINGUAL

## 2024-08-16 NOTE — Progress Notes (Signed)
 Patient presents for a cardiac CT scan and developed hives on both sides of his chest and on left arm.  Dr. Loni notified and she came to the scanner to evaluate the patient.  Patient was given 50mg  IV benadryl  and taken to the nurse's station for observation.  ABCD's intact. Education was given to patient that he would need steroid prep for all future CT studies required IV contrast. He verbalized understanding.  Chantal Requena RN Navigator Cardiac Imaging [facility] [phone] Office [phone] Cell

## 2024-08-19 ENCOUNTER — Encounter: Payer: Self-pay | Admitting: Cardiology

## 2024-08-19 ENCOUNTER — Ambulatory Visit
Admission: RE | Admit: 2024-08-19 | Discharge: 2024-08-19 | Disposition: A | Discharge status: Home / Self Care | Source: Ambulatory Visit | Attending: Cardiology | Admitting: Cardiology

## 2024-08-19 ENCOUNTER — Ambulatory Visit (INDEPENDENT_AMBULATORY_CARE_PROVIDER_SITE_OTHER): Admitting: Cardiology

## 2024-08-19 VITALS — BP 110/76 | HR 89 | Ht 70.0 in | Wt 176.0 lb

## 2024-08-19 DIAGNOSIS — I10 Essential (primary) hypertension: Secondary | ICD-10-CM

## 2024-08-19 DIAGNOSIS — I2510 Atherosclerotic heart disease of native coronary artery without angina pectoris: Secondary | ICD-10-CM | POA: Diagnosis not present

## 2024-08-19 DIAGNOSIS — R931 Abnormal findings on diagnostic imaging of heart and coronary circulation: Secondary | ICD-10-CM | POA: Diagnosis not present

## 2024-08-19 DIAGNOSIS — Z01812 Encounter for preprocedural laboratory examination: Secondary | ICD-10-CM

## 2024-08-19 DIAGNOSIS — I4819 Other persistent atrial fibrillation: Secondary | ICD-10-CM | POA: Insufficient documentation

## 2024-08-19 DIAGNOSIS — D6869 Other thrombophilia: Secondary | ICD-10-CM | POA: Diagnosis not present

## 2024-08-19 MED ORDER — AMIODARONE HCL 200 MG PO TABS: ORAL_TABLET | ORAL | 3 refills | Status: DC

## 2024-08-19 NOTE — Patient Instructions (Addendum)
 Medication Instructions:  Your physician has recommended you make the following change in your medication:  START Amiodarone   - take 2 tablets (400 mg total) TWICE a day for 2 weeks, then  - take 1 tablet (200 mg total) TWICE a day for 2 weeks, then  - take 1 tablet (200 mg total) ONCE a day   *If you need a refill on your cardiac medications before your next appointment, please call your pharmacy*   Lab Work: Pre procedure labs -- we will call you to schedule:  BMP & CBC  If you have a lab test that is abnormal and we need to change your treatment, we will call you to review the results -- otherwise no news is good news.    Testing/Procedures: Your physician has recommended that you have a Cardioversion (DCCV). Electrical Cardioversion uses a jolt of electricity to your heart either through paddles or wired patches attached to your chest. This is a controlled, usually prescheduled, procedure. Defibrillation is done under light anesthesia in the hospital, and you usually go home the day of the procedure. This is done to get your heart back into a normal rhythm. You are not awake for the procedure.  Please see the instruction sheet below located under other instructions.    Your physician has recommended that you have an ablation. Catheter ablation is a medical procedure used to treat some cardiac arrhythmias (irregular heartbeats). During catheter ablation, a long, thin, flexible tube is put into a blood vessel in your groin (upper thigh), or neck. This tube is called an ablation catheter. It is then guided to your heart through the blood vessel. Radio frequency waves destroy small areas of heart tissue where abnormal heartbeats may cause an arrhythmia to start.   The office will call you to schedule this procedure later this year, around November/December.   Follow-Up: At [facility], you and your health needs are our priority.  As part of our continuing mission to provide you with  exceptional heart care, we have created designated Provider Care Teams.  These Care Teams include your primary Cardiologist (physician) and Advanced Practice Providers (APPs -  Physician Assistants and Nurse Practitioners) who all work together to provide you with the care you need, when you need it.  Your next appointment:   1 month(s) after your ablation  The format for your next appointment:   In Person  Provider:   AFib clinic   Thank you for choosing [facility]!!   Maeola Domino, RN [phone]    Other Instructions      Dear Vincent Wiley  You are scheduled for a Cardioversion on Friday, September 19 with Dr. Francyne.  Please arrive at the [facility] (Main Entrance A) at [facility]: [address] [place], [place] [address] at 9:30 AM (This time is 1 hour(s) before your procedure to ensure your preparation).   Free valet parking service is available. You will check in at ADMITTING.   *Please Note: You will receive a call the day before your procedure to confirm the appointment time. That time may have changed from the original time based on the schedule for that day.*    DIET:  Nothing to eat or drink after midnight except a sip of water with medications (see medication instructions below)  MEDICATION INSTRUCTIONS: !!IF ANY NEW MEDICATIONS ARE STARTED AFTER TODAY, PLEASE NOTIFY YOUR PROVIDER AS SOON AS POSSIBLE!!  FYI: Medications such as Semaglutide (Ozempic, [place]), Tirzepatide (Mounjaro, Zepbound), Dulaglutide (Trulicity),  etc (GLP1 agonists) AND Canagliflozin (Invokana), Dapagliflozin (Farxiga), Empagliflozin (Jardiance), Ertugliflozin (Steglatro), Bexagliflozin [employer]) or any combination with one of these drugs such as Invokamet (Canagliflozin/Metformin), Synjardy (Empagliflozin/Metformin), etc (SGLT2 inhibitors) must be held around the time of a procedure. This is not a comprehensive list of all of these drugs. Please review all of your  medications and talk to your provider if you take any one of these. If you are not sure, ask your provider.   Hold your morning medications, ONLY TAKE YOUR ELIQUIS    Continue taking your anticoagulant (blood thinner): Apixaban  (Eliquis ).  You will need to continue this after your procedure until you are told by your provider that it is safe to stop.    LABS:   Come to [place] office between 9/15 - 9/17 for labs, you do NOT need to be fasting  FYI:  For your safety, and to allow us  to monitor your vital signs accurately during the surgery/procedure we request: If you have artificial nails, gel coating, SNS etc, please have those removed prior to your surgery/procedure. Not having the nail coverings /polish removed may result in cancellation or delay of your surgery/procedure.  Your support person will be asked to wait in the waiting room during your procedure.  It is OK to have someone drop you off and come back when you are ready to be discharged.  You cannot drive after the procedure and will need someone to drive you home.  Bring your insurance cards.  *Special Note: Every effort is made to have your procedure done on time. Occasionally there are emergencies that occur at the hospital that may cause delays. Please be patient if a delay does occur.        Cardiac Ablation Cardiac ablation is a procedure to destroy (ablate) some heart tissue that is sending bad signals. These bad signals cause problems in heart rhythm. The heart has many areas that make these signals. If there are problems in these areas, they can make the heart beat in a way that is not normal. Destroying some tissues can help make the heart rhythm normal. Tell your doctor about: Any allergies you have. All medicines you are taking. These include vitamins, herbs, eye drops, creams, and over-the-counter medicines. Any problems you or family members have had with medicines that make you fall asleep (anesthetics). Any  blood disorders you have. Any surgeries you have had. Any medical conditions you have, such as kidney failure. Whether you are pregnant or may be pregnant. What are the risks? This is a safe procedure. But problems may occur, including: Infection. Bruising and bleeding. Bleeding into the chest. Stroke or blood clots. Damage to nearby areas of your body. Allergies to medicines or dyes. The need for a pacemaker if the normal system is damaged. Failure of the procedure to treat the problem. What happens before the procedure? Medicines Ask your doctor about: Changing or stopping your normal medicines. This is important. Taking aspirin and ibuprofen. Do not take these medicines unless your doctor tells you to take them. Taking other medicines, vitamins, herbs, and supplements. General instructions Follow instructions from your doctor about what you cannot eat or drink. Plan to have someone take you home from the hospital or clinic. If you will be going home right after the procedure, plan to have someone with you for 24 hours. Ask your doctor what steps will be taken to prevent infection. What happens during the procedure?  An IV tube will be put into one of your  veins. You will be given a medicine to help you relax. The skin on your neck or groin will be numbed. A cut (incision) will be made in your neck or groin. A needle will be put through your cut and into a large vein. A tube (catheter) will be put into the needle. The tube will be moved to your heart. Dye may be put through the tube. This helps your doctor see your heart. Small devices (electrodes) on the tube will send out signals. A type of energy will be used to destroy some heart tissue. The tube will be taken out. Pressure will be held on your cut. This helps stop bleeding. A bandage will be put over your cut. The exact procedure may vary among doctors and hospitals. What happens after the procedure? You will be watched  until you leave the hospital or clinic. This includes checking your heart rate, breathing rate, oxygen , and blood pressure. Your cut will be watched for bleeding. You will need to lie still for a few hours. Do not drive for 24 hours or as long as your doctor tells you. Summary Cardiac ablation is a procedure to destroy some heart tissue. This is done to treat heart rhythm problems. Tell your doctor about any medical conditions you may have. Tell him or her about all medicines you are taking to treat them. This is a safe procedure. But problems may occur. These include infection, bruising, bleeding, and damage to nearby areas of your body. Follow what your doctor tells you about food and drink. You may also be told to change or stop some of your medicines. After the procedure, do not drive for 24 hours or as long as your doctor tells you. This information is not intended to replace advice given to you by your health care provider. Make sure you discuss any questions you have with your health care provider. Document Revised: 03/04/2022 Document Reviewed: 11/14/2019 Elsevier Patient Education  2023 Elsevier Inc.   Cardiac Ablation, Care After  This sheet gives you information about how to care for yourself after your procedure. Your health care provider may also give you more specific instructions. If you have problems or questions, contact your health care provider. What can I expect after the procedure? After the procedure, it is common to have: Bruising around your puncture site. Tenderness around your puncture site. Skipped heartbeats. If you had an atrial fibrillation ablation, you may have atrial fibrillation during the first several months after your procedure.  Tiredness (fatigue).  Follow these instructions at home: Puncture site care  Follow instructions from your health care provider about how to take care of your puncture site. Make sure you: If present, leave stitches (sutures),  skin glue, or adhesive strips in place. These skin closures may need to stay in place for up to 2 weeks. If adhesive strip edges start to loosen and curl up, you may trim the loose edges. Do not remove adhesive strips completely unless your health care provider tells you to do that. If a large square bandage is present, this may be removed 24 hours after surgery.  Check your puncture site every day for signs of infection. Check for: Redness, swelling, or pain. Fluid or blood. If your puncture site starts to bleed, lie down on your back, apply firm pressure to the area, and contact your health care provider. Warmth. Pus or a bad smell. A pea or small marble sized lump at the site is normal and can take up  to three months to resolve.  Driving Do not drive for at least 4 days after your procedure or however long your health care provider recommends. (Do not resume driving if you have previously been instructed not to drive for other health reasons.) Do not drive or use heavy machinery while taking prescription pain medicine. Activity Avoid activities that take a lot of effort for at least 7 days after your procedure. Do not lift anything that is heavier than 5 lb (4.5 kg) for one week.  No sexual activity for 1 week.  Return to your normal activities as told by your health care provider. Ask your health care provider what activities are safe for you. General instructions Take over-the-counter and prescription medicines only as told by your health care provider. Do not use any products that contain nicotine or tobacco, such as cigarettes and e-cigarettes. If you need help quitting, ask your health care provider. You may shower after 24 hours, but Do not take baths, swim, or use a hot tub for 1 week.  Do not drink alcohol for 24 hours after your procedure. Keep all follow-up visits as told by your health care provider. This is important. Contact a health care provider if: You have redness, mild  swelling, or pain around your puncture site. You have fluid or blood coming from your puncture site that stops after applying firm pressure to the area. Your puncture site feels warm to the touch. You have pus or a bad smell coming from your puncture site. You have a fever. You have chest pain or discomfort that spreads to your neck, jaw, or arm. You have chest pain that is worse with lying on your back or taking a deep breath. You are sweating a lot. You feel nauseous. You have a fast or irregular heartbeat. You have shortness of breath. You are dizzy or light-headed and feel the need to lie down. You have pain or numbness in the arm or leg closest to your puncture site. Get help right away if: Your puncture site suddenly swells. Your puncture site is bleeding and the bleeding does not stop after applying firm pressure to the area. These symptoms may represent a serious problem that is an emergency. Do not wait to see if the symptoms will go away. Get medical help right away. Call your local emergency services (911 in the [place] Do not drive yourself to the hospital. Summary After the procedure, it is normal to have bruising and tenderness at the puncture site in your groin, neck, or forearm. Check your puncture site every day for signs of infection. Get help right away if your puncture site is bleeding and the bleeding does not stop after applying firm pressure to the area. This is a medical emergency. This information is not intended to replace advice given to you by your health care provider. Make sure you discuss any questions you have with your health care provider.

## 2024-08-19 NOTE — Progress Notes (Signed)
  Electrophysiology Office Note:   Date:  08/19/2024  ID:  Vincent Wiley, DOB [id number], MRN [id number]  Primary Cardiologist: None Primary Heart Failure: None Electrophysiologist: None      History of Present Illness:   Vincent Wiley is a 83 y.o. male with h/o hypertension, coronary atherosclerosis, chronic diastolic heart failure, COPD, pulmonary fibrosis, sleep apnea seen today for  for Electrophysiology evaluation of atrial fibrillation at the request of Rajan Revankar.    He has had atrial fibrillation for a few years.  Initially, he was on amiodarone .  Amiodarone  was stopped due to chronic lung disease.  He does have baseline shortness of breath.  Over the last 4 weeks, his shortness of breath really has gotten significantly worse.  He was daily activities due to his level of fatigue.  Prior to this, he was quite active.  He was able to do his daily activities without any restriction.  Review of systems complete and found to be negative unless listed in HPI.   EP Information / Studies Reviewed:    EKG is ordered today. Personal review as below.        Risk Assessment/Calculations:    CHA2DS2-VASc Score = 5   This indicates a 7.2% annual risk of stroke. The patient's score is based upon: CHF History: 1 HTN History: 1 Diabetes History: 0 Stroke History: 0 Vascular Disease History: 1 Age Score: 2 Gender Score: 0             Physical Exam:   VS:  BP 110/76   Pulse 89   Ht 5' 10 (1.778 m)   Wt 176 lb (79.8 kg)   SpO2 93%   BMI 25.25 kg/m    Wt Readings from Last 3 Encounters:  08/19/24 176 lb (79.8 kg)  07/31/24 177 lb 6.4 oz (80.5 kg)  07/23/24 181 lb 3.2 oz (82.2 kg)     GEN: Well nourished, well developed in no acute distress NECK: No JVD; No carotid bruits CARDIAC: Irregularly irregular rate and rhythm, no murmurs, rubs, gallops RESPIRATORY:  Clear to auscultation without rales, wheezing or rhonchi  ABDOMEN: Soft, non-tender, non-distended EXTREMITIES:   No edema; No deformity   ASSESSMENT AND PLAN:    1.  Persistent atrial fibrillation: He feels quite poorly in atrial fibrillation.  He has significant weakness, fatigue, shortness of breath.  He would prefer rhythm control at this time.  He was previously on amiodarone  but this was stopped due to his chronic lung issues.  Lajune Perine restart amiodarone  and plan for cardioversion.  Amiodarone  should be a short-term medication.  Torra Pala plan for ablation.  Risk, benefits, and alternatives to EP study and radiofrequency/pulse field ablation for afib were also discussed in detail today. These risks include but are not limited to stroke, bleeding, vascular damage, tamponade, perforation, damage to the esophagus, lungs, and other structures, pulmonary vein stenosis, worsening renal function, and death. The patient understands these risk and wishes to proceed.  We Avika Carbine therefore proceed with catheter ablation at the next available time.  Carto, ICE, anesthesia are requested for the procedure.  This patient Jacquette Canales NOT require CT prior to ablation  2.  Coronary atherosclerosis: No current chest pain.  Plan per primary cardiology.  3.  Hypertension: Well-controlled  5.  Secondary hypercoagulable state: On Eliquis   Follow up with Afib Clinic as usual post procedure  Signed, Paisly Fingerhut Gladis Norton, MD

## 2024-08-22 ENCOUNTER — Ambulatory Visit: Admitting: Cardiology

## 2024-08-26 DIAGNOSIS — R339 Retention of urine, unspecified: Secondary | ICD-10-CM | POA: Diagnosis not present

## 2024-08-26 DIAGNOSIS — N401 Enlarged prostate with lower urinary tract symptoms: Secondary | ICD-10-CM | POA: Diagnosis not present

## 2024-08-27 DIAGNOSIS — G4733 Obstructive sleep apnea (adult) (pediatric): Secondary | ICD-10-CM | POA: Diagnosis not present

## 2024-08-27 DIAGNOSIS — Z01812 Encounter for preprocedural laboratory examination: Secondary | ICD-10-CM | POA: Diagnosis not present

## 2024-08-27 DIAGNOSIS — I4819 Other persistent atrial fibrillation: Secondary | ICD-10-CM | POA: Diagnosis not present

## 2024-08-28 LAB — CBC
Hematocrit: 37.2 % — ABNORMAL LOW (ref 37.5–51.0)
Hemoglobin: 12.2 g/dL — ABNORMAL LOW (ref 13.0–17.7)
MCH: 32.5 pg (ref 26.6–33.0)
MCHC: 32.8 g/dL (ref 31.5–35.7)
MCV: 99 fL — ABNORMAL HIGH (ref 79–97)
Platelets: 195 x10E3/uL (ref 150–450)
RBC: 3.75 x10E6/uL — ABNORMAL LOW (ref 4.14–5.80)
RDW: 12.4 % (ref 11.6–15.4)
WBC: 8.7 x10E3/uL (ref 3.4–10.8)

## 2024-08-28 LAB — BASIC METABOLIC PANEL WITH GFR
BUN/Creatinine Ratio: 13 (ref 10–24)
BUN: 19 mg/dL (ref 8–27)
CO2: 22 mmol/L (ref 20–29)
Calcium: 8.7 mg/dL (ref 8.6–10.2)
Chloride: 104 mmol/L (ref 96–106)
Creatinine, Ser: 1.44 mg/dL — ABNORMAL HIGH (ref 0.76–1.27)
Glucose: 119 mg/dL — ABNORMAL HIGH (ref 70–99)
Potassium: 3.7 mmol/L (ref 3.5–5.2)
Sodium: 142 mmol/L (ref 134–144)
eGFR: 48 mL/min/1.73 — ABNORMAL LOW (ref >59)

## 2024-09-02 DIAGNOSIS — G4733 Obstructive sleep apnea (adult) (pediatric): Secondary | ICD-10-CM | POA: Diagnosis not present

## 2024-09-02 DIAGNOSIS — E538 Deficiency of other specified B group vitamins: Secondary | ICD-10-CM | POA: Diagnosis not present

## 2024-09-03 ENCOUNTER — Ambulatory Visit
Admission: RE | Admit: 2024-09-03 | Discharge: 2024-09-03 | Disposition: A | Discharge status: Home / Self Care | Source: Ambulatory Visit | Attending: Pulmonary Disease | Admitting: Pulmonary Disease

## 2024-09-03 ENCOUNTER — Ambulatory Visit: Admitting: Cardiology

## 2024-09-03 DIAGNOSIS — J84112 Idiopathic pulmonary fibrosis: Secondary | ICD-10-CM

## 2024-09-03 DIAGNOSIS — J439 Emphysema, unspecified: Secondary | ICD-10-CM | POA: Diagnosis not present

## 2024-09-03 DIAGNOSIS — J849 Interstitial pulmonary disease, unspecified: Secondary | ICD-10-CM | POA: Diagnosis not present

## 2024-09-03 DIAGNOSIS — Z5181 Encounter for therapeutic drug level monitoring: Secondary | ICD-10-CM

## 2024-09-04 ENCOUNTER — Other Ambulatory Visit: Payer: Self-pay

## 2024-09-04 ENCOUNTER — Ambulatory Visit: Attending: Cardiology

## 2024-09-04 VITALS — BP 115/62 | HR 80 | Ht 69.6 in | Wt 176.8 lb

## 2024-09-04 DIAGNOSIS — I2510 Atherosclerotic heart disease of native coronary artery without angina pectoris: Secondary | ICD-10-CM | POA: Diagnosis not present

## 2024-09-04 DIAGNOSIS — I5032 Chronic diastolic (congestive) heart failure: Secondary | ICD-10-CM | POA: Diagnosis not present

## 2024-09-04 DIAGNOSIS — I10 Essential (primary) hypertension: Secondary | ICD-10-CM

## 2024-09-04 DIAGNOSIS — I7121 Aneurysm of the ascending aorta, without rupture: Secondary | ICD-10-CM | POA: Diagnosis not present

## 2024-09-04 DIAGNOSIS — Q2111 Secundum atrial septal defect: Secondary | ICD-10-CM

## 2024-09-04 DIAGNOSIS — E782 Mixed hyperlipidemia: Secondary | ICD-10-CM

## 2024-09-04 MED ORDER — APIXABAN 5 MG PO TABS: 5.0000 mg | ORAL_TABLET | Freq: Two times a day (BID) | ORAL | 3 refills | Status: AC

## 2024-09-04 NOTE — Patient Instructions (Signed)
 Medication Instructions:  Your physician recommends that you continue on your current medications as directed. Please refer to the Current Medication list given to you today.  *If you need a refill on your cardiac medications before your next appointment, please call your pharmacy*  Lab Work: None If you have labs (blood work) drawn today and your tests are completely normal, you will receive your results only by: MyChart Message (if you have MyChart) OR A paper copy in the mail If you have any lab test that is abnormal or we need to change your treatment, we will call you to review the results.  Testing/Procedures: None  Follow-Up: At [facility], you and your health needs are our priority.  As part of our continuing mission to provide you with exceptional heart care, our providers are all part of one team.  This team includes your primary Cardiologist (physician) and Advanced Practice Providers or APPs (Physician Assistants and Nurse Practitioners) who all work together to provide you with the care you need, when you need it.  Your next appointment:   6 month(s)  Provider:   Belva Crome, MD    We recommend signing up for the patient portal called "MyChart".  Sign up information is provided on this After Visit Summary.  MyChart is used to connect with patients for Virtual Visits (Telemedicine).  Patients are able to view lab/test results, encounter notes, upcoming appointments, etc.  Non-urgent messages can be sent to your provider as well.   To learn more about what you can do with MyChart, go to [email or web address].   Other Instructions None

## 2024-09-04 NOTE — Assessment & Plan Note (Signed)
 There is mention of ASD on prior echocardiogram from April 2025, this was reviewed by Dr. Revankar and recommended medical therapy.  I am unable to review the echocardiogram images myself today.  Cardiac CT images recently from August 2025 however show no significant evidence of atrial septal defect, a PFO cannot be entirely excluded but I do not see any clear opacification of the right atrium that can be expected with a significant atrial septal defect.  Given overall comorbidities, no further diagnostic investigation required at this time.

## 2024-09-04 NOTE — Progress Notes (Signed)
 Cardiology Consultation:    Date:  09/04/2024   ID:  Vincent Wiley, DOB November 19, 1941, MRN [id number]  PCP:  Street, Lonni HERO, MD  Cardiologist:  Alean SAUNDERS Byron Peacock, MD   Referring MD: Street, Lonni HERO, *   No chief complaint on file.    ASSESSMENT AND PLAN:   [name] 83 year old male history of hypertension, severe coronary atherosclerosis without any significant obstructive disease on cardiac CT coronary imaging from August 2025, chronic diastolic heart failure, COPD, pulmonary fibrosis, sleep apnea. Echocardiogram April 2025 with LVEF 60 to 65%, grade 2 diastolic dysfunction, moderately elevated RVSP 53 mmHg with normal RV function, mild aortic insufficiency and mild MR ascending aorta 4 cm in size similar to cardiac CT imaging that noted aortic root 4 cm in size.  He was on amiodarone  for rhythm control for several years. I discontinued amiodarone  in July 2025 while he presented to [facility] for a scheduled cardioversion, and I canceled the procedure and recommended rate control as his symptoms seem to be related to his underlying pulmonary issues and chronic diastolic heart failure.  With his request to pursue rhythm control EP plans ablation in the near future but in the meantime scheduled for cardioversion and restarted amiodarone  oral loading.  Now here for follow-up visit he is back in regular rhythm. At least for the past few days based on his pulse ox readings at home, however he continues to feel similar with his symptoms.  No significant obstructive disease of the coronaries to explain his symptoms on cardiac CT coronary imaging August 2025.  I had a frank discussion with him with regards to his symptoms predominantly due to his underlying lung issues. Fluid retention in the setting of chronic diastolic heart failure likely contributing to an extent.  I explained to him now that he is in rhythm control, his symptoms not significantly different  points more towards a pulmonary cause of his symptoms.  I would defer further A-fib rate versus rhythm control options to electrophysiologist. Recommended salt restriction to below 2 g/day and fluid restriction monitor daily weights. Continue with furosemide  20 mg daily. Additional dose of furosemide  on days where weight goes up by 2 to 3 pounds in a day or 4 to 5 pounds in a week.  Will send out a refill for Eliquis .  Return for follow-up with Dr. Revankar in 6 months. Problem List Items Addressed This Visit     Hyperlipidemia - Primary   Relevant Medications   telmisartan (MICARDIS) 20 MG tablet   Other Relevant Orders   EKG 12-Lead (Completed)   Benign essential hypertension   Relevant Medications   telmisartan (MICARDIS) 20 MG tablet   Coronary atherosclerosis   Relevant Medications   telmisartan (MICARDIS) 20 MG tablet   Ascending aortic aneurysm (HCC)   Relevant Medications   telmisartan (MICARDIS) 20 MG tablet   Secundum atrial septal defect   There is mention of ASD on prior echocardiogram from April 2025, this was reviewed by Dr. Edwyna and recommended medical therapy.  I am unable to review the echocardiogram images myself today.  Cardiac CT images recently from August 2025 however show no significant evidence of atrial septal defect, a PFO cannot be entirely excluded but I do not see any clear opacification of the right atrium that can be expected with a significant atrial septal defect.  Given overall comorbidities, no further diagnostic investigation required at this time.      Relevant Medications   telmisartan (MICARDIS) 20 MG  tablet   Diastolic heart failure (HCC)   Relevant Medications   telmisartan (MICARDIS) 20 MG tablet   Assessment and plan as discussed above. Okay to hold off on cardioversion that was planned for later this month given that he is back in sinus rhythm. Will send out a note to electrophysiologist team to review further plans for  ablation as being considered.  Recommended he continue to keep close follow-up with his pulmonologist given his underlying pulmonary fibrosis history and ongoing treatment with amiodarone .    History of Present Illness:    Vincent Wiley is a 83 y.o. male who is being seen today for follow-up visit. PCP Street, [place], *. Follows up with Dr. Edwyna at our office. Also follows up with Dr. Inocencio in electrophysiology and last visit with him was 08/19/2024  Currently scheduled for TEE cardioversion 09/13/2024 with Dr. Francyne at [facility].  Here for the visit today accompanied by his wife.   Has history of hypertension, coronary atherosclerosis, chronic diastolic heart failure, COPD, pulmonary fibrosis, sleep apnea. He was on amiodarone  for rhythm control for several years, recently reverted back to atrial fibrillation and was feeling poorly. There was concern about underlying A-fib contributing to his symptoms and he was scheduled for TEE cardioversion at [facility].  Have seen him for that scheduled TEE cardioversion. However his rates were well-controlled and given his underlying lung issues felt poor candidate to be on amiodarone  long-term and recommended holding off on cardioversion as his rates are controlled and proceed with electrophysiologist evaluation for other rhythm control options.  He has since seen Dr. Inocencio and rhythm control was discussed and is planned for short-term rhythm control with amiodarone  while ablation for rate will be scheduled.  In the meantime Dr. Edwyna also requested him to undergo cardiac CT coronary angiogram to evaluate for symptoms of shortness of breath if they are angina related. Cardiac CT coronary angiogram completed 08/16/2024 notes severe coronary atherosclerosis with total plaque volume 795 mm cube, calcium score 639, there is a small caliber second diagonal branch small vessel noted lesion proximally which appears  to be severely stenotic with borderline CT FFR other than that less of the coronary distribution appears mild to moderate nonobstructive.  Mildly dilated aortic root 4 cm in size.  Aortic atherosclerosis observed.  Pulmonary artery mildly dilated suggestive of pulmonary hypertension.  Extracardiac findings were notable for moderate UIP pattern fibrosis which has been previously known and moderate hiatal hernia.  EKG in the clinic today shows sinus rhythm heart rate 80/min, PR interval 180 ms, QRS duration 116 ms, incomplete RBBB morphology, no ischemic changes.  In comparison to prior EKG from 08/19/2024 sinus rhythm has replaced A-fib.  Had extensive discussion with him and his wife once again about his underlying health issues.  Initially he mentions he was not aware he was taking amiodarone  for multiple years until it was discontinued at his canceled cardioversion procedure with me at [facility]. Now that amiodarone  has been resumed again, mentions over the last week at home his pulse ox has been showing more irregular heart rates. Consistent with that he is now in sinus rhythm.  He however continues to report symptoms of shortness of breath and mentions overall no significant change in his functional capacity. Gets out of breath with minimal ambulation.  Denies any chest pain at rest. Denies any palpitations, lightheadedness, dizziness or syncopal episodes.  Good compliance with medications. Requesting refill of Eliquis  as he has only 2-week  supply left and medications need to come through [facility]. 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    Chronic GI losses, B12 Deficiency   Ascending aortic aneurysm (HCC) 06/08/2021   Benign essential hypertension    Benign prostatic hyperplasia 01/12/2022   Benign prostatic hyperplasia with lower urinary tract symptoms 01/19/2023   BPH with  obstruction/lower urinary tract symptoms    Cardiac murmur 03/31/2021   Chronic respiratory failure with hypoxia (HCC) 09/21/2023   Coronary atherosclerosis 06/08/2021   Diastolic heart failure (HCC) 09/21/2023   DOE (dyspnea on exertion)    Emphysema lung (HCC) 09/21/2023   Encounter for fitting and adjustment of hearing aid 04/19/2022   Encounter for immunization 04/19/2022   Essential (primary) hypertension 01/12/2022   Gastroesophageal reflux disease 01/12/2022   Hyperlipidemia    Idiopathic pulmonary fibrosis (HCC) 01/12/2022   Impacted cerumen, bilateral 04/19/2022   Kidney disease    Obstructive sleep apnea syndrome 04/30/2013   NPSG 01/2012:  AHI 27/hr with desat to 86%.   Other retention of urine 01/19/2023   Paroxysmal atrial fibrillation (HCC) 03/31/2021   Pernicious anemia    Pulmonary fibrosis (HCC) 03/31/2021   Rash 09/21/2023   Seasonal allergies    Secundum atrial septal defect 01/25/2023   Sensorineural hearing loss, bilateral 04/19/2022   Sleep apnea    Vitamin B12 deficiency     Past Surgical History:  Procedure Laterality Date   EYE SURGERY Bilateral    right eye realignment//cataract left eye   HERNIA REPAIR     umbilical   MULTIPLE TOOTH EXTRACTIONS     REPLACEMENT TOTAL KNEE Bilateral     Current Medications: Current Meds  Medication Sig   alfuzosin (UROXATRAL) 10 MG 24 hr tablet Take 10 mg by mouth daily with breakfast.   amiodarone  (PACERONE ) 200 MG tablet Take 2 tablets (400 mg total) TWICE a day for 2 weeks, then take 1 tablet (200 mg total) TWICE a day for 2 weeks, then take 1 tablet (200 mg total) ONCE a day thereafter   bethanechol (URECHOLINE) 50 MG tablet Take 50 mg by mouth 2 (two) times daily.   Cholecalciferol (D3 2000) 50 MCG (2000 UT) CAPS Take 2,000 Units by mouth daily at 12 noon.   Coenzyme Q10 (COQ10) 100 MG CAPS Take 100 mg by mouth daily.   finasteride (PROSCAR) 5 MG tablet Take 5 mg by mouth daily.   fluticasone (FLONASE) 50  MCG/ACT nasal spray Place 1 spray into both nostrils 2 (two) times daily.   furosemide  (LASIX ) 20 MG tablet Take 1 tablet (20 mg total) by mouth daily.   methenamine (HIPREX) 1 g tablet Take 1 g by mouth every morning.   Multiple Vitamins-Minerals (VITAMIN D3 COMPLETE PO) Take 1 tablet by mouth daily.   Nintedanib (OFEV ) 100 MG CAPS Take 1 capsule (100 mg total) by mouth 2 (two) times daily.   pantoprazole (PROTONIX) 40 MG tablet Take 40 mg by mouth 2 (two) times daily.   Probiotic Product (PROBIOTIC DAILY PO) Take 1 tablet by mouth daily.   simvastatin (ZOCOR) 20 MG tablet Take 20 mg by mouth every evening.   sucralfate (CARAFATE) 1 GM/10ML suspension Take 1 g by mouth 2 (two) times daily.   telmisartan (MICARDIS) 20 MG tablet Take 20 mg by mouth daily.   Tiotropium Bromide-Olodaterol (STIOLTO RESPIMAT ) 2.5-2.5 MCG/ACT AERS Inhale 2 puffs into the lungs daily.   valACYclovir (VALTREX)  500 MG tablet Take 500 mg by mouth 2 (two) times daily as needed for other (Fever blisters). Fever blisters   [DISCONTINUED] apixaban  (ELIQUIS ) 5 MG TABS tablet TAKE 1 TABLET TWICE A DAY     Allergies:   Sulfamethoxazole, Amlodipine, Ceftriaxone, Chlorhexidine gluconate, Ciprofloxacin, Cortisone acetate [cortisone], Elemental sulfur, Flomax [tamsulosin], Hibiclens [chlorhexidine gluconate], Lisinopril, Omnipaque  [iohexol ], Other, Penicillins, Probiata [lactobacillus], Rocephin [ceftriaxone sodium in dextrose], and Sulfa antibiotics   Social History   Socioeconomic History   Marital status: Unknown    Spouse name: Not on file   Number of children: Not on file   Years of education: Not on file   Highest education level: Not on file  Occupational History   Occupation: retired  Tobacco Use   Smoking status: Former    Current packs/day: 0.00    Average packs/day: 1.5 packs/day for 25.0 years (37.5 ttl pk-yrs)    Types: Cigarettes    Start date: 08/26/1958    Quit date: 08/27/1983    Years since quitting: 41.0    Smokeless tobacco: Never  Substance and Sexual Activity   Alcohol use: No   Drug use: No   Sexual activity: Not on file  Other Topics Concern   Not on file  Social History Narrative   Not on file   Social Drivers of Health   Financial Resource Strain: Not on file  Food Insecurity: Not on file  Transportation Needs: Not on file  Physical Activity: Not on file  Stress: Not on file  Social Connections: Not on file     Family History: The patient's family history includes AAA (abdominal aortic aneurysm) in his father; Aneurysm in his mother; Breast cancer in his sister and sister; Heart attack in his father. ROS:   Please see the history of present illness.    All 14 point review of systems negative except as described per history of present illness.  EKGs/Labs/Other Studies Reviewed:    The following studies were reviewed today:   EKG:  EKG Interpretation Date/Time:  Wednesday September 04 2024 13:20:35 EDT Ventricular Rate:  80 PR Interval:  180 QRS Duration:  116 QT Interval:  406 QTC Calculation: 468 R Axis:   25  Text Interpretation: Sinus rhythm with Premature atrial complexes Incomplete right bundle branch block Borderline ECG When compared with ECG of 19-Aug-2024 15:35, Sinus rhythm has replaced Atrial fibrillation Incomplete right bundle branch block is now Present Confirmed by Liborio Hai reddy [phone]) on 09/04/2024 1:56:43 PM    Recent Labs: 07/23/2024: ALT 8 07/31/2024: NT-Pro BNP 1,712 08/27/2024: BUN 19; Creatinine, Ser 1.44; Hemoglobin 12.2; Platelets 195; Potassium 3.7; Sodium 142  Recent Lipid Panel    Component Value Date/Time   CHOL 124 01/25/2023 1142   TRIG 70 01/25/2023 1142   HDL 51 01/25/2023 1142   CHOLHDL 2.4 01/25/2023 1142   LDLCALC 59 01/25/2023 1142    Physical Exam:    VS:  BP 115/62   Pulse 80   Ht 5' 9.6 (1.768 m)   Wt 176 lb 12.8 oz (80.2 kg)   SpO2 91%   BMI 25.66 kg/m     Wt Readings from Last 3 Encounters:   09/04/24 176 lb 12.8 oz (80.2 kg)  08/19/24 176 lb (79.8 kg)  07/31/24 177 lb 6.4 oz (80.5 kg)     GENERAL:  Well nourished, well developed in no acute distress NECK: No JVD; No carotid bruits CARDIAC: Regular rate and rhythm, S1 and S2 present, no murmurs, no rubs, no gallops  CHEST: Bilateral equal air entry with diffuse crepitations Extremities: 1+ bilateral pitting pedal edema pulses bilaterally symmetric with radial 2+ and dorsalis pedis 2+ NEUROLOGIC:  Alert and oriented x 3  Medication Adjustments/Labs and Tests Ordered: Current medicines are reviewed at length with the patient today.  Concerns regarding medicines are outlined above.  Orders Placed This Encounter  Procedures   EKG 12-Lead   No orders of the defined types were placed in this encounter.   Signed, Omega Slager reddy Pluma Diniz, MD, MPH, [facility]. 09/04/2024 2:39 PM    [facility]

## 2024-09-10 ENCOUNTER — Telehealth: Payer: Self-pay | Admitting: *Deleted

## 2024-09-10 DIAGNOSIS — Z01812 Encounter for preprocedural laboratory examination: Secondary | ICD-10-CM

## 2024-09-10 DIAGNOSIS — I4819 Other persistent atrial fibrillation: Secondary | ICD-10-CM

## 2024-09-10 NOTE — Telephone Encounter (Signed)
 Pt agreeable to ablation on 11/19.  Aware office will send instructions via mychart and will call to review procedure instructions.  Patient verbalized understanding and agreeable to plan.

## 2024-09-11 ENCOUNTER — Ambulatory Visit: Admitting: Pulmonary Disease

## 2024-09-11 ENCOUNTER — Encounter: Payer: Self-pay | Admitting: Pulmonary Disease

## 2024-09-11 VITALS — BP 126/79 | HR 82 | Temp 97.9°F | Ht 67.0 in | Wt 176.0 lb

## 2024-09-11 DIAGNOSIS — Z5181 Encounter for therapeutic drug level monitoring: Secondary | ICD-10-CM

## 2024-09-11 DIAGNOSIS — J84112 Idiopathic pulmonary fibrosis: Secondary | ICD-10-CM | POA: Diagnosis not present

## 2024-09-11 DIAGNOSIS — I4891 Unspecified atrial fibrillation: Secondary | ICD-10-CM

## 2024-09-11 DIAGNOSIS — I480 Paroxysmal atrial fibrillation: Secondary | ICD-10-CM

## 2024-09-11 DIAGNOSIS — J439 Emphysema, unspecified: Secondary | ICD-10-CM | POA: Diagnosis not present

## 2024-09-11 DIAGNOSIS — R0902 Hypoxemia: Secondary | ICD-10-CM | POA: Diagnosis not present

## 2024-09-11 DIAGNOSIS — R0602 Shortness of breath: Secondary | ICD-10-CM

## 2024-09-11 LAB — CBC WITH DIFFERENTIAL/PLATELET
Basophils Absolute: 0.1 K/uL (ref 0.0–0.1)
Basophils Relative: 0.7 % (ref 0.0–3.0)
Eosinophils Absolute: 0.2 K/uL (ref 0.0–0.7)
Eosinophils Relative: 3.3 % (ref 0.0–5.0)
HCT: 38.2 % — ABNORMAL LOW (ref 39.0–52.0)
Hemoglobin: 12.6 g/dL — ABNORMAL LOW (ref 13.0–17.0)
Lymphocytes Relative: 11.7 % — ABNORMAL LOW (ref 12.0–46.0)
Lymphs Abs: 0.9 K/uL (ref 0.7–4.0)
MCHC: 32.9 g/dL (ref 30.0–36.0)
MCV: 97 fl (ref 78.0–100.0)
Monocytes Absolute: 0.7 K/uL (ref 0.1–1.0)
Monocytes Relative: 9.4 % (ref 3.0–12.0)
Neutro Abs: 5.6 K/uL (ref 1.4–7.7)
Neutrophils Relative %: 74.9 % (ref 43.0–77.0)
Platelets: 167 K/uL (ref 150.0–400.0)
RBC: 3.94 Mil/uL — ABNORMAL LOW (ref 4.22–5.81)
RDW: 14.3 % (ref 11.5–15.5)
WBC: 7.4 K/uL (ref 4.0–10.5)

## 2024-09-11 LAB — BRAIN NATRIURETIC PEPTIDE: Pro B Natriuretic peptide (BNP): 158 pg/mL — ABNORMAL HIGH (ref 0.0–100.0)

## 2024-09-11 LAB — COMPREHENSIVE METABOLIC PANEL WITH GFR
ALT: 9 U/L (ref 0–53)
AST: 12 U/L (ref 0–37)
Albumin: 4 g/dL (ref 3.5–5.2)
Alkaline Phosphatase: 40 U/L (ref 39–117)
BUN: 20 mg/dL (ref 6–23)
CO2: 24 meq/L (ref 19–32)
Calcium: 9 mg/dL (ref 8.4–10.5)
Chloride: 105 meq/L (ref 96–112)
Creatinine, Ser: 1.32 mg/dL (ref 0.40–1.50)
GFR: 49.89 mL/min — ABNORMAL LOW (ref >60.00)
Glucose, Bld: 114 mg/dL — ABNORMAL HIGH (ref 70–99)
Potassium: 3.4 meq/L — ABNORMAL LOW (ref 3.5–5.1)
Sodium: 141 meq/L (ref 135–145)
Total Bilirubin: 0.4 mg/dL (ref 0.2–1.2)
Total Protein: 6.9 g/dL (ref 6.0–8.3)

## 2024-09-11 MED ORDER — FUROSEMIDE 20 MG PO TABS: 20.0000 mg | ORAL_TABLET | Freq: Two times a day (BID) | ORAL | 3 refills | Status: DC

## 2024-09-11 MED ORDER — FUROSEMIDE 20 MG PO TABS: 20.0000 mg | ORAL_TABLET | Freq: Two times a day (BID) | ORAL | 3 refills | Status: AC

## 2024-09-11 NOTE — Patient Instructions (Signed)
  VISIT SUMMARY: Today, we discussed your worsening shortness of breath and overall decline in function. We reviewed your current medications and made adjustments to better manage your symptoms. We also planned further tests to understand the cause of your symptoms and improve your quality of life.  YOUR PLAN: IDIOPATHIC PULMONARY FIBROSIS WITH SUSPECTED PULMONARY HYPERTENSION: Your lung condition is stable on your current medication, but the worsening symptoms suggest possible pulmonary hypertension. -Continue taking nintedanib 100 mg once daily. -We will order a right heart catheterization to check for pulmonary hypertension. -A new prescription for nintedanib 100 mg once daily will be sent to [facility] pharmacy. -We will check your oxygen  levels to see if you qualify for a portable oxygen  concentrator.  DYSPNEA ON EXERTION: Your shortness of breath with minimal activity has worsened, possibly due to pulmonary hypertension or heart failure. -We will check your oxygen  levels to see if you qualify for a portable oxygen  concentrator. -Increase your diuretic dosage to 20 mg twice daily to help with potential fluid overload.  CHRONIC DIASTOLIC HEART FAILURE: You have symptoms of heart failure, including shortness of breath and leg swelling, which may be worsening due to pulmonary hypertension. -Increase your diuretic dosage to 20 mg twice daily to help with leg swelling. -We will order labs to check your kidney function and BNP levels to assess your fluid status.  ATRIAL FIBRILLATION: Your heart rhythm issue is being managed with medication, and you have an upcoming procedure scheduled. -Continue taking amiodarone  as prescribed (400 mg daily, reducing to 200 mg). -We will coordinate with your cardiologists regarding the management of your atrial fibrillation and the potential right heart catheterization. -Consider having the right heart catheterization during your scheduled ablation on  November 19th at [facility].

## 2024-09-11 NOTE — Progress Notes (Addendum)
 Vincent Wiley    [id number]    1941/09/24  Primary Care Physician:Street, Vincent HERO, MD  Referring Physician: Rusty, Vincent HERO, MD [address] [place],  [place] [address]  Chief complaint:  Follow-up for IPF. On pirfenidone  2022 Switched to nintedanib February 2023  HPI: 83 y.o.  with history of coronary artery disease, paroxysmal atrial fibrillation, hypertension, dyslipidemia. Follows with Dr. Edwyna, cardiology.  He has been referred for evaluation of interstitial lung disease, pulmonary fibrosis seen on CT scan  Complains of chronic dyspnea on exertion which is worsening over the past few years Denies any cough, sputum production, fevers, chills  Referred for pulmonary rehab at [facility] in late 2022 Started on pirfenidone  in October 2022.  This has been poorly tolerated with weight loss, loss of appetite, fatigue. Started nintedanib in February 2023.   Dose reduced to 100 mg twice daily in July 2023 due to diarrhea. Dose further reduced to 100 mg daily July 2025 due to GI symptoms  Interim history: Discussed the use of AI scribe software for clinical note transcription with the patient, who gave verbal consent to proceed.  History of Present Illness Vincent Wiley is an 83 year old male with idiopathic pulmonary fibrosis who presents with worsening shortness of breath.  Dyspnea and functional decline - Worsening dyspnea over the past 6-7 weeks, particularly with minimal exertion such as walking or bending over - Comfortable at rest - Rapid progression of symptoms after gradual onset - Associated with generalized weakness and trembling - Significant dyspnea with minimal activity, unable to use treadmill due to symptoms - Symptoms began after nintedanib dose reduction two months ago and have continued to worsen - Nocturnal or resting dyspnea not reported  Idiopathic pulmonary fibrosis management and medication side effects - Currently on  nintedanib for idiopathic pulmonary fibrosis - Nintedanib dose reduced from 150 mg twice daily to 100 mg once daily due to anorexia and bloating - Breathing issues and weakness began after dose reduction - Not on continuous oxygen , but uses supplemental oxygen  with exertion  Cardiac history and management - Atrial fibrillation managed with amiodarone  and Eliquis  - Scheduled for cardiac ablation in October 2025  Peripheral edema - Peripheral edema present - On diuretic therapy, taking 20 mg once daily  Impact on activities of daily living and quality of life - Worsening condition has significantly impacted daily activities - Expresses concern regarding progressive decline   Relevant pulmonary history 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  No feather pillows or comforters Smoking history: 26-pack-year smoker.  Quit in 1984 Travel history: No significant travel history Relevant family history: No family history of lung disease  Outpatient Encounter Medications as of 09/11/2024  Medication Sig   alfuzosin (UROXATRAL) 10 MG 24 hr tablet Take 10 mg by mouth daily with breakfast.   amiodarone  (PACERONE ) 200 MG tablet Take 2 tablets (400 mg total) TWICE a day for 2 weeks, then take 1 tablet (200 mg total) TWICE a day for 2 weeks, then take 1 tablet (200 mg total) ONCE a day thereafter   apixaban  (ELIQUIS ) 5 MG TABS tablet Take 1 tablet (5 mg total) by mouth 2 (two) times daily.   bethanechol (URECHOLINE) 50 MG tablet Take 50 mg by mouth 2 (two) times daily.   Cholecalciferol (D3 2000) 50 MCG (2000 UT) CAPS Take 2,000 Units by mouth daily at 12 noon.  Coenzyme Q10 (COQ10) 100 MG CAPS Take 100 mg by mouth daily.   finasteride (PROSCAR) 5 MG tablet Take 5 mg by mouth daily.   fluticasone (FLONASE) 50 MCG/ACT nasal spray Place 1 spray into both nostrils 2 (two) times daily.   furosemide  (LASIX )  20 MG tablet Take 1 tablet (20 mg total) by mouth daily.   methenamine (HIPREX) 1 g tablet Take 1 g by mouth every morning.   Multiple Vitamins-Minerals (VITAMIN D3 COMPLETE PO) Take 1 tablet by mouth daily.   Nintedanib (OFEV ) 100 MG CAPS Take 1 capsule (100 mg total) by mouth 2 (two) times daily.   pantoprazole (PROTONIX) 40 MG tablet Take 40 mg by mouth 2 (two) times daily.   Probiotic Product (PROBIOTIC DAILY PO) Take 1 tablet by mouth daily.   simvastatin (ZOCOR) 20 MG tablet Take 20 mg by mouth every evening.   sucralfate (CARAFATE) 1 GM/10ML suspension Take 1 g by mouth 2 (two) times daily.   telmisartan (MICARDIS) 20 MG tablet Take 20 mg by mouth daily.   Tiotropium Bromide-Olodaterol (STIOLTO RESPIMAT ) 2.5-2.5 MCG/ACT AERS Inhale 2 puffs into the lungs daily.   valACYclovir (VALTREX) 500 MG tablet Take 500 mg by mouth 2 (two) times daily as needed for other (Fever blisters). Fever blisters   No facility-administered encounter medications on file as of 09/11/2024.    Physical Exam GEN: No acute distress. CV: Regular rate and rhythm, no murmurs. LUNGS: Clear to auscultation bilaterally, normal respiratory effort. SKIN JOINTS: Warm and dry, no rash. EXTREMITIES: Edema present in feet.    Data Reviewed: Imaging: High-res CT 03/28/2021-pulmonary fibrosis in UIP pattern.  Honeycombing present, bronchial wall thickening, emphysema, coronary artery disease, aortic aneurysm, enlargement of pulmonary artery  CT chest 05/03/2021-redemonstrated pulmonary fibrosis  High-resolution CT 05/09/2022-UIP pulmonary fibrosis with progression.  High resolution CT 08/02/2023-stable pattern of UIP pulmonary fibrosis.  High-resolution CT 09/03/2024-stable pattern of UIP pulmonary fibrosis, cardiomegaly, enlargement of pulmonary artery, emphysema  I have reviewed the images personally.  Cardiac: Echocardiogram 04/03/2019- LVEF 60 to 65%, mild concentric LVH, grade 3 diastolic dysfunction, mildly elevated  PA systolic pressure, ASD  PFTs: 0/0/7977 FVC 2.70 [102%], FEV1 3.22 [126%], F/F 87, TLC 5.21 [79%], DLCO 13.51 [59%] Severe diffusion defect  07/22/2022 FVC 3.28 [94%], FEV1 2.96 [117%], F/F 88, TLC 5.29 [80%], DLCO 10.29 [45%] Severe diffusion defect  12/15/2023 FVC 3.18 [89%], FEV1 2.70 [109%], F/F85, DLCO 11.20 [50%] Moderate diffusion defect  Labs: CTD serologies 09/03/2021- negative Hepatic panel 01/17/2024-within normal limits  Cardiac Echocardiogram 04/02/2021-LVEF 60-65%, normal RV systolic size and function.  Mildly elevated PA systolic pressure.  Sleep: Overnight oximetry on CPAP and oxygen  dated 04/21/2022 Duration of study 8 hours 46 minutes Nadir O2 sat of 90%.  Oxygen  saturations remained around 96% for majority of the study  Assessment & Plan Combined pulmonary fibrosis, emphysema Idiopathic pulmonary fibrosis with suspected pulmonary hypertension CT scans show emphysema, typical UIP pattern with honeycombing.  This is likely to be idiopathic pulmonary fibrosis in the absence of any exposures or signs and symptoms of connective tissue disease.  CTD serologies are negative Discussed findings and diagnosis with patient No need for biopsy as he diagnosis of IPF is fairly certain Pirfenidone  is poorly tolerated and he is now on nintedanib  Now with worsening dyspnea and leg edema with stable CT suggests possible pulmonary hypertension, potentially causing cardiac strain. - Continue nintedanib 100 mg once daily.  - Arrange right heart catheterization to confirm pulmonary hypertension. - Send new prescription for nintedanib  100 mg once daily to [facility]. - Increase diuretic dosage to 20 mg twice daily to address potential fluid overload as he has lower extremity edema - Check CMP, hepatic panel for therapeutic monitoring  Hypoxemia Qualified for portable concentrator SATURATION QUALIFICATIONS: (This note is used to comply with regulatory  documentation for home oxygen )  Patient Saturations on Room Air at Rest = 99%  Patient Saturations on Room Air while Ambulating = 87%  Patient Saturations on 3-4  Liters of oxygen  while Ambulating = 93%  Please briefly explain why patient needs home oxygen : Pateint walked slow pace and was able to keep oxygen  saturation above 88-90%. Patient needs 3-4L Poc on ambulation.   Atrial fibrillation Atrial fibrillation is managed with amiodarone , restoring sinus rhythm as of September 10th. Cardioversion was canceled. An ablation is scheduled for November 19th. Coordination with cardiologists is needed to determine the best course of action, including potential right heart catheterization. - Continue amiodarone  as prescribed  - Coordinate with cardiologist regarding the management of atrial fibrillation and potential right heart catheterization.  Obstructive sleep apnea Compliant with CPAP and 2 L oxygen  at night.  Follows with [facility]  Plan/Recommendations: Continue Ofev  at reduced dose Increase Lasix  to 20 mg twice daily Monitor labs Arrange for right heart catheterization to evaluate pulmonary hypertension  Lonna Coder MD [facility] 09/11/2024, 2:19 PM  CC: Street, [place], *

## 2024-09-12 ENCOUNTER — Other Ambulatory Visit: Payer: Self-pay | Admitting: Pharmacist

## 2024-09-12 DIAGNOSIS — J84112 Idiopathic pulmonary fibrosis: Secondary | ICD-10-CM

## 2024-09-12 DIAGNOSIS — J849 Interstitial pulmonary disease, unspecified: Secondary | ICD-10-CM

## 2024-09-12 MED ORDER — OFEV 100 MG PO CAPS: 1.0000 | ORAL_CAPSULE | Freq: Two times a day (BID) | ORAL | 1 refills | Status: DC

## 2024-09-12 NOTE — Telephone Encounter (Signed)
 Refill sent for OFEV  to [facility] (pulmonary fibrosis team). [phone]  Dose: 100mg  twice daily  Last OV: 09/11/24 Provider: Dr. Theophilus Pertinent labs: LFTs on 09/11/2024  Next OV: 11/11/2024

## 2024-09-12 NOTE — Progress Notes (Signed)
 Refill sent for OFEV  to [facility] (pulmonary fibrosis team). [phone]  Dose: 100mg  twice daily

## 2024-09-13 ENCOUNTER — Ambulatory Visit (HOSPITAL_COMMUNITY): Admission: RE | Admit: 2024-09-13 | Source: Home / Self Care | Admitting: Cardiovascular Disease

## 2024-09-13 ENCOUNTER — Encounter (HOSPITAL_COMMUNITY): Admission: RE | Payer: Self-pay | Source: Home / Self Care

## 2024-09-13 DIAGNOSIS — I480 Paroxysmal atrial fibrillation: Secondary | ICD-10-CM

## 2024-09-13 SURGERY — CARDIOVERSION (CATH LAB)
Anesthesia: Monitor Anesthesia Care

## 2024-09-14 DIAGNOSIS — J84112 Idiopathic pulmonary fibrosis: Secondary | ICD-10-CM | POA: Diagnosis not present

## 2024-09-16 NOTE — Addendum Note (Signed)
 Addended by: GRETEL MAEOLA CROME on: 09/16/2024 12:10 PM   Modules accepted: Orders

## 2024-09-17 DIAGNOSIS — G4733 Obstructive sleep apnea (adult) (pediatric): Secondary | ICD-10-CM | POA: Diagnosis not present

## 2024-09-24 ENCOUNTER — Encounter (HOSPITAL_COMMUNITY): Payer: Self-pay

## 2024-09-24 ENCOUNTER — Other Ambulatory Visit (HOSPITAL_COMMUNITY): Payer: Self-pay

## 2024-09-24 ENCOUNTER — Telehealth (HOSPITAL_COMMUNITY): Payer: Self-pay

## 2024-09-24 DIAGNOSIS — I4819 Other persistent atrial fibrillation: Secondary | ICD-10-CM | POA: Diagnosis not present

## 2024-09-24 DIAGNOSIS — Z01812 Encounter for preprocedural laboratory examination: Secondary | ICD-10-CM | POA: Diagnosis not present

## 2024-09-24 DIAGNOSIS — J8410 Pulmonary fibrosis, unspecified: Secondary | ICD-10-CM

## 2024-09-24 NOTE — Telephone Encounter (Signed)
 Patient called and right heart scheduled for 10/16/24.patient verbalized understanding of instructions. Also have sent instructions via my chart and mail. Sent to pulmonology for Prior Auth

## 2024-09-25 DIAGNOSIS — N401 Enlarged prostate with lower urinary tract symptoms: Secondary | ICD-10-CM | POA: Diagnosis not present

## 2024-09-25 DIAGNOSIS — R339 Retention of urine, unspecified: Secondary | ICD-10-CM | POA: Diagnosis not present

## 2024-09-25 LAB — CBC
Hematocrit: 38.1 % (ref 37.5–51.0)
Hemoglobin: 12.4 g/dL — ABNORMAL LOW (ref 13.0–17.7)
MCH: 32 pg (ref 26.6–33.0)
MCHC: 32.5 g/dL (ref 31.5–35.7)
MCV: 98 fL — ABNORMAL HIGH (ref 79–97)
Platelets: 172 x10E3/uL (ref 150–450)
RBC: 3.87 x10E6/uL — ABNORMAL LOW (ref 4.14–5.80)
RDW: 12 % (ref 11.6–15.4)
WBC: 8.4 x10E3/uL (ref 3.4–10.8)

## 2024-09-25 LAB — BASIC METABOLIC PANEL WITH GFR
BUN/Creatinine Ratio: 15 (ref 10–24)
BUN: 19 mg/dL (ref 8–27)
CO2: 22 mmol/L (ref 20–29)
Calcium: 8.5 mg/dL — ABNORMAL LOW (ref 8.6–10.2)
Chloride: 106 mmol/L (ref 96–106)
Creatinine, Ser: 1.25 mg/dL (ref 0.76–1.27)
Glucose: 146 mg/dL — ABNORMAL HIGH (ref 70–99)
Potassium: 3.7 mmol/L (ref 3.5–5.2)
Sodium: 143 mmol/L (ref 134–144)
eGFR: 57 mL/min/1.73 — ABNORMAL LOW (ref >59)

## 2024-09-26 DIAGNOSIS — G4733 Obstructive sleep apnea (adult) (pediatric): Secondary | ICD-10-CM | POA: Diagnosis not present

## 2024-09-30 ENCOUNTER — Telehealth: Payer: Self-pay

## 2024-09-30 DIAGNOSIS — J84112 Idiopathic pulmonary fibrosis: Secondary | ICD-10-CM

## 2024-09-30 DIAGNOSIS — J849 Interstitial pulmonary disease, unspecified: Secondary | ICD-10-CM

## 2024-09-30 MED ORDER — OFEV 100 MG PO CAPS: 1.0000 | ORAL_CAPSULE | Freq: Two times a day (BID) | ORAL | 1 refills | Status: DC

## 2024-09-30 NOTE — Telephone Encounter (Signed)
 Copied from CRM [id number]. Topic: Clinical - Prescription Issue >> Sep 26, 2024 12:20 PM Corean SAUNDERS wrote: Reason for CRM: April from [facility] states they received an order from Dr. Theophilus for Ofev  but the prescription shows 1 capsule by mouth 2 times daily but the patient reports taking 1 capsule by mouth 1 time a day. April is requesting a new prescription if the patient is correct or a clarification call to their pharmacy is the original prescription has the correct directions.  [facility] number: [phone]  Press option 1 and then 2 to be transferred to a pharmacist for any questions or verbal orders.      Tried to reach out to  April from [facility] call, she was unavailable at the time rep tried to get in touch w/ another pharmacist no one was available at the time to take the call.    (Ofev  :  Take 1 capsule (100 mg total) by mouth 2 (two) times daily)

## 2024-10-02 DIAGNOSIS — G4733 Obstructive sleep apnea (adult) (pediatric): Secondary | ICD-10-CM | POA: Diagnosis not present

## 2024-10-02 DIAGNOSIS — D510 Vitamin B12 deficiency anemia due to intrinsic factor deficiency: Secondary | ICD-10-CM | POA: Diagnosis not present

## 2024-10-02 DIAGNOSIS — Z23 Encounter for immunization: Secondary | ICD-10-CM | POA: Diagnosis not present

## 2024-10-02 NOTE — Telephone Encounter (Unsigned)
 Copied from CRM [id number]. Topic: Clinical - Prescription Issue >> Sep 26, 2024 12:20 PM Corean SAUNDERS wrote: Reason for CRM: April from [facility] states they received an order from Dr. Theophilus for Ofev  but the prescription shows 1 capsule by mouth 2 times daily but the patient reports taking 1 capsule by mouth 1 time a day. April is requesting a new prescription if the patient is correct or a clarification call to their pharmacy is the original prescription has the correct directions.  [facility] number: [phone]  Press option 1 and then 2 to be transferred to a pharmacist for any questions or verbal orders.

## 2024-10-03 ENCOUNTER — Telehealth: Payer: Self-pay | Admitting: *Deleted

## 2024-10-03 MED ORDER — OFEV 100 MG PO CAPS: 1.0000 | ORAL_CAPSULE | Freq: Every day | ORAL | 1 refills | Status: AC

## 2024-10-03 NOTE — Telephone Encounter (Signed)
 Pt is scheduled for a cath 1022 and PVI 11/19. Pt aware he will be holding Eliquis  for cath and should restart as soon as they advise him to.  Advised to call office if he is unable to restart this 3 weeks prior to the ablation. Patient verbalized understanding and agreeable to plan.

## 2024-10-03 NOTE — Addendum Note (Signed)
 Addended by: Wiley Magan L on: 10/03/2024 12:06 PM   Modules accepted: Orders

## 2024-10-03 NOTE — Telephone Encounter (Signed)
 Per last OV note 09/11/24 with Dr. Theophilus, patient to continue taking Ofev  100mg  once daily.   Updated Rx sent to [facility].   Aleck Puls, PharmD, BCPS, CPP Clinical Pharmacist  [facility]

## 2024-10-03 NOTE — Telephone Encounter (Signed)
 Copied from CRM [id number]. Topic: Clinical - Prescription Issue >> Sep 26, 2024 12:20 PM Corean SAUNDERS wrote: Reason for CRM: April from [facility] states they received an order from Dr. Theophilus for Ofev  but the prescription shows 1 capsule by mouth 2 times daily but the patient reports taking 1 capsule by mouth 1 time a day. April is requesting a new prescription if the patient is correct or a clarification call to their pharmacy is the original prescription has the correct directions.   [facility] number: [phone]  Press option 1 and then 2 to be transferred to a pharmacist for any questions or verbal orders.    Updated Rx for Ofev  100mg  once daily sent to [facility] per last OV note with Dr. Theophilus on 09/11/24.   Aleck Puls, PharmD, BCPS, CPP Clinical Pharmacist  [facility]

## 2024-10-14 DIAGNOSIS — J8410 Pulmonary fibrosis, unspecified: Secondary | ICD-10-CM | POA: Diagnosis not present

## 2024-10-14 DIAGNOSIS — I700 Atherosclerosis of aorta: Secondary | ICD-10-CM | POA: Diagnosis not present

## 2024-10-14 DIAGNOSIS — J439 Emphysema, unspecified: Secondary | ICD-10-CM | POA: Diagnosis not present

## 2024-10-14 DIAGNOSIS — I110 Hypertensive heart disease with heart failure: Secondary | ICD-10-CM | POA: Diagnosis not present

## 2024-10-14 DIAGNOSIS — J479 Bronchiectasis, uncomplicated: Secondary | ICD-10-CM | POA: Diagnosis not present

## 2024-10-14 DIAGNOSIS — I509 Heart failure, unspecified: Secondary | ICD-10-CM | POA: Diagnosis not present

## 2024-10-14 DIAGNOSIS — I25119 Atherosclerotic heart disease of native coronary artery with unspecified angina pectoris: Secondary | ICD-10-CM | POA: Diagnosis not present

## 2024-10-14 DIAGNOSIS — J84112 Idiopathic pulmonary fibrosis: Secondary | ICD-10-CM | POA: Diagnosis not present

## 2024-10-14 DIAGNOSIS — I272 Pulmonary hypertension, unspecified: Secondary | ICD-10-CM | POA: Diagnosis not present

## 2024-10-14 DIAGNOSIS — I4891 Unspecified atrial fibrillation: Secondary | ICD-10-CM | POA: Diagnosis not present

## 2024-10-15 ENCOUNTER — Telehealth (HOSPITAL_COMMUNITY): Payer: Self-pay

## 2024-10-15 NOTE — Telephone Encounter (Signed)
 Attempted to reach patient regarding procedure scheduled for tomorrow. Left number for any questions.

## 2024-10-16 ENCOUNTER — Other Ambulatory Visit: Payer: Self-pay

## 2024-10-16 ENCOUNTER — Encounter (HOSPITAL_COMMUNITY)
Admission: RE | Disposition: A | Discharge status: Home / Self Care | Payer: Self-pay | Source: Home / Self Care | Attending: Cardiology

## 2024-10-16 ENCOUNTER — Ambulatory Visit (HOSPITAL_COMMUNITY)
Admission: RE | Admit: 2024-10-16 | Discharge: 2024-10-16 | Disposition: A | Discharge status: Home / Self Care | Attending: Cardiology | Admitting: Cardiology

## 2024-10-16 DIAGNOSIS — I272 Pulmonary hypertension, unspecified: Secondary | ICD-10-CM | POA: Diagnosis not present

## 2024-10-16 DIAGNOSIS — J84112 Idiopathic pulmonary fibrosis: Secondary | ICD-10-CM | POA: Insufficient documentation

## 2024-10-16 DIAGNOSIS — Z87891 Personal history of nicotine dependence: Secondary | ICD-10-CM | POA: Diagnosis not present

## 2024-10-16 DIAGNOSIS — J8410 Pulmonary fibrosis, unspecified: Secondary | ICD-10-CM

## 2024-10-16 HISTORY — PX: RIGHT HEART CATH: CATH118263

## 2024-10-16 LAB — POCT I-STAT EG7
Acid-base deficit: 1 mmol/L (ref 0.0–2.0)
Acid-base deficit: 1 mmol/L (ref 0.0–2.0)
Acid-base deficit: 2 mmol/L (ref 0.0–2.0)
Bicarbonate: 23.3 mmol/L (ref 20.0–28.0)
Bicarbonate: 22.8 mmol/L (ref 20.0–28.0)
Bicarbonate: 23.5 mmol/L (ref 20.0–28.0)
Calcium, Ion: 1.22 mmol/L (ref 1.15–1.40)
Calcium, Ion: 1.15 mmol/L (ref 1.15–1.40)
Calcium, Ion: 1.2 mmol/L (ref 1.15–1.40)
HCT: 34 % — ABNORMAL LOW (ref 39.0–52.0)
HCT: 34 % — ABNORMAL LOW (ref 39.0–52.0)
HCT: 34 % — ABNORMAL LOW (ref 39.0–52.0)
Hemoglobin: 11.6 g/dL — ABNORMAL LOW (ref 13.0–17.0)
Hemoglobin: 11.6 g/dL — ABNORMAL LOW (ref 13.0–17.0)
Hemoglobin: 11.6 g/dL — ABNORMAL LOW (ref 13.0–17.0)
O2 Saturation: 70 %
O2 Saturation: 66 %
O2 Saturation: 66 %
Potassium: 3.7 mmol/L (ref 3.5–5.1)
Potassium: 3.7 mmol/L (ref 3.5–5.1)
Potassium: 3.8 mmol/L (ref 3.5–5.1)
Sodium: 144 mmol/L (ref 135–145)
Sodium: 144 mmol/L (ref 135–145)
Sodium: 145 mmol/L (ref 135–145)
TCO2: 24 mmol/L (ref 22–32)
TCO2: 24 mmol/L (ref 22–32)
TCO2: 25 mmol/L (ref 22–32)
pCO2, Ven: 37.9 mmHg — ABNORMAL LOW (ref 44–60)
pCO2, Ven: 37.2 mmHg — ABNORMAL LOW (ref 44–60)
pCO2, Ven: 38.2 mmHg — ABNORMAL LOW (ref 44–60)
pH, Ven: 7.397 (ref 7.25–7.43)
pH, Ven: 7.396 (ref 7.25–7.43)
pH, Ven: 7.397 (ref 7.25–7.43)
pO2, Ven: 36 mmHg (ref 32–45)
pO2, Ven: 34 mmHg (ref 32–45)
pO2, Ven: 34 mmHg (ref 32–45)

## 2024-10-16 SURGERY — RIGHT HEART CATH
Anesthesia: LOCAL

## 2024-10-16 MED ORDER — HEPARIN (PORCINE) IN NACL 1000-0.9 UT/500ML-% IV SOLN
INTRAVENOUS | Status: DC | PRN
  Administered 2024-10-16: 500 mL

## 2024-10-16 MED ORDER — SODIUM CHLORIDE 0.9 % IV SOLN: 250.0000 mL | INTRAVENOUS | Status: DC | PRN

## 2024-10-16 MED ORDER — FENTANYL CITRATE (PF) 100 MCG/2ML IJ SOLN
INTRAMUSCULAR | Status: AC
  Filled 2024-10-16: qty 2

## 2024-10-16 MED ORDER — LIDOCAINE HCL (PF) 1 % IJ SOLN
INTRAMUSCULAR | Status: AC
  Filled 2024-10-16: qty 30

## 2024-10-16 MED ORDER — FREE WATER: 500.0000 mL | Freq: Once | Status: DC

## 2024-10-16 MED ORDER — SODIUM CHLORIDE 0.9% FLUSH: 3.0000 mL | Freq: Two times a day (BID) | INTRAVENOUS | Status: DC

## 2024-10-16 MED ORDER — DIPHENHYDRAMINE HCL 50 MG/ML IJ SOLN
INTRAMUSCULAR | Status: AC
  Filled 2024-10-16: qty 1

## 2024-10-16 MED ORDER — LIDOCAINE HCL (PF) 1 % IJ SOLN
INTRAMUSCULAR | Status: DC | PRN
  Administered 2024-10-16: 5 mL via INTRADERMAL
  Administered 2024-10-16: 2 mL via INTRADERMAL

## 2024-10-16 MED ORDER — LABETALOL HCL 5 MG/ML IV SOLN: 10.0000 mg | INTRAVENOUS | Status: DC | PRN

## 2024-10-16 MED ORDER — FREE WATER: 250.0000 mL | Freq: Once | Status: DC

## 2024-10-16 MED ORDER — HYDRALAZINE HCL 20 MG/ML IJ SOLN: 10.0000 mg | INTRAMUSCULAR | Status: DC | PRN

## 2024-10-16 MED ORDER — ONDANSETRON HCL 4 MG/2ML IJ SOLN: 4.0000 mg | Freq: Four times a day (QID) | INTRAMUSCULAR | Status: DC | PRN

## 2024-10-16 MED ORDER — MIDAZOLAM HCL (PF) 2 MG/2ML IJ SOLN
INTRAMUSCULAR | Status: DC | PRN
  Administered 2024-10-16: .5 mg via INTRAVENOUS

## 2024-10-16 MED ORDER — SODIUM CHLORIDE 0.9% FLUSH: 3.0000 mL | INTRAVENOUS | Status: DC | PRN

## 2024-10-16 MED ORDER — FENTANYL CITRATE (PF) 100 MCG/2ML IJ SOLN
INTRAMUSCULAR | Status: DC | PRN
  Administered 2024-10-16: 25 ug via INTRAVENOUS

## 2024-10-16 MED ORDER — ACETAMINOPHEN 325 MG PO TABS: 650.0000 mg | ORAL_TABLET | ORAL | Status: DC | PRN

## 2024-10-16 MED ORDER — DIPHENHYDRAMINE HCL 50 MG/ML IJ SOLN
INTRAMUSCULAR | Status: DC | PRN
  Administered 2024-10-16: 25 mg via INTRAVENOUS

## 2024-10-16 MED ORDER — MIDAZOLAM HCL 2 MG/2ML IJ SOLN
INTRAMUSCULAR | Status: AC
  Filled 2024-10-16: qty 2

## 2024-10-16 SURGICAL SUPPLY — 11 items
CATH BALLN WEDGE 5F 110CM (CATHETERS) IMPLANT
GUIDEWIRE .025 260CM (WIRE) IMPLANT
KIT MICROPUNCTURE NIT STIFF (SHEATH) IMPLANT
PACK CARDIAC CATHETERIZATION (CUSTOM PROCEDURE TRAY) ×1 IMPLANT
SHEATH GLIDE SLENDER 4/5FR (SHEATH) IMPLANT
SHEATH PINNACLE 5F 10CM (SHEATH) IMPLANT
SHEATH PROBE COVER 6X72 (BAG) IMPLANT
TRANSDUCER W/STOPCOCK (MISCELLANEOUS) IMPLANT
TUBING ART PRESS 72 MALE/FEM (TUBING) IMPLANT
WIRE EMERALD 3MM-J .025X260CM (WIRE) IMPLANT
WIRE MICROINTRODUCER 60CM (WIRE) IMPLANT

## 2024-10-16 NOTE — Progress Notes (Signed)
 Patient assisted getting dressed for discharge. Right groin and right brachial sites clean dry and intact. No bleeding or hematoma noted. Patient discharged home.

## 2024-10-16 NOTE — Discharge Instructions (Signed)
 Brachial Site Care   This sheet gives you information about how to care for yourself after your procedure. Your health care provider may also give you more specific instructions. If you have problems or questions, contact your health care provider. What can I expect after the procedure? After the procedure, it is common to have: Bruising and tenderness at the catheter insertion area. Follow these instructions at home:  Insertion site care Follow instructions from your health care provider about how to take care of your insertion site. Make sure you: Wash your hands with soap and water before you change your bandage (dressing). If soap and water are not available, use hand sanitizer. Remove your dressing as told by your health care provider. In 24 hours Check your insertion site every day for signs of infection. Check for: Redness, swelling, or pain. Pus or a bad smell. Warmth. You may shower 24-48 hours after the procedure. Do not apply powder or lotion to the site.  Activity For 24 hours after the procedure, or as directed by your health care provider: Do not push or pull heavy objects with the affected arm. Do not drive yourself home from the hospital or clinic. You may drive 24 hours after the procedure unless your health care provider tells you not to. Do not lift anything that is heavier than 10 lb (4.5 kg), or the limit that you are told, until your health care provider says that it is safe.  For 24 hours Femoral Site Care This sheet gives you information about how to care for yourself after your procedure. Your health care provider may also give you more specific instructions. If you have problems or questions, contact your health care provider. What can I expect after the procedure?  After the procedure, it is common to have: Bruising that usually fades within 1-2 weeks. Tenderness at the site. Follow these instructions at home: Wound care Follow instructions from your  health care provider about how to take care of your insertion site. Make sure you: Wash your hands with soap and water before you change your bandage (dressing). If soap and water are not available, use hand sanitizer. Remove your dressing as told by your health care provider. 24 hours Do not take baths, swim, or use a hot tub until your health care provider approves. You may shower 24-48 hours after the procedure or as told by your health care provider. Gently wash the site with plain soap and water. Pat the area dry with a clean towel. Do not rub the site. This may cause bleeding. Do not apply powder or lotion to the site. Keep the site clean and dry. Check your femoral site every day for signs of infection. Check for: Redness, swelling, or pain. Fluid or blood. Warmth. Pus or a bad smell. Activity For the first 2-3 days after your procedure, or as long as directed: Avoid climbing stairs as much as possible. Do not squat. Do not lift anything that is heavier than 10 lb (4.5 kg), or the limit that you are told, until your health care provider says that it is safe. For 5 days Rest as directed. Avoid sitting for a long time without moving. Get up to take short walks every 1-2 hours. Do not drive for 24 hours if you were given a medicine to help you relax (sedative). General instructions Take over-the-counter and prescription medicines only as told by your health care provider. Keep all follow-up visits as told by your health care provider. This  is important. Contact a health care provider if you have: A fever or chills. You have redness, swelling, or pain around your insertion site. Get help right away if: The catheter insertion area swells very fast. You pass out. You suddenly start to sweat or your skin gets clammy. The catheter insertion area is bleeding, and the bleeding does not stop when you hold steady pressure on the area. The area near or just beyond the catheter insertion  site becomes pale, cool, tingly, or numb. These symptoms may represent a serious problem that is an emergency. Do not wait to see if the symptoms will go away. Get medical help right away. Call your local emergency services (911 in the [place] Do not drive yourself to the hospital. Summary After the procedure, it is common to have bruising that usually fades within 1-2 weeks. Check your femoral site every day for signs of infection. Do not lift anything that is heavier than 10 lb (4.5 kg), or the limit that you are told, until your health care provider says that it is safe. This information is not intended to replace advice given to you by your health care provider. Make sure you discuss any questions you have with your health care provider. Document Revised: 12/25/2017 Document Reviewed: 12/25/2017 Elsevier Patient Education  2020 [employer].

## 2024-10-16 NOTE — H&P (Signed)
 Advanced Heart Failure Team History and Physical Note   PCP:  Street, Lonni HERO, MD  PCP-Cardiology: None     Reason for Admission: RHC   HPI:    Patient has history of IPF, presents for RHC to assess for pulmonary hypertension.    Home Medications Prior to Admission medications   Medication Sig Start Date End Date Taking? Authorizing Provider  alfuzosin (UROXATRAL) 10 MG 24 hr tablet Take 10 mg by mouth daily with breakfast.   Yes [provider]  amiodarone  (PACERONE ) 200 MG tablet Take 2 tablets (400 mg total) TWICE a day for 2 weeks, then take 1 tablet (200 mg total) TWICE a day for 2 weeks, then take 1 tablet (200 mg total) ONCE a day thereafter Patient taking differently: Take 200 mg by mouth daily. 08/19/24  Yes Camnitz, Soyla Lunger, MD  apixaban  (ELIQUIS ) 5 MG TABS tablet Take 1 tablet (5 mg total) by mouth 2 (two) times daily. 09/04/24  Yes Madireddy, Alean SAUNDERS, MD  bethanechol (URECHOLINE) 50 MG tablet Take 50 mg by mouth 2 (two) times daily. 04/07/22  Yes [provider]  Carboxymethylcellulose Sodium (REFRESH LIQUIGEL) 1 % GEL Place 1 drop into the left eye 2 (two) times daily. Additional during the day of needed for dry eye   Yes [provider]  Cholecalciferol (D3 2000) 50 MCG (2000 UT) CAPS Take 2,000 Units by mouth daily.   Yes [provider]  Coenzyme Q10 (COQ10) 100 MG CAPS Take 100 mg by mouth daily.   Yes [provider]  finasteride (PROSCAR) 5 MG tablet Take 5 mg by mouth daily. 11/29/22  Yes [provider]  fluticasone (FLONASE) 50 MCG/ACT nasal spray Place 1 spray into both nostrils 2 (two) times daily.   Yes [provider]  furosemide  (LASIX ) 20 MG tablet Take 1 tablet (20 mg total) by mouth 2 (two) times daily. 09/11/24 12/10/24 Yes Mannam, Praveen, MD  methenamine (HIPREX) 1 g tablet Take 1 g by mouth every morning. 08/04/23  Yes [provider]  Nintedanib (OFEV ) 100 MG CAPS Take 1  capsule (100 mg total) by mouth daily. 10/03/24  Yes Mannam, Praveen, MD  pantoprazole (PROTONIX) 40 MG tablet Take 40 mg by mouth 2 (two) times daily.   Yes [provider]  prednisoLONE acetate (PRED FORTE) 1 % ophthalmic suspension Place 1 drop into the right eye 3 (three) times a week. At bedtime   Yes [provider]  Probiotic Product (PROBIOTIC DAILY PO) Take 1 tablet by mouth daily.   Yes [provider]  simvastatin (ZOCOR) 20 MG tablet Take 20 mg by mouth every evening.   Yes [provider]  telmisartan (MICARDIS) 20 MG tablet Take 10 mg by mouth daily. 08/29/24  Yes [provider]  Tiotropium Bromide-Olodaterol (STIOLTO RESPIMAT ) 2.5-2.5 MCG/ACT AERS Inhale 2 puffs into the lungs daily. 09/21/23  Yes Parrett, Tammy S, NP  valACYclovir (VALTREX) 500 MG tablet Take 500 mg by mouth 2 (two) times daily as needed for other (Fever blisters). Fever blisters    [provider]    Past Medical History: 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    Chronic GI losses, B12 Deficiency   Ascending aortic aneurysm 06/08/2021   Benign essential hypertension    Benign prostatic hyperplasia 01/12/2022   Benign prostatic hyperplasia with lower urinary tract symptoms 01/19/2023   BPH with  obstruction/lower urinary tract symptoms    Cardiac murmur 03/31/2021   Chronic respiratory failure with hypoxia (HCC) 09/21/2023   Coronary atherosclerosis 06/08/2021   Diastolic heart failure (HCC) 09/21/2023   DOE (dyspnea on exertion)    Emphysema lung (HCC) 09/21/2023   Encounter for fitting and adjustment of hearing aid 04/19/2022   Encounter for immunization 04/19/2022   Essential (primary) hypertension 01/12/2022   Gastroesophageal reflux disease 01/12/2022   Hyperlipidemia    Idiopathic pulmonary fibrosis (HCC) 01/12/2022   Impacted cerumen, bilateral 04/19/2022   Kidney  disease    Obstructive sleep apnea syndrome 04/30/2013   NPSG 01/2012:  AHI 27/hr with desat to 86%.   Other retention of urine 01/19/2023   Paroxysmal atrial fibrillation (HCC) 03/31/2021   Pernicious anemia    Pulmonary fibrosis (HCC) 03/31/2021   Rash 09/21/2023   Seasonal allergies    Secundum atrial septal defect 01/25/2023   Sensorineural hearing loss, bilateral 04/19/2022   Sleep apnea    Vitamin B12 deficiency     Past Surgical History: Past Surgical History:  Procedure Laterality Date   EYE SURGERY Bilateral    right eye realignment//cataract left eye   HERNIA REPAIR     umbilical   MULTIPLE TOOTH EXTRACTIONS     REPLACEMENT TOTAL KNEE Bilateral     Family History:  Family History  Problem Relation Age of Onset   Breast cancer Sister    Breast cancer Sister    Heart attack Father        deceased from MI   AAA (abdominal aortic aneurysm) Father    Aneurysm Mother        deceased from rupture    Social History: Social History   Socioeconomic History   Marital status: Unknown    Spouse name: Not on file   Number of children: Not on file   Years of education: Not on file   Highest education level: Not on file  Occupational History   Occupation: retired  Tobacco Use   Smoking status: Former    Current packs/day: 0.00    Average packs/day: 1.5 packs/day for 25.0 years (37.5 ttl pk-yrs)    Types: Cigarettes    Start date: 08/26/1958    Quit date: 08/27/1983    Years since quitting: 41.1   Smokeless tobacco: Never  Substance and Sexual Activity   Alcohol use: No   Drug use: No   Sexual activity: Not on file  Other Topics Concern   Not on file  Social History Narrative   Not on file   Social Drivers of Health   Financial Resource Strain: Not on file  Food Insecurity: Not on file  Transportation Needs: Not on file  Physical Activity: Not on file  Stress: Not on file  Social Connections: Not on file    Allergies:  Allergies  Allergen Reactions    Sulfamethoxazole Other (See Comments)    Severe reaction   Amlodipine     Tingling hands   Ceftriaxone     Other reaction(s): Unknown   Chlorhexidine Gluconate    Ciprofloxacin Itching    Edema of ear   Cortisone Acetate [Cortisone] Hives    Hive reaction to injectable Kenalog  and topical reaction to Cortisporin   Elemental Sulfur    Flomax [Tamsulosin] Other (See Comments)    Dizziness, blurred vision and Headache   Hibiclens [Chlorhexidine Gluconate]    Lisinopril    Omnipaque  [Iohexol ] Hives   Other     Steroid Injections   Penicillins  Probiata [Lactobacillus]    Rocephin [Ceftriaxone Sodium In Dextrose]    Sulfa Antibiotics     Other reaction(s): Unknown    Objective:    Vital Signs:   Temp:  [97.7 F (36.5 C)] 97.7 F (36.5 C) (10/22 0700) Pulse Rate:  [69] 69 (10/22 0700) BP: (119)/(67) 119/67 (10/22 0700) SpO2:  [93 %-96 %] 96 % (10/22 1044) Weight:  [78.9 kg] 78.9 kg (10/22 0700)   Filed Weights   10/16/24 0700  Weight: 78.9 kg     Physical Exam     General:  Well appearing. No respiratory difficulty HEENT: Normal Neck: Supple. no JVD. Carotids 2+ bilat; no bruits. No lymphadenopathy or thyromegaly appreciated. Cor: PMI nondisplaced. Regular rate & rhythm. No rubs, gallops or murmurs. Lungs: dry crackles bases Abdomen: Soft, nontender, nondistended. No hepatosplenomegaly. No bruits or masses. Good bowel sounds. Extremities: No cyanosis, clubbing, rash, edema Neuro: Alert & oriented x 3, cranial nerves grossly intact. moves all 4 extremities w/o difficulty. Affect pleasant.   Labs     Basic Metabolic Panel: No results for input(s): NA, K, CL, CO2, GLUCOSE, BUN, CREATININE, CALCIUM, MG, PHOS in the last 168 hours.  Liver Function Tests: No results for input(s): AST, ALT, ALKPHOS, BILITOT, PROT, ALBUMIN in the last 168 hours. No results for input(s): LIPASE, AMYLASE in the last 168 hours. No results for  input(s): AMMONIA in the last 168 hours.  CBC: No results for input(s): WBC, NEUTROABS, HGB, HCT, MCV, PLT in the last 168 hours.  Cardiac Enzymes: No results for input(s): CKTOTAL, CKMB, CKMBINDEX, TROPONINI in the last 168 hours.  BNP: BNP (last 3 results) No results for input(s): BNP in the last 8760 hours.  ProBNP (last 3 results) Recent Labs    07/23/24 1156 07/31/24 1400 09/11/24 1505  PROBNP 2,433* 1,712* 158.0*     CBG: No results for input(s): GLUCAP in the last 168 hours.  Coagulation Studies: No results for input(s): LABPROT, INR in the last 72 hours.  Imaging: No results found.   Assessment/Plan   RHC today to assess for pulmonary hypertension   Ezra Shuck, MD 10/16/2024,  Pager [phone] (M-F; 7a - 5p)  Please contact [facility] for night-coverage after hours (4p -7a ) and weekends on [email or web address]

## 2024-10-16 NOTE — Progress Notes (Signed)
 Patient ambulated in the hall. Right groin site and right brachial site dressings clean, dry, and intact. No hematoma or bleeding noted.

## 2024-10-17 ENCOUNTER — Encounter (HOSPITAL_COMMUNITY): Payer: Self-pay | Admitting: Cardiology

## 2024-10-21 ENCOUNTER — Other Ambulatory Visit: Payer: Self-pay

## 2024-10-21 ENCOUNTER — Telehealth: Payer: Self-pay

## 2024-10-21 DIAGNOSIS — I4819 Other persistent atrial fibrillation: Secondary | ICD-10-CM

## 2024-10-21 NOTE — Telephone Encounter (Signed)
-----   Message from Nurse Sherri P sent at 09/16/2024 12:11 PM EDT ----- Regarding: 11/13/24  AFib ablation Precert:  MD: Camnitz Type of ablation: A-fib Diagnosis: afib CPT code: A-fib (06343) Ablation scheduled (date/time): 11/19 11:00 am  Procedure:  Added to calendar? Yes Orders entered? Yes Letter complete? No, >30 days before procedure Scheduled with cath lab? Yes Any medications to hold? No Labs ordered (CBC, BMET, PT/INR if on warfarin): Yes Mapping system: Doesn't matter [employer] rep notified? No Cardiac CT needed? No Dye allergy? N/a Pre-meds ordered and instructions given? N/a Letter method: Patient pick-up H&P: 8/25 Device: No  Follow-up:  Cassie/Angel, please schedule Routine.

## 2024-10-21 NOTE — Telephone Encounter (Signed)
 Called pt to inform him that he could go to [facility] office to have lab work completed. He will pick up Instruction letter at that time.   He is planning on going tomorrow 10/28 for labs.

## 2024-10-22 DIAGNOSIS — R21 Rash and other nonspecific skin eruption: Secondary | ICD-10-CM | POA: Diagnosis not present

## 2024-10-22 DIAGNOSIS — G609 Hereditary and idiopathic neuropathy, unspecified: Secondary | ICD-10-CM | POA: Diagnosis not present

## 2024-10-22 DIAGNOSIS — Z6825 Body mass index (BMI) 25.0-25.9, adult: Secondary | ICD-10-CM | POA: Diagnosis not present

## 2024-10-22 DIAGNOSIS — I480 Paroxysmal atrial fibrillation: Secondary | ICD-10-CM | POA: Diagnosis not present

## 2024-10-22 DIAGNOSIS — I4819 Other persistent atrial fibrillation: Secondary | ICD-10-CM | POA: Diagnosis not present

## 2024-10-22 DIAGNOSIS — E538 Deficiency of other specified B group vitamins: Secondary | ICD-10-CM | POA: Diagnosis not present

## 2024-10-22 LAB — CBC
Hematocrit: 38.7 % (ref 37.5–51.0)
Hemoglobin: 12.4 g/dL — ABNORMAL LOW (ref 13.0–17.7)
MCH: 31.6 pg (ref 26.6–33.0)
MCHC: 32 g/dL (ref 31.5–35.7)
MCV: 99 fL — ABNORMAL HIGH (ref 79–97)
Platelets: 179 x10E3/uL (ref 150–450)
RBC: 3.93 x10E6/uL — ABNORMAL LOW (ref 4.14–5.80)
RDW: 12.7 % (ref 11.6–15.4)
WBC: 9.8 x10E3/uL (ref 3.4–10.8)

## 2024-10-22 LAB — BASIC METABOLIC PANEL WITH GFR
BUN/Creatinine Ratio: 15 (ref 10–24)
BUN: 21 mg/dL (ref 8–27)
CO2: 22 mmol/L (ref 20–29)
Calcium: 9 mg/dL (ref 8.6–10.2)
Chloride: 105 mmol/L (ref 96–106)
Creatinine, Ser: 1.41 mg/dL — ABNORMAL HIGH (ref 0.76–1.27)
Glucose: 109 mg/dL — ABNORMAL HIGH (ref 70–99)
Potassium: 4.3 mmol/L (ref 3.5–5.2)
Sodium: 143 mmol/L (ref 134–144)
eGFR: 49 mL/min/1.73 — ABNORMAL LOW (ref >59)

## 2024-10-24 ENCOUNTER — Encounter (HOSPITAL_COMMUNITY): Payer: Self-pay

## 2024-10-24 ENCOUNTER — Telehealth (HOSPITAL_COMMUNITY): Payer: Self-pay

## 2024-10-24 NOTE — Telephone Encounter (Signed)
 Spoke with patient to complete pre-procedure call.     Health status review:  Any new medical conditions, recent signs of acute illness or been started on antibiotics? No Any recent hospitalizations or surgeries? RHC on 10/22- no pulmonary hypertension Any new medications started since pre-op visit? No  Follow all medication instructions prior to procedure or the procedure may be rescheduled:    Continue taking Eliquis  (Apixaban ) twice daily without missing any doses before procedure. Essential chronic medications:  No medication should be continued, unless told otherwise. On the morning of your procedure DO NOT take any medication., including Eliquis  (Apixaban ).  Nothing to eat or drink after midnight prior to your procedure.  Pre-procedure testing scheduled: CT not needed, lab work completed.  Confirmed patient is scheduled for Atrial Fibrillation Ablation on Wednesday, November 19 with Dr. Dr. Inocencio. Instructed patient to arrive at the Main Entrance A at [facility]: [address] [place], [place] [address] and check in at Admitting at 9:00 AM.  Plan to go home the same day, you will only stay overnight if medically necessary.. You MUST have a responsible adult to drive you home and MUST be with you the first 24 hours after you arrive home or your procedure could be cancelled.  Informed a nurse may call a day before the procedure to confirm arrival time and ensure instructions are followed.  Patient verbalized understanding to information provided and is agreeable to proceed with procedure.   Advised to contact RN Navigator at [phone], to inform of any new medications started after call or concerns prior to procedure.

## 2024-10-26 DIAGNOSIS — N401 Enlarged prostate with lower urinary tract symptoms: Secondary | ICD-10-CM | POA: Diagnosis not present

## 2024-10-26 DIAGNOSIS — R339 Retention of urine, unspecified: Secondary | ICD-10-CM | POA: Diagnosis not present

## 2024-10-28 ENCOUNTER — Other Ambulatory Visit: Payer: Self-pay | Admitting: Adult Health

## 2024-11-04 ENCOUNTER — Encounter: Payer: Self-pay | Admitting: Emergency Medicine

## 2024-11-04 DIAGNOSIS — E538 Deficiency of other specified B group vitamins: Secondary | ICD-10-CM | POA: Diagnosis not present

## 2024-11-11 ENCOUNTER — Ambulatory Visit (INDEPENDENT_AMBULATORY_CARE_PROVIDER_SITE_OTHER): Admitting: Adult Health

## 2024-11-11 ENCOUNTER — Encounter: Payer: Self-pay | Admitting: Adult Health

## 2024-11-11 VITALS — BP 130/72 | HR 73 | Temp 97.6°F | Ht 67.0 in | Wt 171.6 lb

## 2024-11-11 DIAGNOSIS — Z87891 Personal history of nicotine dependence: Secondary | ICD-10-CM

## 2024-11-11 DIAGNOSIS — I480 Paroxysmal atrial fibrillation: Secondary | ICD-10-CM

## 2024-11-11 DIAGNOSIS — J84112 Idiopathic pulmonary fibrosis: Secondary | ICD-10-CM | POA: Diagnosis not present

## 2024-11-11 DIAGNOSIS — I509 Heart failure, unspecified: Secondary | ICD-10-CM | POA: Diagnosis not present

## 2024-11-11 DIAGNOSIS — Z9989 Dependence on other enabling machines and devices: Secondary | ICD-10-CM

## 2024-11-11 DIAGNOSIS — Z7901 Long term (current) use of anticoagulants: Secondary | ICD-10-CM

## 2024-11-11 DIAGNOSIS — J439 Emphysema, unspecified: Secondary | ICD-10-CM | POA: Diagnosis not present

## 2024-11-11 DIAGNOSIS — J9611 Chronic respiratory failure with hypoxia: Secondary | ICD-10-CM

## 2024-11-11 DIAGNOSIS — G4733 Obstructive sleep apnea (adult) (pediatric): Secondary | ICD-10-CM | POA: Diagnosis not present

## 2024-11-11 DIAGNOSIS — I4891 Unspecified atrial fibrillation: Secondary | ICD-10-CM

## 2024-11-11 DIAGNOSIS — I5032 Chronic diastolic (congestive) heart failure: Secondary | ICD-10-CM

## 2024-11-11 NOTE — Progress Notes (Signed)
 @Patient  ID: Vincent Wiley, male    DOB: 1941/07/12, 83 y.o.   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  No feather pillows or comforters Smoking history: 26-pack-year smoker.  Quit in 1984 Travel history: No significant travel history Relevant family history: No family history of lung disease   SH: Lives at home with wife, drives , light activities, O2 At bedtime . Retired -   High-res CT 03/28/2021-pulmonary fibrosis in UIP pattern.  Honeycombing present, bronchial wall thickening, emphysema, coronary artery disease, aortic aneurysm, enlargement of pulmonary artery  CT chest 05/03/2021-redemonstrated pulmonary fibrosis   High-resolution CT 05/09/2022-UIP pulmonary fibrosis with progression.   High resolution CT 08/02/2023-stable pattern of UIP pulmonary fibrosis.  HRCT chest September 03, 2024 showed no interval change and moderate to severe pulmonary fibrosis since last imaging.  Although there is been clear progression over time, moderate emphysema with bullae in the lung bases, moderate hiatal hernia       Cardiac: Echocardiogram 04/03/2019- LVEF 60 to 65%, mild concentric LVH, grade 3 diastolic dysfunction, mildly elevated PA systolic pressure, ASD-secundum type    PFTs: 09/03/2021 FVC 2.70 [102%], FEV1 3.22 [126%], F/F 87, TLC 5.21 [79%], DLCO 13.51 [59%] Severe diffusion defect   07/22/2022 FVC 3.28 [94%], FEV1 2.96 [117%], F/F 88, TLC 5.29  [80%], DLCO 10.29 [45%] Severe diffusion defect  Pulmonary function tests done on December 15, 2023 showed stable results with a slight improvement in diffusing capacity FVC 89%, FEV1 109%, ratio 85, DLCO is at 50%.  Previously was at 45%.   Labs: CTD serologies 09/03/2021- negative   Hepatic panel from primary care dated 07/06/2022 Within normal limits except for low albumin of 3.3   Received hepatic panel from primary care dated 11/09/2022 Alk phos is borderline low at 37, rest of hepatic panel is within normal limits.   CMP 01/25/23-significant only for alk phos of 43   Cardiac Echocardiogram 04/02/2021-LVEF 60-65%, normal RV systolic size and function.  Mildly elevated PA systolic pressure.   Sleep: Overnight oximetry on CPAP and oxygen  dated 04/21/2022 Duration of study 8 hours 46 minutes Nadir O2 sat of 90%. Oxygen  saturations remained around 96% for majority of the study Discussed the use of AI scribe software for clinical note transcription with the patient, who gave verbal consent to proceed.  History of Present Illness Vincent Wiley is an 83 year old male with emphysema and pulmonary fibrosis who presents for 4 month follow up.   Overall feels breathing is at baseline, He experiences increased shortness of breath when in a hurry or bending over, particularly during outdoor activities. He remains comfortable when sitting or walking on a level surface. He attempts to stay active by moving around but does not engage in regular walking or exercise.  PFTdone Dec 2024 was stable. HRCT chest in September 2025 indicated stable moderate to severe pulmonary fibrosis.  He uses a Stiolto inhaler, taking two puffs once a day, and is on Ofev  once a day due to  intolerance of twice-daily dosing. He also uses oxygen  at bedtime and occasionally during the day when needed, and a CPAP machine at night with O2.   He recently underwent a right heart catheterization last month. It showed normal PA  pressure, no pulmonary hypertension.  Followed by Cardiology for A Fib, on Eliquis  and amiodarone  . He has planned Ablation later this week. Last visit lasix  was increased to Twice daily  but was unable to tolerate, made him too weak.   Weight has been stable. Appetite is fair.  He received a flu shot and pneumonia vaccine last year, and his TDAP is up to date.   No diarrhea or vomiting.      Allergies  Allergen Reactions   Sulfamethoxazole Other (See Comments)    Severe reaction   Amlodipine     Tingling hands   Ceftriaxone     Other reaction(s): Unknown   Chlorhexidine Gluconate    Ciprofloxacin Itching    Edema of ear   Cortisone Acetate [Cortisone] Hives    Hive reaction to injectable Kenalog  and topical reaction to Cortisporin   Elemental Sulfur    Flomax [Tamsulosin] Other (See Comments)    Dizziness, blurred vision and Headache   Hibiclens [Chlorhexidine Gluconate]    Lisinopril    Omnipaque  [Iohexol ] Hives   Other     Steroid Injections   Penicillins    Probiata [Lactobacillus]    Rocephin [Ceftriaxone Sodium In Dextrose]    Sulfa Antibiotics     Other reaction(s): Unknown    Immunization History  Administered Date(s) Administered   Fluad Quad(high Dose 65+) 09/25/2021   Influenza Split 09/25/2012   Influenza-Unspecified 09/25/2002, 10/26/2004, 10/29/2021, 09/26/2022   Moderna Sars-Covid-2 Vaccination 03/31/2020, 04/28/2020, 11/06/2020   PNEUMOCOCCAL CONJUGATE-20 01/02/2023   Pneumococcal Conjugate-13 04/10/2014   Pneumococcal Polysaccharide-23 01/16/2012   Pneumococcal-Unspecified 05/15/2006   Td 03/28/1990   Tdap 01/16/2012, 12/31/2021   Zoster Recombinant(Shingrix) 12/31/2021, 04/06/2022    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    Chronic GI losses, B12 Deficiency   Ascending aortic aneurysm 06/08/2021   Benign essential hypertension    Benign prostatic  hyperplasia 01/12/2022   Benign prostatic hyperplasia with lower urinary tract symptoms 01/19/2023   BPH with obstruction/lower urinary tract symptoms    Cardiac murmur 03/31/2021   Chronic respiratory failure with hypoxia (HCC) 09/21/2023   Coronary atherosclerosis 06/08/2021   Diastolic heart failure (HCC) 09/21/2023   DOE (dyspnea on exertion)    Emphysema lung (HCC) 09/21/2023   Encounter for fitting and adjustment of hearing aid 04/19/2022   Encounter for immunization 04/19/2022   Essential (primary) hypertension 01/12/2022   Gastroesophageal reflux disease 01/12/2022   Hyperlipidemia    Idiopathic pulmonary fibrosis (HCC) 01/12/2022   Impacted cerumen, bilateral 04/19/2022   Kidney disease    Obstructive sleep apnea syndrome 04/30/2013   NPSG 01/2012:  AHI 27/hr with desat to 86%.   Other retention of urine 01/19/2023   Paroxysmal atrial fibrillation (HCC) 03/31/2021   Pernicious anemia    Pulmonary fibrosis (HCC) 03/31/2021   Rash 09/21/2023   Seasonal allergies    Secundum atrial septal defect 01/25/2023   Sensorineural hearing loss, bilateral 04/19/2022   Sleep apnea    Vitamin B12 deficiency     Tobacco History: Social History   Tobacco Use  Smoking Status Former   Current packs/day: 0.00   Average packs/day: 1.5 packs/day for 25.0 years (37.5  ttl pk-yrs)   Types: Cigarettes   Start date: 08/26/1958   Quit date: 08/27/1983   Years since quitting: 41.2  Smokeless Tobacco Never  Tobacco Comments   Smoked for about 25 years - about 1ppd   Counseling given: Not Answered Tobacco comments: Smoked for about 25 years - about 1ppd   Outpatient Medications Prior to Visit  Medication Sig Dispense Refill   alfuzosin (UROXATRAL) 10 MG 24 hr tablet Take 10 mg by mouth daily with breakfast.     amiodarone  (PACERONE ) 200 MG tablet Take 2 tablets (400 mg total) TWICE a day for 2 weeks, then take 1 tablet (200 mg total) TWICE a day for 2 weeks, then take 1 tablet (200 mg  total) ONCE a day thereafter (Patient taking differently: Take 200 mg by mouth daily.) 90 tablet 3   apixaban  (ELIQUIS ) 5 MG TABS tablet Take 1 tablet (5 mg total) by mouth 2 (two) times daily. 180 tablet 3   bethanechol (URECHOLINE) 50 MG tablet Take 50 mg by mouth 2 (two) times daily.     Carboxymethylcellulose Sodium (REFRESH LIQUIGEL) 1 % GEL Place 1 drop into the left eye 2 (two) times daily. Additional during the day of needed for dry eye     Cholecalciferol (D3 2000) 50 MCG (2000 UT) CAPS Take 2,000 Units by mouth daily.     Coenzyme Q10 (COQ10) 100 MG CAPS Take 100 mg by mouth daily.     finasteride (PROSCAR) 5 MG tablet Take 5 mg by mouth daily.     fluticasone (FLONASE) 50 MCG/ACT nasal spray Place 1 spray into both nostrils 2 (two) times daily.     furosemide  (LASIX ) 20 MG tablet Take 1 tablet (20 mg total) by mouth 2 (two) times daily. 90 tablet 3   methenamine (HIPREX) 1 g tablet Take 1 g by mouth every morning.     Nintedanib (OFEV ) 100 MG CAPS Take 1 capsule (100 mg total) by mouth daily. 90 capsule 1   pantoprazole (PROTONIX) 40 MG tablet Take 40 mg by mouth 2 (two) times daily.     prednisoLONE acetate (PRED FORTE) 1 % ophthalmic suspension Place 1 drop into the right eye 3 (three) times a week. At bedtime     Probiotic Product (PROBIOTIC DAILY PO) Take 1 tablet by mouth daily.     simvastatin (ZOCOR) 20 MG tablet Take 20 mg by mouth every evening.     telmisartan (MICARDIS) 20 MG tablet Take 10 mg by mouth daily.     Tiotropium Bromide-Olodaterol (STIOLTO RESPIMAT ) 2.5-2.5 MCG/ACT AERS USE 2 INHALATIONS DAILY 3 each 3   triamcinolone  cream (KENALOG ) 0.1 % Apply 1 Application topically 2 (two) times daily.     valACYclovir (VALTREX) 500 MG tablet Take 500 mg by mouth 2 (two) times daily as needed for other (Fever blisters). Fever blisters     No facility-administered medications prior to visit.     Review of Systems:   Constitutional:   No  weight loss, night sweats,   Fevers, chills,+ fatigue, or  lassitude.  HEENT:   No headaches,  Difficulty swallowing,  Tooth/dental problems, or  Sore throat,                No sneezing, itching, ear ache, nasal congestion, post nasal drip,   CV:  No chest pain,  Orthopnea, PND, swelling in lower extremities, anasarca, dizziness, palpitations, syncope.   GI  No heartburn, indigestion, abdominal pain, nausea, vomiting, diarrhea, change in bowel habits, loss of  appetite, bloody stools.   Resp: .  No chest wall deformity  Skin: no rash or lesions.  GU: no dysuria, change in color of urine, no urgency or frequency.  No flank pain, no hematuria   MS:  No joint pain or swelling.  No decreased range of motion.  No back pain.    Physical Exam  BP 130/72   Pulse 73   Temp 97.6 F (36.4 C)   Ht 5' 7 (1.702 m) Comment: Per pt  Wt 171 lb 9.6 oz (77.8 kg)   SpO2 93% Comment: RA  BMI 26.88 kg/m   GEN: A/Ox3; pleasant , NAD, elderly    HEENT:  [place]/AT,  EACs-clear, TMs-wnl, NOSE-clear, THROAT-clear, no lesions, no postnasal drip or exudate noted.   NECK:  Supple w/ fair ROM; no JVD; normal carotid impulses w/o bruits; no thyromegaly or nodules palpated; no lymphadenopathy.    RESP  BB crackles  no accessory muscle use, no dullness to percussion  CARD:  RRR, no m/r/g, no peripheral edema, pulses intact, no cyanosis or clubbing.  GI:   Soft & nt; nml bowel sounds; no organomegaly or masses detected.   Musco: Warm bil, no deformities or joint swelling noted.   Neuro: alert, no focal deficits noted.    Skin: Warm, no lesions or rashes    Lab Results:Reviewed 11/11/2024   CBC    Component Value Date/Time   WBC 9.8 10/22/2024 1116   WBC 7.4 09/11/2024 1505   RBC 3.93 (L) 10/22/2024 1116   RBC 3.94 (L) 09/11/2024 1505   HGB 12.4 (L) 10/22/2024 1116   HCT 38.7 10/22/2024 1116   PLT 179 10/22/2024 1116   MCV 99 (H) 10/22/2024 1116   MCH 31.6 10/22/2024 1116   MCHC 32.0 10/22/2024 1116   MCHC 32.9  09/11/2024 1505   RDW 12.7 10/22/2024 1116   LYMPHSABS 0.9 09/11/2024 1505   LYMPHSABS 1.2 07/23/2024 1156   MONOABS 0.7 09/11/2024 1505   EOSABS 0.2 09/11/2024 1505   EOSABS 0.1 07/23/2024 1156   BASOSABS 0.1 09/11/2024 1505   BASOSABS 0.1 07/23/2024 1156    BMET    Component Value Date/Time   NA 143 10/22/2024 1116   K 4.3 10/22/2024 1116   CL 105 10/22/2024 1116   CO2 22 10/22/2024 1116   GLUCOSE 109 (H) 10/22/2024 1116   GLUCOSE 114 (H) 09/11/2024 1505   BUN 21 10/22/2024 1116   CREATININE 1.41 (H) 10/22/2024 1116   CALCIUM 9.0 10/22/2024 1116    BNP No results found for: BNP  ProBNP    Component Value Date/Time   PROBNP 158.0 (H) 09/11/2024 1505    Imaging: CARDIAC CATHETERIZATION Result Date: 10/16/2024 1. Low filling pressures. 2. Normal PA pressure. 3. Normal cardiac output.  No pulmonary hypertension.    Administration History     None          Latest Ref Rng & Units 12/15/2023    9:11 AM 07/22/2022    1:45 PM 09/03/2021   11:44 AM  PFT Results  FVC-Pre L 3.18  3.37  3.73   FVC-Predicted Pre % 89  94  103   FVC-Post L  3.38  3.70   FVC-Predicted Post %  94  102   Pre FEV1/FVC % % 85  87  86   Post FEV1/FCV % %  88  87   FEV1-Pre L 2.70  2.93  3.20   FEV1-Predicted Pre % 109  116  125   FEV1-Post L  2.96  3.22   DLCO uncorrected ml/min/mmHg 11.20  10.29  13.51   DLCO UNC% % 50  45  59   DLCO corrected ml/min/mmHg 11.20  10.29  13.51   DLCO COR %Predicted % 50  45  59   DLVA Predicted % 69  62  70   TLC L  5.29  5.21   TLC % Predicted %  80  79   RV % Predicted %  58  56     No results found for: NITRICOXIDE      No data to display              Assessment & Plan:   Assessment and Plan Assessment & Plan Idiopathic pulmonary fibrosis and emphysema  -clinically stable  His condition appears stable with no significant changes in scarring on the recent CT scan and no pulmonary hypertension on recent right heart  catheterization. Shortness of breath occurs with exertion but is manageable with rest and activity modification. He tolerates Stiolto inhaler well with no significant side effects, but Ofev  is taken once daily due to side effects. Recent labs showed LFT stable. Oxygen  is used at bedtime and occasionally during the day as needed. Continue Stiolto inhaler, 2 puffs once daily, and Ofev  once daily. Continue oxygen  therapy at bedtime and as needed during the day. Encourage daily physical activity, aiming for 30 minutes of exercise. A breathing test is ordered to be done in 3-4 months. Check PFT on return .   Chronic respiratory failure - stable on O2 .  Continue on O2 with activity If needed, and At bedtime  with CPAP , Keep o2 sats >90%.   Atrial fibrillation-D-CHF   -Ongoing .  His condition is managed with Eliquis  and amiodarone . Recent heart catheterization showed no pulmonary hypertension. He is scheduled for an ablation procedure, . Continue Eliquis  and amiodarone . Continue on daily diuretic.  Proceed with the scheduled ablation procedure and monitor for any complications post-ablation. Keep follow up with cards   Obstructive sleep apnea  -on CPAP -reported as stable  His condition is managed with CPAP therapy at bedtime. . Continue CPAP therapy at bedtime with O2 2l/m. Follow up with [facility].     Plan  Patient Instructions  Continue on Stiolto 2 puffs daily  Activity as tolerated.  Continue on Oxygen  2l/m with activity as needed.   Continue on CPAP with Oxygen  At bedtime   Delsym 2 tsp Twice daily As needed  cough.  Continue on Ofev  100mg   daily  Follow up with Dr. Theophilus in 3 -4  months and As needed  with PFT         Madelin Stank, NP 11/11/2024

## 2024-11-11 NOTE — Patient Instructions (Addendum)
 Continue on Stiolto 2 puffs daily  Activity as tolerated.  Continue on Oxygen  2l/m with activity as needed.   Continue on CPAP with Oxygen  At bedtime   Delsym 2 tsp Twice daily As needed  cough.  Continue on Ofev  100mg   daily  Follow up with Dr. Theophilus in 3 -4  months and As needed  with PFT

## 2024-11-12 NOTE — Pre-Procedure Instructions (Signed)
 Instructed patient on the following items: Arrival time 0830 Nothing to eat or drink after midnight No meds AM of procedure Responsible person to drive you home and stay with you for 24 hrs  Have you missed any doses of anti-coagulant Eliquis - takes twice a day, hasn't missed any doses.  Don't take dose morning of procedure.

## 2024-11-13 ENCOUNTER — Ambulatory Visit (HOSPITAL_COMMUNITY)
Admission: RE | Admit: 2024-11-13 | Discharge: 2024-11-13 | Disposition: A | Discharge status: Home / Self Care | Attending: Cardiology | Admitting: Cardiology

## 2024-11-13 ENCOUNTER — Ambulatory Visit (HOSPITAL_COMMUNITY)

## 2024-11-13 ENCOUNTER — Encounter (HOSPITAL_COMMUNITY)
Admission: RE | Disposition: A | Discharge status: Home / Self Care | Payer: Self-pay | Source: Home / Self Care | Attending: Cardiology

## 2024-11-13 ENCOUNTER — Other Ambulatory Visit: Payer: Self-pay

## 2024-11-13 ENCOUNTER — Encounter (HOSPITAL_COMMUNITY): Payer: Self-pay | Admitting: Cardiology

## 2024-11-13 DIAGNOSIS — Z7901 Long term (current) use of anticoagulants: Secondary | ICD-10-CM | POA: Insufficient documentation

## 2024-11-13 DIAGNOSIS — I4819 Other persistent atrial fibrillation: Secondary | ICD-10-CM | POA: Insufficient documentation

## 2024-11-13 DIAGNOSIS — J449 Chronic obstructive pulmonary disease, unspecified: Secondary | ICD-10-CM | POA: Diagnosis not present

## 2024-11-13 DIAGNOSIS — I10 Essential (primary) hypertension: Secondary | ICD-10-CM | POA: Diagnosis not present

## 2024-11-13 DIAGNOSIS — G4730 Sleep apnea, unspecified: Secondary | ICD-10-CM | POA: Diagnosis not present

## 2024-11-13 DIAGNOSIS — I4891 Unspecified atrial fibrillation: Secondary | ICD-10-CM

## 2024-11-13 DIAGNOSIS — I5032 Chronic diastolic (congestive) heart failure: Secondary | ICD-10-CM | POA: Diagnosis not present

## 2024-11-13 DIAGNOSIS — Z87891 Personal history of nicotine dependence: Secondary | ICD-10-CM | POA: Insufficient documentation

## 2024-11-13 DIAGNOSIS — I110 Hypertensive heart disease with heart failure: Secondary | ICD-10-CM | POA: Insufficient documentation

## 2024-11-13 DIAGNOSIS — I480 Paroxysmal atrial fibrillation: Secondary | ICD-10-CM

## 2024-11-13 DIAGNOSIS — Z9981 Dependence on supplemental oxygen: Secondary | ICD-10-CM | POA: Insufficient documentation

## 2024-11-13 HISTORY — PX: ATRIAL FIBRILLATION ABLATION: EP1191

## 2024-11-13 LAB — POCT ACTIVATED CLOTTING TIME: Activated Clotting Time: 291 s

## 2024-11-13 SURGERY — ATRIAL FIBRILLATION ABLATION
Anesthesia: General

## 2024-11-13 MED ORDER — HEPARIN SODIUM (PORCINE) 1000 UNIT/ML IJ SOLN
INTRAMUSCULAR | Status: AC
  Filled 2024-11-13: qty 10

## 2024-11-13 MED ORDER — SODIUM CHLORIDE 0.9% FLUSH: 3.0000 mL | Freq: Two times a day (BID) | INTRAVENOUS | Status: DC

## 2024-11-13 MED ORDER — PHENYLEPHRINE 80 MCG/ML (10ML) SYRINGE FOR IV PUSH (FOR BLOOD PRESSURE SUPPORT)
PREFILLED_SYRINGE | INTRAVENOUS | Status: DC | PRN
  Administered 2024-11-13 (×2): 160 ug via INTRAVENOUS

## 2024-11-13 MED ORDER — PROPOFOL 10 MG/ML IV BOLUS
INTRAVENOUS | Status: DC | PRN
Start: 2024-11-13 — End: 2024-11-13
  Administered 2024-11-13: 100 mg via INTRAVENOUS

## 2024-11-13 MED ORDER — LIDOCAINE 2% (20 MG/ML) 5 ML SYRINGE
INTRAMUSCULAR | Status: DC | PRN
  Administered 2024-11-13: 60 mg via INTRAVENOUS

## 2024-11-13 MED ORDER — ATROPINE SULFATE 1 MG/10ML IJ SOSY
PREFILLED_SYRINGE | INTRAMUSCULAR | Status: DC | PRN
  Administered 2024-11-13: 1 mg via INTRAVENOUS

## 2024-11-13 MED ORDER — SODIUM CHLORIDE 0.9 % IV SOLN: INTRAVENOUS | Status: DC

## 2024-11-13 MED ORDER — HEPARIN (PORCINE) IN NACL 1000-0.9 UT/500ML-% IV SOLN
INTRAVENOUS | Status: DC | PRN
Start: 2024-11-13 — End: 2024-11-13
  Administered 2024-11-13 (×2): 500 mL

## 2024-11-13 MED ORDER — PROTAMINE SULFATE 10 MG/ML IV SOLN
INTRAVENOUS | Status: DC | PRN
  Administered 2024-11-13: 40 mg via INTRAVENOUS

## 2024-11-13 MED ORDER — HEPARIN SODIUM (PORCINE) 1000 UNIT/ML IJ SOLN
INTRAMUSCULAR | Status: DC | PRN
  Administered 2024-11-13: 1000 [IU] via INTRAVENOUS

## 2024-11-13 MED ORDER — PHENYLEPHRINE HCL-NACL 20-0.9 MG/250ML-% IV SOLN
INTRAVENOUS | Status: DC | PRN
Start: 2024-11-13 — End: 2024-11-13
  Administered 2024-11-13: 40 ug/min via INTRAVENOUS

## 2024-11-13 MED ORDER — ONDANSETRON HCL 4 MG/2ML IJ SOLN: 4.0000 mg | Freq: Four times a day (QID) | INTRAMUSCULAR | Status: DC | PRN

## 2024-11-13 MED ORDER — HEPARIN SODIUM (PORCINE) 1000 UNIT/ML IJ SOLN
INTRAMUSCULAR | Status: DC | PRN
  Administered 2024-11-13: 7000 [IU] via INTRAVENOUS
  Administered 2024-11-13: 14000 [IU] via INTRAVENOUS

## 2024-11-13 MED ORDER — SUGAMMADEX SODIUM 200 MG/2ML IV SOLN
INTRAVENOUS | Status: DC | PRN
  Administered 2024-11-13: 200 mg via INTRAVENOUS

## 2024-11-13 MED ORDER — FENTANYL CITRATE (PF) 100 MCG/2ML IJ SOLN
INTRAMUSCULAR | Status: AC
  Filled 2024-11-13: qty 2

## 2024-11-13 MED ORDER — SODIUM CHLORIDE 0.9 % IV SOLN: 250.0000 mL | INTRAVENOUS | Status: DC | PRN

## 2024-11-13 MED ORDER — FENTANYL CITRATE (PF) 100 MCG/2ML IJ SOLN
INTRAMUSCULAR | Status: DC | PRN
  Administered 2024-11-13: 100 ug via INTRAVENOUS

## 2024-11-13 MED ORDER — ONDANSETRON HCL 4 MG/2ML IJ SOLN
INTRAMUSCULAR | Status: DC | PRN
  Administered 2024-11-13: 4 mg via INTRAVENOUS

## 2024-11-13 MED ORDER — ROCURONIUM BROMIDE 10 MG/ML (PF) SYRINGE
PREFILLED_SYRINGE | INTRAVENOUS | Status: DC | PRN
Start: 2024-11-13 — End: 2024-11-13
  Administered 2024-11-13: 40 mg via INTRAVENOUS

## 2024-11-13 MED ORDER — ACETAMINOPHEN 325 MG PO TABS: 650.0000 mg | ORAL_TABLET | ORAL | Status: DC | PRN

## 2024-11-13 MED ORDER — SODIUM CHLORIDE 0.9% FLUSH: 3.0000 mL | INTRAVENOUS | Status: DC | PRN

## 2024-11-13 SURGICAL SUPPLY — 17 items
BLANKET WARM UNDERBOD FULL ACC (MISCELLANEOUS) ×1 IMPLANT
CABLE VARIPULSE STERILE (CATHETERS) IMPLANT
CATH DECANAV D CURVE (CATHETERS) IMPLANT
CATH GE 8FR SOUNDSTAR (CATHETERS) IMPLANT
CATHETER VARIPULSE 8.5FR (CATHETERS) IMPLANT
CLOSURE MYNX CONTROL 6F/7F (Vascular Products) IMPLANT
CLOSURE PERCLOSE PROSTYLE (Vascular Products) IMPLANT
COVER SWIFTLINK CONNECTOR (BAG) ×1 IMPLANT
KIT VERSACROSS 8.5FR 63 45D (KITS) IMPLANT
PACK EP LF (CUSTOM PROCEDURE TRAY) ×1 IMPLANT
PAD DEFIB RADIO PHYSIO CONN (PAD) ×1 IMPLANT
PATCH CARTO3 (PAD) IMPLANT
SHEATH CARTO VIZIGO SM CVD (SHEATH) IMPLANT
SHEATH PINNACLE 8F 10CM (SHEATH) IMPLANT
SHEATH PINNACLE 9F 10CM (SHEATH) IMPLANT
SHEATH PROBE COVER 6X72 (BAG) IMPLANT
TUBING SMART ABLATE COOLFLOW (TUBING) IMPLANT

## 2024-11-13 NOTE — Transfer of Care (Signed)
 Immediate Anesthesia Transfer of Care Note  Patient: Vincent Wiley  Procedure(s) Performed: ATRIAL FIBRILLATION ABLATION  Patient Location: PACU  Anesthesia Type:General  Level of Consciousness: awake, alert , and oriented  Airway & Oxygen  Therapy: Patient Spontanous Breathing and Patient connected to face mask oxygen   Post-op Assessment: Report given to RN and Post -op Vital signs reviewed and stable  Post vital signs: Reviewed and stable  Last Vitals:  Vitals Value Taken Time  BP 117/69 11/13/24 11:15  Temp    Pulse 73 11/13/24 11:18  Resp 25 11/13/24 11:18  SpO2 96 % 11/13/24 11:18  Vitals shown include unfiled device data.  Last Pain:  Vitals:   11/13/24 0840  TempSrc: Oral         Complications: There were no known notable events for this encounter.

## 2024-11-13 NOTE — H&P (Signed)
  Electrophysiology Office Note:   Date:  11/13/2024  ID:  Vincent Wiley, DOB [birth year], MRN [id number]  Primary Cardiologist: None Primary Heart Failure: None Electrophysiologist: None      History of Present Illness:   Vincent Wiley is a 83 y.o. male with h/o hypertension, coronary atherosclerosis, chronic diastolic heart failure, COPD, pulmonary fibrosis, sleep apnea seen today for  for Electrophysiology evaluation of atrial fibrillation at the request of Rajan Revankar.    He has had atrial fibrillation for a few years.  Initially, he was on amiodarone .  Amiodarone  was stopped due to chronic lung disease.  He does have baseline shortness of breath.  Over the last 4 weeks, his shortness of breath really has gotten significantly worse.  He was daily activities due to his level of fatigue.  Prior to this, he was quite active.  He was able to do his daily activities without any restriction.  Today, denies symptoms of palpitations, chest pain, dyspnea, orthopnea, PND, lower extremity edema, claudication, dizziness, presyncope, syncope, bleeding, or neurologic sequela. The patient is tolerating medications without difficulties. Plan AF ablation today.   EP Information / Studies Reviewed:    EKG is ordered today. Personal review as below.        Risk Assessment/Calculations:    CHA2DS2-VASc Score =     This indicates a  % annual risk of stroke. The patient's score is based upon:              Physical Exam:   VS:  BP (!) 143/85   Pulse 75   Temp 97.6 F (36.4 C) (Oral)   Resp 18   Ht 5' 7 (1.702 m)   Wt 77.6 kg   SpO2 93%   BMI 26.78 kg/m    Wt Readings from Last 3 Encounters:  11/13/24 77.6 kg  11/11/24 77.8 kg  10/16/24 78.9 kg    GEN: Well nourished, well developed in no acute distress NECK: No JVD; No carotid bruits CARDIAC: Regular rate and rhythm, no murmurs, rubs, gallops RESPIRATORY:  Clear to auscultation without rales, wheezing or rhonchi  ABDOMEN: Soft,  non-tender, non-distended EXTREMITIES:  No edema; No deformity    ASSESSMENT AND PLAN:    1.  Persistent atrial fibrillation: Vincent Wiley has presented today for surgery, with the diagnosis of AF.  The various methods of treatment have been discussed with the patient and family. After consideration of risks, benefits and other options for treatment, the patient has consented to  Procedure(s): Catheter ablation as a surgical intervention .  Risks include but not limited to complete heart block, stroke, esophageal damage, nerve damage, bleeding, vascular damage, tamponade, perforation, MI, and death. The patient's history has been reviewed, patient examined, no change in status, stable for surgery.  I have reviewed the patient's chart and labs.  Questions were answered to the patient's satisfaction.    Jesse Hirst Inocencio, MD 11/13/2024 9:27 AM

## 2024-11-13 NOTE — Progress Notes (Signed)
  Pt arrived from EP via Bed to HA 20. Report received from RN and CRNA. Pt vitals are stable, performed q49min see vitals flowsheet. Anesthesia recovery has been uneventful. Pt to transition to short stay for remainder of recovery. Will continue to monitor patient while under holding area care.

## 2024-11-13 NOTE — Discharge Instructions (Signed)

## 2024-11-13 NOTE — Anesthesia Postprocedure Evaluation (Signed)
 Anesthesia Post Note  Patient: Vincent Wiley  Procedure(s) Performed: ATRIAL FIBRILLATION ABLATION     Patient location during evaluation: PACU Anesthesia Type: General Level of consciousness: awake and alert Pain management: pain level controlled Vital Signs Assessment: post-procedure vital signs reviewed and stable Respiratory status: spontaneous breathing, nonlabored ventilation and respiratory function stable Cardiovascular status: blood pressure returned to baseline and stable Postop Assessment: no apparent nausea or vomiting Anesthetic complications: no   There were no known notable events for this encounter.  Last Vitals:  Vitals:   11/13/24 1150 11/13/24 1230  BP: 124/78 (!) 126/94  Pulse: 66 67  Resp: (!) 8 (!) 5  Temp:    SpO2: 94% 96%    Last Pain:  Vitals:   11/13/24 1217  TempSrc:   PainSc: 0-No pain   Pain Goal:                   Butler Levander Pinal

## 2024-11-13 NOTE — Progress Notes (Signed)
 Patient ambulated in the hallway. Bilateral groin sites clean, dry, and intact. No bleeding or hematoma noted. Patient self catheterizes and does not need to at this time. Patient dressed for discharge. No changes to bilateral sites.

## 2024-11-13 NOTE — Anesthesia Procedure Notes (Signed)
 Procedure Name: Intubation Date/Time: 11/13/2024 10:07 AM  Performed by: Alen Motto D, CRNAPre-anesthesia Checklist: Patient identified, Emergency Drugs available, Suction available and Patient being monitored Patient Re-evaluated:Patient Re-evaluated prior to induction Oxygen  Delivery Method: Circle System Utilized Preoxygenation: Pre-oxygenation with 100% oxygen  Induction Type: IV induction Ventilation: Mask ventilation without difficulty Laryngoscope Size: Miller and 3 Grade View: Grade I Tube type: Oral Tube size: 7.5 mm Number of attempts: 1 Airway Equipment and Method: Stylet and Oral airway Placement Confirmation: ETT inserted through vocal cords under direct vision, positive ETCO2 and breath sounds checked- equal and bilateral Secured at: 23 cm Tube secured with: Tape Dental Injury: Teeth and Oropharynx as per pre-operative assessment

## 2024-11-13 NOTE — Anesthesia Preprocedure Evaluation (Addendum)
 Anesthesia Evaluation  Patient identified by MRN, date of birth, ID band Patient awake    Reviewed: Allergy & Precautions, NPO status , Patient's Chart, lab work & pertinent test results  Airway Mallampati: II  TM Distance: >3 FB Neck ROM: Full    Dental no notable dental hx.    Pulmonary sleep apnea and Continuous Positive Airway Pressure Ventilation , COPD,  COPD inhaler and oxygen  dependent, former smoker 3L nasal canula prn Idiopathic pulmonary fibrosis   Pulmonary exam normal breath sounds clear to auscultation       Cardiovascular hypertension, Pt. on medications Normal cardiovascular exam+ dysrhythmias  Rhythm:Regular Rate:Normal  ECHO: 1. Left ventricular ejection fraction, by estimation, is 60 to 65%. The  left ventricle has normal function. The left ventricle has no regional  wall motion abnormalities. There is mild left ventricular hypertrophy.  Left ventricular diastolic parameters  are consistent with Grade II diastolic dysfunction (pseudonormalization).  The average left ventricular global longitudinal strain is -15.1 %. The  global longitudinal strain is abnormal.   2. Right ventricular systolic function is normal. The right ventricular  size is normal. There is moderately elevated pulmonary artery systolic  pressure.   3. Left atrial size was mild to moderately dilated.   4. The mitral valve is normal in structure. Mild mitral valve  regurgitation. No evidence of mitral stenosis.   5. The aortic valve is normal in structure. Aortic valve regurgitation is  mild. Aortic valve sclerosis is present, with no evidence of aortic valve  stenosis.   6. Aneurysm of the ascending aorta, measuring 40 mm.   7. The inferior vena cava is normal in size with greater than 50%  respiratory variability, suggesting right atrial pressure of 3 mmHg.     Neuro/Psych    GI/Hepatic Neg liver ROS,GERD  Medicated and Controlled,,   Endo/Other  negative endocrine ROS    Renal/GU negative Renal ROS     Musculoskeletal negative musculoskeletal ROS (+)    Abdominal   Peds  Hematology  (+) Blood dyscrasia (Eliquis )   Anesthesia Other Findings A-fib  Reproductive/Obstetrics                              Anesthesia Physical Anesthesia Plan  ASA: 4  Anesthesia Plan: General   Post-op Pain Management:    Induction: Intravenous  PONV Risk Score and Plan: 2 and Ondansetron, Dexamethasone and Treatment may vary due to age or medical condition  Airway Management Planned: Oral ETT  Additional Equipment:   Intra-op Plan:   Post-operative Plan: Extubation in OR  Informed Consent: I have reviewed the patients History and Physical, chart, labs and discussed the procedure including the risks, benefits and alternatives for the proposed anesthesia with the patient or authorized representative who has indicated his/her understanding and acceptance.     Dental advisory given  Plan Discussed with: CRNA  Anesthesia Plan Comments:          Anesthesia Quick Evaluation

## 2024-11-14 ENCOUNTER — Telehealth (HOSPITAL_COMMUNITY): Payer: Self-pay

## 2024-11-14 DIAGNOSIS — J84112 Idiopathic pulmonary fibrosis: Secondary | ICD-10-CM | POA: Diagnosis not present

## 2024-11-14 NOTE — Telephone Encounter (Signed)
 Spoke with patient to complete post procedure follow up call.  Patient reports no complications with groin sites.   Instructions reviewed with patient:  Remove large bandage at puncture site after 24 hours. It is normal to have bruising, tenderness, mild swelling, and a pea or marble sized lump/knot at the groin site which can take up to three months to resolve.  Get help right away if you notice sudden swelling at the puncture site.  Check your puncture site every day for signs of infection: fever, redness, swelling, pus drainage, warmth, foul odor or excessive pain. If this occurs, please call [phone], to speak with the RN Navigator. Get help right away if your puncture site is bleeding and the bleeding does not stop after applying firm pressure to the area.  You may continue to have skipped beats/ atrial fibrillation during the first several months after your procedure.  It is very important not to miss any doses of your blood thinner Eliquis .    You will follow up with the Afib clinic 4 weeks after your procedure and follow up with Dr. Inocencio 3 months after your procedure.  Activity restrictions reviewed.  Patient verbalized understanding to all instructions provided.

## 2024-11-20 ENCOUNTER — Telehealth: Payer: Self-pay | Admitting: Cardiology

## 2024-11-20 NOTE — Telephone Encounter (Signed)
 Pt c/o Shortness Of Breath: STAT if SOB developed within the last 24 hours or pt is noticeably SOB on the phone  1. Are you currently SOB (can you hear that pt is SOB on the phone)? yes  2. How long have you been experiencing SOB? 1 week   3. Are you SOB when sitting or when up moving around? Moving around   4. Are you currently experiencing any other symptoms? Lightheaded, chest discomfort

## 2024-11-20 NOTE — Telephone Encounter (Signed)
 Received a call from patient he is having palpitations and sob.Stated he had a recent ablation.He cannot tell it has helped.He wanted to be seen or medications adjusted.Advised I will send message to [facility].

## 2024-11-25 DIAGNOSIS — N401 Enlarged prostate with lower urinary tract symptoms: Secondary | ICD-10-CM | POA: Diagnosis not present

## 2024-11-25 NOTE — Telephone Encounter (Signed)
 Pt reporting continued palpitations and sob. Aware will forward to the afib clinic to see if he can be worked in this week for [employer]

## 2024-11-26 NOTE — Telephone Encounter (Signed)
 Multiple appts offered to patient. Pt agreed to 12/10.

## 2024-12-04 ENCOUNTER — Ambulatory Visit (HOSPITAL_COMMUNITY)
Admission: RE | Admit: 2024-12-04 | Discharge: 2024-12-04 | Disposition: A | Discharge status: Home / Self Care | Source: Ambulatory Visit | Attending: Physician Assistant | Admitting: Physician Assistant

## 2024-12-04 ENCOUNTER — Ambulatory Visit (HOSPITAL_COMMUNITY)
Admission: RE | Admit: 2024-12-04 | Discharge: 2024-12-04 | Attending: Physician Assistant | Admitting: Physician Assistant

## 2024-12-04 VITALS — BP 122/60 | HR 75 | Ht 67.0 in | Wt 169.0 lb

## 2024-12-04 DIAGNOSIS — I4819 Other persistent atrial fibrillation: Secondary | ICD-10-CM

## 2024-12-04 DIAGNOSIS — I4891 Unspecified atrial fibrillation: Secondary | ICD-10-CM

## 2024-12-04 DIAGNOSIS — D6869 Other thrombophilia: Secondary | ICD-10-CM | POA: Diagnosis not present

## 2024-12-04 NOTE — Progress Notes (Signed)
 Primary Care Physician: Street, Lonni HERO, MD Primary Cardiologist: Alean SAUNDERS Madireddy, MD Electrophysiologist: Will Gladis Norton, MD  Referring Physician: Dr Norton   Vincent Wiley is a 83 y.o. male with a history of HTN, CAD, COPD, pulmonary fibrosis, OSA, CHF, atrial fibrillation who presents for follow up in the [facility]. Initially, he was on amiodarone . Amiodarone  was stopped due to chronic lung disease. He was seen by Dr Norton and underwent afib ablation on 11/13/24. Patient is on Eliquis  for stroke prevention.    Patient presents today for follow up for atrial fibrillation. He is in SR today. However, he reports that he feels worse post ablation. His SOB with exertion as worsened. He also reports tachypalpitations when his O2 sats drop to upper 80s. He denies significant chest pain or groin issues. No bleeding issues on anticoagulation.   Today, he denies symptoms of orthopnea, PND, lower extremity edema, dizziness, presyncope, syncope, bleeding, or neurologic sequela. The patient is tolerating medications without difficulties and is otherwise without complaint today.    Atrial Fibrillation Risk Factors:  he does have symptoms or diagnosis of sleep apnea. he does not have a history of rheumatic fever.   Atrial Fibrillation Management history:  Previous antiarrhythmic drugs: amiodarone   Previous cardioversions: none Previous ablations: 11/13/24 Anticoagulation history: Eliquis   ROS- All systems are reviewed and negative except as per the HPI above.  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    Chronic GI losses, B12 Deficiency   Ascending aortic aneurysm 06/08/2021   Benign essential hypertension    Benign prostatic hyperplasia 01/12/2022   Benign prostatic hyperplasia with lower urinary tract symptoms 01/19/2023   BPH with obstruction/lower urinary  tract symptoms    Cardiac murmur 03/31/2021   Chronic respiratory failure with hypoxia (HCC) 09/21/2023   Coronary atherosclerosis 06/08/2021   Diastolic heart failure (HCC) 09/21/2023   DOE (dyspnea on exertion)    Emphysema lung (HCC) 09/21/2023   Encounter for fitting and adjustment of hearing aid 04/19/2022   Encounter for immunization 04/19/2022   Essential (primary) hypertension 01/12/2022   Gastroesophageal reflux disease 01/12/2022   Hyperlipidemia    Idiopathic pulmonary fibrosis (HCC) 01/12/2022   Impacted cerumen, bilateral 04/19/2022   Kidney disease    Obstructive sleep apnea syndrome 04/30/2013   NPSG 01/2012:  AHI 27/hr with desat to 86%.   Other retention of urine 01/19/2023   Paroxysmal atrial fibrillation (HCC) 03/31/2021   Pernicious anemia    Pulmonary fibrosis (HCC) 03/31/2021   Rash 09/21/2023   Seasonal allergies    Secundum atrial septal defect 01/25/2023   Sensorineural hearing loss, bilateral 04/19/2022   Sleep apnea    Vitamin B12 deficiency     Current Outpatient Medications  Medication Sig Dispense Refill   alfuzosin (UROXATRAL) 10 MG 24 hr tablet Take 10 mg by mouth daily with breakfast.     amiodarone  (PACERONE ) 200 MG tablet Take 2 tablets (400 mg total) TWICE a day for 2 weeks, then take 1 tablet (200 mg total) TWICE a day for 2 weeks, then take 1 tablet (200 mg total) ONCE a day thereafter (Patient taking differently: Take 200 mg by mouth daily.) 90 tablet 3   apixaban  (ELIQUIS ) 5 MG TABS tablet Take 1 tablet (5 mg total) by mouth 2 (two) times daily. 180 tablet 3   bethanechol (URECHOLINE) 50 MG tablet Take 50 mg by mouth 2 (  two) times daily.     Carboxymethylcellulose Sodium (REFRESH LIQUIGEL) 1 % GEL Place 1 drop into the left eye 2 (two) times daily. Additional during the day of needed for dry eye     Cholecalciferol (D3 2000) 50 MCG (2000 UT) CAPS Take 2,000 Units by mouth daily.     Coenzyme Q10 (COQ10) 100 MG CAPS Take 100 mg by mouth  daily.     finasteride (PROSCAR) 5 MG tablet Take 5 mg by mouth daily.     fluticasone (FLONASE) 50 MCG/ACT nasal spray Place 1 spray into both nostrils 2 (two) times daily.     furosemide  (LASIX ) 20 MG tablet Take 1 tablet (20 mg total) by mouth 2 (two) times daily. (Patient taking differently: Take 20 mg by mouth daily.) 90 tablet 3   methenamine (HIPREX) 1 g tablet Take 1 g by mouth every morning.     Nintedanib (OFEV ) 100 MG CAPS Take 1 capsule (100 mg total) by mouth daily. 90 capsule 1   pantoprazole (PROTONIX) 40 MG tablet Take 40 mg by mouth 2 (two) times daily.     prednisoLONE acetate (PRED FORTE) 1 % ophthalmic suspension Place 1 drop into the right eye 3 (three) times a week. At bedtime     Probiotic Product (PROBIOTIC DAILY PO) Take 1 tablet by mouth daily.     simvastatin (ZOCOR) 20 MG tablet Take 20 mg by mouth every evening.     telmisartan (MICARDIS) 20 MG tablet Take 10 mg by mouth daily.     Tiotropium Bromide-Olodaterol (STIOLTO RESPIMAT ) 2.5-2.5 MCG/ACT AERS USE 2 INHALATIONS DAILY 3 each 3   triamcinolone  cream (KENALOG ) 0.1 % Apply 1 Application topically 2 (two) times daily.     valACYclovir (VALTREX) 500 MG tablet Take 500 mg by mouth 2 (two) times daily as needed for other (Fever blisters). Fever blisters     No current facility-administered medications for this encounter.    Physical Exam: BP 122/60   Pulse 75   Ht 5' 7 (1.702 m)   Wt 76.7 kg   BMI 26.47 kg/m   GEN: Well nourished, well developed in no acute distress CARDIAC: Regular rate and rhythm, no murmurs, rubs, gallops RESPIRATORY:  Clear to auscultation without rales, wheezing or rhonchi  ABDOMEN: Soft, non-tender, non-distended EXTREMITIES:  No edema; No deformity   Wt Readings from Last 3 Encounters:  12/04/24 76.7 kg  11/13/24 77.6 kg  11/11/24 77.8 kg     EKG Interpretation Date/Time:  Wednesday December 04 2024 08:54:11 EST Ventricular Rate:  75 PR Interval:  170 QRS  Duration:  108 QT Interval:  400 QTC Calculation: 446 R Axis:   48  Text Interpretation: Normal sinus rhythm Nonspecific ST abnormality Abnormal ECG When compared with ECG of 13-Nov-2024 11:25, No significant change was found Confirmed by Shenita Trego ([id number]) on 12/04/2024 9:01:29 AM    Echo 03/26/24 demonstrated   1. Left ventricular ejection fraction, by estimation, is 60 to 65%. The  left ventricle has normal function. The left ventricle has no regional  wall motion abnormalities. There is mild left ventricular hypertrophy.  Left ventricular diastolic parameters are consistent with Grade II diastolic dysfunction (pseudonormalization). The average left ventricular global longitudinal strain is -15.1 %. The global longitudinal strain is abnormal.   2. Right ventricular systolic function is normal. The right ventricular  size is normal. There is moderately elevated pulmonary artery systolic  pressure.   3. Left atrial size was mild to moderately dilated.   4.  The mitral valve is normal in structure. Mild mitral valve  regurgitation. No evidence of mitral stenosis.   5. The aortic valve is normal in structure. Aortic valve regurgitation is  mild. Aortic valve sclerosis is present, with no evidence of aortic valve  stenosis.   6. Aneurysm of the ascending aorta, measuring 40 mm.   7. The inferior vena cava is normal in size with greater than 50%  respiratory variability, suggesting right atrial pressure of 3 mmHg.    CHA2DS2-VASc Score = 5  The patient's score is based upon: CHF History: 1 HTN History: 1 Diabetes History: 0 Stroke History: 0 Vascular Disease History: 1 Age Score: 2 Gender Score: 0       ASSESSMENT AND PLAN: Persistent Atrial Fibrillation (ICD10:  I48.19) The patient's CHA2DS2-VASc score is 5, indicating a 7.2% annual risk of stroke.   S/p afib ablation 11/13/24 He is in SR today. However, he is having increased symptoms of SOB and heart racing. Unclear if  symptoms are due to afib or lung function.  Will have him wear a 7 day Zio monitor to evaluate.  Given his pulmonary fibrosis and increased O2 requirement, will stop amiodarone  today.  Continue Eliquis  5 mg BID with no missed doses for 3 months post ablation.   Secondary Hypercoagulable State (ICD10:  D68.69) The patient is at significant risk for stroke/thromboembolism based upon his CHA2DS2-VASc Score of 5.  Continue Apixaban  (Eliquis ). No bleeding issues.   HTN Stable on current regimen  CAD No anginal symptoms Followed by Dr Madireddy  Chronic HFpEF EF 60-65% GDMT per primary cardiology team Fluid status appears stable today   Follow up with Dr Inocencio as scheduled.     [facility] [address] [place], [place] [address] [phone]

## 2024-12-04 NOTE — Patient Instructions (Signed)
 Stop amiodarone

## 2024-12-05 DIAGNOSIS — E538 Deficiency of other specified B group vitamins: Secondary | ICD-10-CM | POA: Diagnosis not present

## 2024-12-11 ENCOUNTER — Ambulatory Visit (HOSPITAL_COMMUNITY): Admitting: Physician Assistant

## 2024-12-23 ENCOUNTER — Ambulatory Visit (HOSPITAL_COMMUNITY): Payer: Self-pay | Admitting: Physician Assistant

## 2025-02-24 ENCOUNTER — Ambulatory Visit: Admitting: Cardiology

## 2025-02-25 ENCOUNTER — Ambulatory Visit: Admitting: Pulmonary Disease

## 2025-02-25 ENCOUNTER — Encounter

## 2025-03-03 ENCOUNTER — Ambulatory Visit
